# Patient Record
Sex: Female | Born: 1975 | Race: Black or African American | Hispanic: No | Marital: Married | Smoking: Former smoker
Health system: Southern US, Community
[De-identification: ages and names within clinical notes are randomized; demographics above are authoritative.]

## PROBLEM LIST (undated history)

## (undated) DIAGNOSIS — E559 Vitamin D deficiency, unspecified: Secondary | ICD-10-CM

## (undated) DIAGNOSIS — O139 Gestational [pregnancy-induced] hypertension without significant proteinuria, unspecified trimester: Secondary | ICD-10-CM

## (undated) DIAGNOSIS — R87629 Unspecified abnormal cytological findings in specimens from vagina: Secondary | ICD-10-CM

## (undated) DIAGNOSIS — F419 Anxiety disorder, unspecified: Secondary | ICD-10-CM

## (undated) DIAGNOSIS — M1990 Unspecified osteoarthritis, unspecified site: Secondary | ICD-10-CM

## (undated) DIAGNOSIS — E7800 Pure hypercholesterolemia, unspecified: Secondary | ICD-10-CM

## (undated) HISTORY — PX: LAPAROSCOPY: SHX197

## (undated) HISTORY — PX: BREAST BIOPSY: SHX20

## (undated) HISTORY — DX: Unspecified osteoarthritis, unspecified site: M19.90

## (undated) HISTORY — PX: TUBAL LIGATION: SHX77

## (undated) HISTORY — DX: Gestational (pregnancy-induced) hypertension without significant proteinuria, unspecified trimester: O13.9

## (undated) HISTORY — PX: KNEE ARTHROSCOPY WITH ANTERIOR CRUCIATE LIGAMENT (ACL) REPAIR: SHX5644

## (undated) HISTORY — DX: Anxiety disorder, unspecified: F41.9

## (undated) HISTORY — DX: Unspecified abnormal cytological findings in specimens from vagina: R87.629

## (undated) HISTORY — PX: ANTERIOR CRUCIATE LIGAMENT REPAIR: SHX115

## (undated) HISTORY — PX: BILATERAL SALPINGECTOMY: SHX5743

---

## 1998-09-21 ENCOUNTER — Emergency Department (HOSPITAL_COMMUNITY): Admission: EM | Admit: 1998-09-21 | Discharge: 1998-09-21 | Payer: Self-pay | Admitting: Emergency Medicine

## 1998-09-21 ENCOUNTER — Encounter: Payer: Self-pay | Admitting: Emergency Medicine

## 1998-12-11 ENCOUNTER — Emergency Department (HOSPITAL_COMMUNITY): Admission: EM | Admit: 1998-12-11 | Discharge: 1998-12-11 | Payer: Self-pay | Admitting: *Deleted

## 1998-12-11 ENCOUNTER — Encounter: Payer: Self-pay | Admitting: Emergency Medicine

## 1999-05-27 ENCOUNTER — Other Ambulatory Visit: Admission: RE | Admit: 1999-05-27 | Discharge: 1999-05-27 | Payer: Self-pay | Admitting: Obstetrics

## 1999-10-07 ENCOUNTER — Emergency Department (HOSPITAL_COMMUNITY): Admission: EM | Admit: 1999-10-07 | Discharge: 1999-10-07 | Payer: Self-pay | Admitting: Emergency Medicine

## 2000-01-17 ENCOUNTER — Encounter: Payer: Self-pay | Admitting: Emergency Medicine

## 2000-01-17 ENCOUNTER — Emergency Department (HOSPITAL_COMMUNITY): Admission: EM | Admit: 2000-01-17 | Discharge: 2000-01-17 | Payer: Self-pay | Admitting: Emergency Medicine

## 2000-08-14 ENCOUNTER — Emergency Department (HOSPITAL_COMMUNITY): Admission: EM | Admit: 2000-08-14 | Discharge: 2000-08-14 | Payer: Self-pay | Admitting: Emergency Medicine

## 2000-08-21 ENCOUNTER — Emergency Department (HOSPITAL_COMMUNITY): Admission: EM | Admit: 2000-08-21 | Discharge: 2000-08-21 | Payer: Self-pay | Admitting: Emergency Medicine

## 2001-08-22 ENCOUNTER — Emergency Department (HOSPITAL_COMMUNITY): Admission: EM | Admit: 2001-08-22 | Discharge: 2001-08-22 | Payer: Self-pay | Admitting: Emergency Medicine

## 2001-08-23 ENCOUNTER — Encounter: Payer: Self-pay | Admitting: Emergency Medicine

## 2001-08-27 ENCOUNTER — Encounter: Payer: Self-pay | Admitting: Urology

## 2001-08-27 ENCOUNTER — Encounter: Admission: RE | Admit: 2001-08-27 | Discharge: 2001-08-27 | Payer: Self-pay | Admitting: Urology

## 2002-10-21 ENCOUNTER — Emergency Department (HOSPITAL_COMMUNITY): Admission: EM | Admit: 2002-10-21 | Discharge: 2002-10-21 | Payer: Self-pay | Admitting: Emergency Medicine

## 2002-10-25 ENCOUNTER — Encounter: Payer: Self-pay | Admitting: Emergency Medicine

## 2002-10-25 ENCOUNTER — Emergency Department (HOSPITAL_COMMUNITY): Admission: EM | Admit: 2002-10-25 | Discharge: 2002-10-25 | Payer: Self-pay | Admitting: Emergency Medicine

## 2002-11-12 ENCOUNTER — Emergency Department (HOSPITAL_COMMUNITY): Admission: EM | Admit: 2002-11-12 | Discharge: 2002-11-12 | Payer: Self-pay | Admitting: Emergency Medicine

## 2002-11-12 ENCOUNTER — Encounter: Payer: Self-pay | Admitting: Emergency Medicine

## 2002-11-14 ENCOUNTER — Encounter: Admission: RE | Admit: 2002-11-14 | Discharge: 2002-11-14 | Payer: Self-pay | Admitting: Obstetrics and Gynecology

## 2002-11-24 ENCOUNTER — Encounter: Payer: Self-pay | Admitting: Family Medicine

## 2002-11-24 ENCOUNTER — Inpatient Hospital Stay (HOSPITAL_COMMUNITY): Admission: AD | Admit: 2002-11-24 | Discharge: 2002-11-24 | Payer: Self-pay | Admitting: Family Medicine

## 2002-11-26 ENCOUNTER — Encounter: Admission: RE | Admit: 2002-11-26 | Discharge: 2002-11-26 | Payer: Self-pay | Admitting: Obstetrics and Gynecology

## 2002-12-16 ENCOUNTER — Ambulatory Visit (HOSPITAL_COMMUNITY): Admission: RE | Admit: 2002-12-16 | Discharge: 2002-12-16 | Payer: Self-pay | Admitting: Family Medicine

## 2003-04-22 ENCOUNTER — Encounter (INDEPENDENT_AMBULATORY_CARE_PROVIDER_SITE_OTHER): Payer: Self-pay | Admitting: Specialist

## 2003-04-22 ENCOUNTER — Encounter: Admission: RE | Admit: 2003-04-22 | Discharge: 2003-04-22 | Payer: Self-pay | Admitting: Obstetrics and Gynecology

## 2003-05-14 ENCOUNTER — Inpatient Hospital Stay (HOSPITAL_COMMUNITY): Admission: AD | Admit: 2003-05-14 | Discharge: 2003-05-14 | Payer: Self-pay | Admitting: Obstetrics and Gynecology

## 2003-07-11 ENCOUNTER — Inpatient Hospital Stay (HOSPITAL_COMMUNITY): Admission: AD | Admit: 2003-07-11 | Discharge: 2003-07-11 | Payer: Self-pay | Admitting: Obstetrics

## 2003-08-04 ENCOUNTER — Emergency Department (HOSPITAL_COMMUNITY): Admission: EM | Admit: 2003-08-04 | Discharge: 2003-08-04 | Payer: Self-pay | Admitting: Emergency Medicine

## 2003-08-06 ENCOUNTER — Emergency Department (HOSPITAL_COMMUNITY): Admission: EM | Admit: 2003-08-06 | Discharge: 2003-08-06 | Payer: Self-pay | Admitting: Emergency Medicine

## 2003-09-18 ENCOUNTER — Ambulatory Visit (HOSPITAL_COMMUNITY): Admission: RE | Admit: 2003-09-18 | Discharge: 2003-09-18 | Payer: Self-pay | Admitting: Obstetrics

## 2003-10-19 ENCOUNTER — Observation Stay (HOSPITAL_COMMUNITY): Admission: AD | Admit: 2003-10-19 | Discharge: 2003-10-20 | Payer: Self-pay | Admitting: Obstetrics

## 2003-10-19 ENCOUNTER — Encounter: Payer: Self-pay | Admitting: General Surgery

## 2003-11-08 ENCOUNTER — Inpatient Hospital Stay (HOSPITAL_COMMUNITY): Admission: AD | Admit: 2003-11-08 | Discharge: 2003-11-10 | Payer: Self-pay | Admitting: Obstetrics

## 2004-01-13 ENCOUNTER — Ambulatory Visit (HOSPITAL_COMMUNITY): Admission: RE | Admit: 2004-01-13 | Discharge: 2004-01-13 | Payer: Self-pay | Admitting: Obstetrics

## 2004-02-18 ENCOUNTER — Emergency Department (HOSPITAL_COMMUNITY): Admission: EM | Admit: 2004-02-18 | Discharge: 2004-02-18 | Payer: Self-pay | Admitting: Family Medicine

## 2004-02-28 ENCOUNTER — Emergency Department (HOSPITAL_COMMUNITY): Admission: EM | Admit: 2004-02-28 | Discharge: 2004-02-28 | Payer: Self-pay | Admitting: Family Medicine

## 2004-03-12 ENCOUNTER — Emergency Department (HOSPITAL_COMMUNITY): Admission: EM | Admit: 2004-03-12 | Discharge: 2004-03-12 | Payer: Self-pay | Admitting: Emergency Medicine

## 2004-08-20 ENCOUNTER — Emergency Department (HOSPITAL_COMMUNITY): Admission: EM | Admit: 2004-08-20 | Discharge: 2004-08-20 | Payer: Self-pay | Admitting: Emergency Medicine

## 2004-09-01 ENCOUNTER — Emergency Department (HOSPITAL_COMMUNITY): Admission: EM | Admit: 2004-09-01 | Discharge: 2004-09-01 | Payer: Self-pay | Admitting: Family Medicine

## 2005-02-26 ENCOUNTER — Emergency Department (HOSPITAL_COMMUNITY): Admission: EM | Admit: 2005-02-26 | Discharge: 2005-02-26 | Payer: Self-pay | Admitting: Emergency Medicine

## 2005-03-11 ENCOUNTER — Encounter: Payer: Self-pay | Admitting: *Deleted

## 2005-03-11 ENCOUNTER — Observation Stay (HOSPITAL_COMMUNITY): Admission: AD | Admit: 2005-03-11 | Discharge: 2005-03-12 | Payer: Self-pay | Admitting: Obstetrics

## 2005-06-14 ENCOUNTER — Emergency Department (HOSPITAL_COMMUNITY): Admission: EM | Admit: 2005-06-14 | Discharge: 2005-06-14 | Payer: Self-pay | Admitting: Family Medicine

## 2006-06-09 ENCOUNTER — Inpatient Hospital Stay (HOSPITAL_COMMUNITY): Admission: AD | Admit: 2006-06-09 | Discharge: 2006-06-09 | Payer: Self-pay | Admitting: Obstetrics

## 2006-08-03 ENCOUNTER — Inpatient Hospital Stay (HOSPITAL_COMMUNITY): Admission: AD | Admit: 2006-08-03 | Discharge: 2006-08-03 | Payer: Self-pay | Admitting: Obstetrics

## 2007-01-24 ENCOUNTER — Emergency Department (HOSPITAL_COMMUNITY): Admission: EM | Admit: 2007-01-24 | Discharge: 2007-01-25 | Payer: Self-pay | Admitting: Emergency Medicine

## 2007-03-01 ENCOUNTER — Emergency Department (HOSPITAL_COMMUNITY): Admission: EM | Admit: 2007-03-01 | Discharge: 2007-03-01 | Payer: Self-pay | Admitting: Emergency Medicine

## 2007-03-08 ENCOUNTER — Encounter: Admission: RE | Admit: 2007-03-08 | Discharge: 2007-03-08 | Payer: Self-pay | Admitting: General Surgery

## 2007-03-19 ENCOUNTER — Emergency Department (HOSPITAL_COMMUNITY): Admission: EM | Admit: 2007-03-19 | Discharge: 2007-03-19 | Payer: Self-pay | Admitting: Emergency Medicine

## 2007-12-12 ENCOUNTER — Emergency Department (HOSPITAL_COMMUNITY): Admission: EM | Admit: 2007-12-12 | Discharge: 2007-12-12 | Payer: Self-pay | Admitting: Emergency Medicine

## 2008-04-14 ENCOUNTER — Ambulatory Visit (HOSPITAL_COMMUNITY): Admission: RE | Admit: 2008-04-14 | Discharge: 2008-04-14 | Payer: Self-pay | Admitting: General Surgery

## 2008-09-28 ENCOUNTER — Emergency Department (HOSPITAL_COMMUNITY): Admission: EM | Admit: 2008-09-28 | Discharge: 2008-09-28 | Payer: Self-pay | Admitting: Emergency Medicine

## 2009-02-11 ENCOUNTER — Inpatient Hospital Stay (HOSPITAL_COMMUNITY): Admission: AD | Admit: 2009-02-11 | Discharge: 2009-02-11 | Payer: Self-pay | Admitting: Obstetrics

## 2009-03-22 ENCOUNTER — Emergency Department (HOSPITAL_COMMUNITY): Admission: EM | Admit: 2009-03-22 | Discharge: 2009-03-22 | Payer: Self-pay | Admitting: Emergency Medicine

## 2009-05-02 ENCOUNTER — Emergency Department (HOSPITAL_COMMUNITY): Admission: EM | Admit: 2009-05-02 | Discharge: 2009-05-02 | Payer: Self-pay | Admitting: Emergency Medicine

## 2009-09-29 ENCOUNTER — Emergency Department (HOSPITAL_COMMUNITY): Admission: EM | Admit: 2009-09-29 | Discharge: 2009-09-29 | Payer: Self-pay | Admitting: Emergency Medicine

## 2009-12-13 ENCOUNTER — Emergency Department (HOSPITAL_COMMUNITY): Admission: EM | Admit: 2009-12-13 | Discharge: 2009-12-13 | Payer: Self-pay | Admitting: Emergency Medicine

## 2010-02-09 ENCOUNTER — Ambulatory Visit (HOSPITAL_BASED_OUTPATIENT_CLINIC_OR_DEPARTMENT_OTHER): Admission: RE | Admit: 2010-02-09 | Discharge: 2010-02-09 | Payer: Self-pay | Admitting: Orthopedic Surgery

## 2010-02-24 ENCOUNTER — Encounter: Admission: RE | Admit: 2010-02-24 | Discharge: 2010-03-05 | Payer: Self-pay | Admitting: Orthopedic Surgery

## 2010-04-06 ENCOUNTER — Encounter: Admission: RE | Admit: 2010-04-06 | Discharge: 2010-05-13 | Payer: Self-pay | Admitting: Orthopedic Surgery

## 2010-10-22 ENCOUNTER — Emergency Department (HOSPITAL_COMMUNITY)
Admission: EM | Admit: 2010-10-22 | Discharge: 2010-10-22 | Payer: Self-pay | Source: Home / Self Care | Admitting: Emergency Medicine

## 2010-10-25 LAB — POCT I-STAT, CHEM 8
BUN: 9 mg/dL (ref 6–23)
Calcium, Ion: 1.19 mmol/L (ref 1.12–1.32)
Chloride: 104 mEq/L (ref 96–112)
Creatinine, Ser: 0.8 mg/dL (ref 0.4–1.2)
Glucose, Bld: 97 mg/dL (ref 70–99)
HCT: 42 % (ref 36.0–46.0)
Hemoglobin: 14.3 g/dL (ref 12.0–15.0)
Potassium: 3.5 mEq/L (ref 3.5–5.1)
Sodium: 140 mEq/L (ref 135–145)
TCO2: 27 mmol/L (ref 0–100)

## 2010-10-25 LAB — DIFFERENTIAL
Basophils Absolute: 0.1 10*3/uL (ref 0.0–0.1)
Basophils Relative: 1 % (ref 0–1)
Eosinophils Absolute: 0.4 10*3/uL (ref 0.0–0.7)
Eosinophils Relative: 5 % (ref 0–5)
Lymphocytes Relative: 36 % (ref 12–46)
Lymphs Abs: 3.3 10*3/uL (ref 0.7–4.0)
Monocytes Absolute: 0.8 10*3/uL (ref 0.1–1.0)
Monocytes Relative: 8 % (ref 3–12)
Neutro Abs: 4.7 10*3/uL (ref 1.7–7.7)
Neutrophils Relative %: 51 % (ref 43–77)

## 2010-10-25 LAB — CBC
HCT: 38.2 % (ref 36.0–46.0)
Hemoglobin: 13.2 g/dL (ref 12.0–15.0)
MCH: 28.4 pg (ref 26.0–34.0)
MCHC: 34.6 g/dL (ref 30.0–36.0)
MCV: 82.3 fL (ref 78.0–100.0)
Platelets: 253 10*3/uL (ref 150–400)
RBC: 4.64 MIL/uL (ref 3.87–5.11)
RDW: 14.1 % (ref 11.5–15.5)
WBC: 9.2 10*3/uL (ref 4.0–10.5)

## 2010-10-25 LAB — URINALYSIS, ROUTINE W REFLEX MICROSCOPIC
Bilirubin Urine: NEGATIVE
Hgb urine dipstick: NEGATIVE
Ketones, ur: NEGATIVE mg/dL
Nitrite: NEGATIVE
Protein, ur: NEGATIVE mg/dL
Specific Gravity, Urine: 1.024 (ref 1.005–1.030)
Urine Glucose, Fasting: NEGATIVE mg/dL
Urobilinogen, UA: 1 mg/dL (ref 0.0–1.0)
pH: 6.5 (ref 5.0–8.0)

## 2010-10-25 LAB — WET PREP, GENITAL: Yeast Wet Prep HPF POC: NONE SEEN

## 2010-10-25 LAB — URINE MICROSCOPIC-ADD ON

## 2010-10-25 LAB — POCT PREGNANCY, URINE: Preg Test, Ur: NEGATIVE

## 2010-10-26 LAB — GC/CHLAMYDIA PROBE AMP, GENITAL
Chlamydia, DNA Probe: NEGATIVE
GC Probe Amp, Genital: POSITIVE — AB

## 2011-01-03 LAB — RAPID STREP SCREEN (MED CTR MEBANE ONLY): Streptococcus, Group A Screen (Direct): NEGATIVE

## 2011-01-09 LAB — URINALYSIS, ROUTINE W REFLEX MICROSCOPIC
Glucose, UA: NEGATIVE mg/dL
pH: 5.5 (ref 5.0–8.0)

## 2011-01-09 LAB — RAPID URINE DRUG SCREEN, HOSP PERFORMED
Barbiturates: NOT DETECTED
Benzodiazepines: NOT DETECTED
Cocaine: NOT DETECTED
Opiates: NOT DETECTED

## 2011-01-09 LAB — CBC
Hemoglobin: 13.8 g/dL (ref 12.0–15.0)
MCHC: 33.7 g/dL (ref 30.0–36.0)
MCV: 87.6 fL (ref 78.0–100.0)
RDW: 14.1 % (ref 11.5–15.5)

## 2011-01-09 LAB — POCT I-STAT, CHEM 8
Calcium, Ion: 1.11 mmol/L — ABNORMAL LOW (ref 1.12–1.32)
Creatinine, Ser: 0.7 mg/dL (ref 0.4–1.2)
Glucose, Bld: 95 mg/dL (ref 70–99)
Hemoglobin: 15.3 g/dL — ABNORMAL HIGH (ref 12.0–15.0)
TCO2: 22 mmol/L (ref 0–100)

## 2011-01-09 LAB — DIFFERENTIAL
Monocytes Relative: 8 % (ref 3–12)
Neutro Abs: 9.3 10*3/uL — ABNORMAL HIGH (ref 1.7–7.7)

## 2011-01-11 LAB — CBC
MCHC: 34.6 g/dL (ref 30.0–36.0)
MCV: 89.2 fL (ref 78.0–100.0)
Platelets: 272 10*3/uL (ref 150–400)
RBC: 4.89 MIL/uL (ref 3.87–5.11)

## 2011-01-11 LAB — WET PREP, GENITAL: Clue Cells Wet Prep HPF POC: NONE SEEN

## 2011-01-11 LAB — URINALYSIS, ROUTINE W REFLEX MICROSCOPIC
Glucose, UA: NEGATIVE mg/dL
Ketones, ur: NEGATIVE mg/dL
Protein, ur: NEGATIVE mg/dL

## 2011-01-11 LAB — URINE MICROSCOPIC-ADD ON

## 2011-02-18 NOTE — Op Note (Signed)
Brittany Smith, Brittany Smith                        ACCOUNT NO.:  [id number]   MEDICAL RECORD NO.:  [id number]                   PATIENT TYPE:  AMB   LOCATION:  SDC                                  FACILITY:  [facility]   PHYSICIAN:  Kathreen Cosier, M.D.           DATE OF BIRTH:  December 22, 1975   DATE OF PROCEDURE:  01/13/2004  DATE OF DISCHARGE:                                 OPERATIVE REPORT   PROCEDURE:  Open laparoscopic tubal sterilization.   DESCRIPTION OF PROCEDURE:  Under general anesthesia the patient in the  lithotomy position, the abdomen, perineum and vagina prepped and draped,  bladder emptied with a straight catheter. A weighted speculum placed in the  vagina and the cervix grasped with a Hulka tenaculum. In the umbilicus a  transverse incision made and carried out through the fascia. The fascia  cleaned, grasped with two Kocher's and the fascia and the peritoneum opened  with Mayo scissors. The sleeve of the trocar inserted intraperitoneally with  3 liters carbon dioxide infused intraperitoneally.  The visual __________  scope inserted. The uterus, tubes and ovaries normal.  Culture probe  inserted through the sleeve and scope. The right tube grasped at one inch  from the __________ and cauterized.  The tube cauterized in a total of four  places moving laterally from the first site of culture.  Procedure done in a  similar fashion on the other side.  The probe was removed, CO2 allowed to  escape from the peritoneal cavity. The fascia closed with one suture of #0  Dexon and the skin closed with subcuticular stitch of 4-0 Monocryl.  The  patient tolerated the procedure well and was taken to the recovery room in  good condition.                                               Kathreen Cosier, M.D.    BAM/MEDQ  D:  01/13/2004  T:  01/13/2004  Job:  [id number]

## 2011-02-18 NOTE — Op Note (Signed)
Brittany Smith, Brittany Smith                        ACCOUNT NO.:  [id number]   MEDICAL RECORD NO.:  [id number]                   PATIENT TYPE:  AMB   LOCATION:  SDC                                  [facility]:  [facility]   PHYSICIAN:  Phil D. Okey Dupre, M.D.                  DATE OF BIRTH:  28-Apr-1976   DATE OF PROCEDURE:  12/16/2002  DATE OF DISCHARGE:                                 OPERATIVE REPORT   PREOPERATIVE DIAGNOSIS:  Right ovarian pain.   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   PROCEDURE:  Diagnostic laparoscopy and lysis of pelvic adhesions.   SURGEON:  Javier Glazier. Okey Dupre, M.D.   INDICATIONS FOR PROCEDURE:  The patient is a 35 year old gravida 1, para 1,  child 68 years old; has had severe right lower quadrant pain with point  tenderness since January of this year.  On physical examination the right  ovary was exquisitely tender.  Sonogram revealed a left ovarian cyst but the  right ovary appeared normal.  The patient had no history of pelvic  inflammatory disease or other febrile abdominal illness.   DESCRIPTION OF PROCEDURE:  Under satisfactory general anesthesia, the  patient in semilithotomy position.  The perineum, vagina, and abdomen  prepped and draped in the usual sterile manner.  Foley catheter was placed  in the urinary bladder and  the anterior lip of the cervix was grasped with  a single-tooth tenaculum.  An acorn cannula was placed in the cervix and  attached to the tenaculum for mobilization of the uterus.  Veress needle  inserted in the peritoneal cavity at the lower pole of the umbilicus  proximally.  We used carbon dioxide slowly insufflated in the peritoneal  cavity __________ abdominal wall.  A 1 cm transverse incision made just  below the umbilicus and laparoscopic trocar inserted into peritoneal cavity.  Trocar was removed from the sleeve and laparoscope inserted.  The pelvic  organs were easily visualized.  The left  adnexa was bound down significantly  with pelvic adhesions, typical picture of pelvic inflammatory disease;  however, the patient had been asymptomatic.  The right ovary also had an  inflammatory cyst on the surface and marked adhesions that trapped the ovary  against the right pelvic wall.  By blunt dissection for the most part, and  using a bipolar dissector with a Kleppinger, these were broken up and the  cyst was reduced with the bipolar Kleppinger.  The ovary at end of this time  was free and there were no significant adhesions obvious on the right side.  The other side was asymptomatic. Bowel was attached to that area so it was  decided not to do any further dissecting since the patient was asymptomatic  on the left side.  The area was observed for any injury to bowel or other  viscus, none was noted.  The scope was  removed from the sleeve and as much  CO2 as possible expressed from the sleeve and the sleeve removed.  The  incision was closed with a 3-0 Vicryl suture, closing the fascia and running  subcuticular to close the incision.  The incision was further reinforced  with Durabond and the patient was transferred to the recovery room in stable  condition after the tenaculum and acorn cannula were removed from the  vagina.   PLAN:  Treat the patient symptomatology and have her return to clinic in one  to two weeks.                                               Phil D. Okey Dupre, M.D.    PDR/MEDQ  D:  12/16/2002  T:  12/16/2002  Job:  [id number]

## 2011-02-18 NOTE — Op Note (Signed)
Brittany Smith, Brittany Smith              ACCOUNT NO.:  [id number]   MEDICAL RECORD NO.:  [id number]          PATIENT TYPE:  INP   LOCATION:  9302                          FACILITY:  [facility]   PHYSICIAN:  Kathreen Cosier, M.D.DATE OF BIRTH:  02/13/1976   DATE OF PROCEDURE:  03/11/2005  DATE OF DISCHARGE:                                 OPERATIVE REPORT   PREOPERATIVE DIAGNOSIS:  Ruptured ectopic pregnancy.   POSTOPERATIVE DIAGNOSIS:  Ruptured ectopic pregnancy.   OPERATION/PROCEDURE:  Bilateral salpingectomy.   DESCRIPTION OF PROCEDURE:  Under general anesthesia with the patient in the  supine position, abdomen was prepped and draped in the usual sterile manner.  Bladder entered with Foley catheter.  A transverse suprapubic mini  laparotomy was made, carried down to the rectus fascia.  The fascia was  cleaned and incised for the length of the incision, recti muscles retracted  laterally.  Peritoneum incised.  It was noted she had a moderate amount of  free blood in her peritoneal cavity, approximately 200 mL.  Uterus normal.  The left tube was noted to be separated where the laparoscopic tubal was  performed.  The tube was distended with an ectopic pregnancy.  Kelly clamps  were placed across the broad ligament, below the portion of the tube, and  the tube transected.  Hemostasis was achieved with interrupted sutures of #0  chromic.  The patient desired bilateral salpingectomy and the right tube  also showed defects of her having a tubal ligation.  Kelly clamps placed  across the base of the broad ligament in series and the tube removed.  The  ovaries were normal.  There was a right corpus luteum cyst present.  Hemostasis was achieved with interrupted sutures of #0 Vicryl.  Operative  site was dry.  Lap and sponge counts correct.  Abdomen closed in layers,  peritoneum with continuous suture of #0 chromic, fascia with continuous  suture of 2-0 Dexon, and the skin closed with subcuticular  stitch of 4-0  Monocryl.  Blood loss 200 mL.       BAM/MEDQ  D:  03/11/2005  T:  03/12/2005  Job:  [id number]

## 2011-07-08 LAB — URINALYSIS, ROUTINE W REFLEX MICROSCOPIC
Bilirubin Urine: NEGATIVE
Glucose, UA: NEGATIVE mg/dL
Hgb urine dipstick: NEGATIVE
Ketones, ur: NEGATIVE mg/dL
Protein, ur: NEGATIVE mg/dL

## 2011-07-08 LAB — D-DIMER, QUANTITATIVE: D-Dimer, Quant: <0.22 ug/mL-FEU (ref 0.00–0.48)

## 2011-07-08 LAB — POCT PREGNANCY, URINE: Preg Test, Ur: NEGATIVE

## 2011-08-10 ENCOUNTER — Emergency Department (HOSPITAL_COMMUNITY)
Admission: EM | Admit: 2011-08-10 | Discharge: 2011-08-10 | Disposition: A | Discharge status: Home / Self Care | Payer: Self-pay | Attending: Emergency Medicine | Admitting: Emergency Medicine

## 2011-08-10 ENCOUNTER — Encounter: Payer: Self-pay | Admitting: Adult Health

## 2011-08-10 DIAGNOSIS — R269 Unspecified abnormalities of gait and mobility: Secondary | ICD-10-CM | POA: Insufficient documentation

## 2011-08-10 DIAGNOSIS — F17200 Nicotine dependence, unspecified, uncomplicated: Secondary | ICD-10-CM | POA: Insufficient documentation

## 2011-08-10 DIAGNOSIS — M25469 Effusion, unspecified knee: Secondary | ICD-10-CM | POA: Insufficient documentation

## 2011-08-10 DIAGNOSIS — M25569 Pain in unspecified knee: Secondary | ICD-10-CM | POA: Insufficient documentation

## 2011-08-10 DIAGNOSIS — M25562 Pain in left knee: Secondary | ICD-10-CM

## 2011-08-10 MED ORDER — TRAMADOL HCL 50 MG PO TABS: 50.0000 mg | ORAL_TABLET | Freq: Four times a day (QID) | ORAL | Status: AC | PRN

## 2011-08-10 NOTE — ED Notes (Signed)
Hx of sx on left knee one year ago. Yesterday knee began to have pain with weight bearing and "popping out of place" pt has ace bandage wrap and CMS intact

## 2011-08-10 NOTE — ED Provider Notes (Signed)
History     CSN: [id number] Arrival date & time: 08/10/2011  3:36 PM   First MD Initiated Contact with Patient 08/10/11 1700      Chief Complaint  Patient presents with  . Knee Pain    (Consider location/radiation/quality/duration/timing/severity/associated sxs/prior treatment) Patient is a 35 y.o. female presenting with knee pain. The history is provided by the patient.  Knee Pain This is a new problem. The current episode started in the past 7 days. The problem occurs constantly. The problem has been gradually worsening. Associated symptoms include joint swelling. Pertinent negatives include no chills, fever, numbness or weakness. The symptoms are aggravated by walking and standing. She has tried NSAIDs for the symptoms. The treatment provided mild relief.  Pain to left knee. Pt with hx ACL repair to same knee 1 year ago but does not remember who performed the surgery. No new injury. Reports increased pain since yesterday to the posterior and medial knee with sensation of instability and "knee giving out"  History reviewed. No pertinent past medical history.  Past Surgical History  Procedure Date  . Anterior cruciate ligament repair     History reviewed. No pertinent family history.  History  Substance Use Topics  . Smoking status: Current Everyday Smoker  . Smokeless tobacco: Not on file  . Alcohol Use: Yes    Review of Systems  Constitutional: Negative for fever and chills.  Musculoskeletal: Positive for joint swelling and gait problem.  Neurological: Negative for weakness and numbness.  All other systems reviewed and are negative.    Allergies  Ceftriaxone  Home Medications  No current outpatient prescriptions on file.  BP 123/97  Pulse 64  Temp(Src) 98.4 F (36.9 C) (Oral)  Resp 20  SpO2 100%  Physical Exam  Constitutional: She is oriented to person, place, and time. She appears well-developed and well-nourished. No distress.  HENT:  Head:  Normocephalic and atraumatic.  Eyes: Pupils are equal, round, and reactive to light.  Neck: Normal range of motion. Neck supple.  Cardiovascular: Normal rate and regular rhythm.   Pulmonary/Chest: Effort normal and breath sounds normal.  Abdominal: Soft. She exhibits no distension. There is no tenderness.  Musculoskeletal: Normal range of motion. She exhibits no edema.       Left knee: She exhibits no LCL laxity and no MCL laxity. tenderness found. Medial joint line tenderness noted.       Legs: Neurological: She is alert and oriented to person, place, and time. No cranial nerve deficit.  Skin: Skin is warm and dry. No rash noted. No erythema.  Psychiatric: She has a normal mood and affect. Her behavior is normal.    ED Course  Procedures (including critical care time)  Labs Reviewed - No data to display No results found.   1. Knee pain, left       MDM  Pt with gradually increasing knee pain and hx of surgery to same knee. Negative ottawa criteria- no imaging study indicated. Have advised f/u with surgeon for re-evaluation.        Elwyn Reach [place], [place] 08/10/11 1739

## 2011-08-10 NOTE — ED Provider Notes (Signed)
Medical screening examination/treatment/procedure(s) were performed by non-physician practitioner and as supervising physician I was immediately available for consultation/collaboration.   Nelia Shi, MD 08/10/11 2233

## 2011-11-06 ENCOUNTER — Encounter (HOSPITAL_COMMUNITY): Payer: Self-pay | Admitting: *Deleted

## 2011-11-06 ENCOUNTER — Emergency Department (HOSPITAL_COMMUNITY)
Admission: EM | Admit: 2011-11-06 | Discharge: 2011-11-07 | Disposition: A | Discharge status: Home / Self Care | Payer: Self-pay | Attending: Emergency Medicine | Admitting: Emergency Medicine

## 2011-11-06 ENCOUNTER — Emergency Department (HOSPITAL_COMMUNITY): Payer: Self-pay

## 2011-11-06 DIAGNOSIS — K529 Noninfective gastroenteritis and colitis, unspecified: Secondary | ICD-10-CM

## 2011-11-06 DIAGNOSIS — K5289 Other specified noninfective gastroenteritis and colitis: Secondary | ICD-10-CM | POA: Insufficient documentation

## 2011-11-06 DIAGNOSIS — R197 Diarrhea, unspecified: Secondary | ICD-10-CM | POA: Insufficient documentation

## 2011-11-06 DIAGNOSIS — R109 Unspecified abdominal pain: Secondary | ICD-10-CM | POA: Insufficient documentation

## 2011-11-06 DIAGNOSIS — R1110 Vomiting, unspecified: Secondary | ICD-10-CM | POA: Insufficient documentation

## 2011-11-06 LAB — POCT I-STAT, CHEM 8
HCT: 42 % (ref 36.0–46.0)
Hemoglobin: 14.3 g/dL (ref 12.0–15.0)
Potassium: 3.7 mEq/L (ref 3.5–5.1)
Sodium: 142 mEq/L (ref 135–145)
TCO2: 26 mmol/L (ref 0–100)

## 2011-11-06 NOTE — ED Provider Notes (Signed)
History     CSN: [id number]  Arrival date & time 11/06/11  2243   First MD Initiated Contact with Patient 11/06/11 2258      Chief Complaint  Patient presents with  . Abdominal Pain    (Consider location/radiation/quality/duration/timing/severity/associated sxs/prior treatment) HPI Complains of diarrhea multiple episodes upon awakening 6 AM today with one episode of vomiting, and gassy abdominal diffuse and nonradiating pain treated with Pepto-Bismol with partial relief last episode of diarrhea 5 hours ago no nausea at present presently discomfort is minimal. No fever no other associated symptoms History reviewed. No pertinent past medical history.  Past Surgical History  Procedure Date  . Anterior cruciate ligament repair    salpingectomy Laparoscopy  History reviewed. No pertinent family history.  History  Substance Use Topics  . Smoking status: Current Everyday Smoker  . Smokeless tobacco: Not on file  . Alcohol Use: Yes   no illicit drug use  OB History    Grav Para Term Preterm Abortions TAB SAB Ect Mult Living                  Review of Systems  Constitutional: Negative.   HENT: Negative.   Respiratory: Negative.   Cardiovascular: Negative.   Gastrointestinal: Positive for vomiting, abdominal pain and diarrhea. Negative for blood in stool.  Musculoskeletal: Negative.   Skin: Negative.   Neurological: Negative.   Hematological: Negative.   Psychiatric/Behavioral: Negative.     Allergies  Ceftriaxone  Home Medications   Current Outpatient Rx  Name Route Sig Dispense Refill  . BISMUTH SUBSALICYLATE 262 MG/15ML PO SUSP Oral Take 30 mLs by mouth every 6 (six) hours as needed.      BP 114/71  Pulse 78  Temp(Src) 97.8 F (36.6 C) (Oral)  Resp 18  SpO2 99%  LMP 10/30/2011  Physical Exam  Nursing note and vitals reviewed. Constitutional: She appears well-developed and well-nourished.  HENT:  Head: Normocephalic and atraumatic.  Eyes:  Conjunctivae are normal. Pupils are equal, round, and reactive to light.  Neck: Neck supple. No tracheal deviation present. No thyromegaly present.  Cardiovascular: Normal rate and regular rhythm.   No murmur heard. Pulmonary/Chest: Effort normal and breath sounds normal.  Abdominal: Soft. She exhibits no distension. There is no tenderness.  Musculoskeletal: Normal range of motion. She exhibits no edema and no tenderness.  Neurological: She is alert. Coordination normal.  Skin: Skin is warm and dry. No rash noted.  Psychiatric: She has a normal mood and affect.    ED Course  Procedures (including critical care time)  Labs Reviewed - No data to display No results found. 2:05 AM patient resting comfortably no distress. Feels okay to go home  No diagnosis found. Results for orders placed during the hospital encounter of 11/06/11  URINALYSIS, ROUTINE W REFLEX MICROSCOPIC      Component Value Range   Color, Urine YELLOW  YELLOW    APPearance CLOUDY (*) CLEAR    Specific Gravity, Urine 1.026  1.005 - 1.030    pH 6.0  5.0 - 8.0    Glucose, UA NEGATIVE  NEGATIVE (mg/dL)   Hgb urine dipstick NEGATIVE  NEGATIVE    Bilirubin Urine SMALL (*) NEGATIVE    Ketones, ur 15 (*) NEGATIVE (mg/dL)   Protein, ur NEGATIVE  NEGATIVE (mg/dL)   Urobilinogen, UA 1.0  0.0 - 1.0 (mg/dL)   Nitrite NEGATIVE  NEGATIVE    Leukocytes, UA SMALL (*) NEGATIVE   POCT I-STAT, CHEM 8  Component Value Range   Sodium 142  135 - 145 (mEq/L)   Potassium 3.7  3.5 - 5.1 (mEq/L)   Chloride 105  96 - 112 (mEq/L)   BUN 7  6 - 23 (mg/dL)   Creatinine, Ser 1.61  0.50 - 1.10 (mg/dL)   Glucose, Bld 79  70 - 99 (mg/dL)   Calcium, Ion 0.96  1.12 - 1.32 (mmol/L)   TCO2 26  0 - 100 (mmol/L)   Hemoglobin 14.3  12.0 - 15.0 (g/dL)   HCT 04.5  40.9 - 81.1 (%)  POCT PREGNANCY, URINE      Component Value Range   Preg Test, Ur NEGATIVE  NEGATIVE   URINE MICROSCOPIC-ADD ON      Component Value Range   Squamous Epithelial /  LPF MANY (*) RARE    WBC, UA 0-2  <3 (WBC/hpf)   RBC / HPF 0-2  <3 (RBC/hpf)   Bacteria, UA MANY (*) RARE    Urine-Other MUCOUS PRESENT     Dg Abd Acute W/chest  11/06/2011  *RADIOLOGY REPORT*  Clinical Data: Abdominal pain  ACUTE ABDOMEN SERIES (ABDOMEN 2 VIEW & CHEST 1 VIEW)  Comparison: 09/28/2008 and earlier studies  Findings: Lungs are clear.  Heart size normal.  No effusion.  No free air.  Small bowel decompressed.  Normal distribution of gas and stool throughout colon.  Left pelvic phleboliths.  Regional bones unremarkable.  IMPRESSION:  1.  Normal bowel gas pattern. 2.  No free air. 3.  No acute cardiopulmonary disease.  Original Report Authenticated By: Osa Craver, M.D.      MDM  Symptoms consistent with gastroenteritis. No signs of dehydration Plan continue Pepto-Bismol as needed return or see Dr. Gaynell Face if not better in 2 or 3 days Diagnosis gastroenteritis       Doug Sou, MD 11/07/11 [phone]

## 2011-11-06 NOTE — ED Notes (Signed)
Abdominal pain  Since 0600am today with diarrhea and some vomiting.  lmp  One week ago

## 2011-11-07 LAB — URINE MICROSCOPIC-ADD ON

## 2011-11-07 LAB — URINALYSIS, ROUTINE W REFLEX MICROSCOPIC
Nitrite: NEGATIVE
Specific Gravity, Urine: 1.026 (ref 1.005–1.030)
Urobilinogen, UA: 1 mg/dL (ref 0.0–1.0)

## 2011-11-07 LAB — POCT PREGNANCY, URINE: Preg Test, Ur: NEGATIVE

## 2011-11-07 NOTE — ED Notes (Signed)
Pt stated understanding of discharge instructions.

## 2012-08-27 ENCOUNTER — Encounter (HOSPITAL_COMMUNITY): Payer: Self-pay | Admitting: Emergency Medicine

## 2012-08-27 ENCOUNTER — Emergency Department (HOSPITAL_COMMUNITY)
Admission: EM | Admit: 2012-08-27 | Discharge: 2012-08-27 | Disposition: A | Discharge status: Home / Self Care | Payer: Self-pay | Attending: Emergency Medicine | Admitting: Emergency Medicine

## 2012-08-27 DIAGNOSIS — R209 Unspecified disturbances of skin sensation: Secondary | ICD-10-CM | POA: Insufficient documentation

## 2012-08-27 DIAGNOSIS — G629 Polyneuropathy, unspecified: Secondary | ICD-10-CM

## 2012-08-27 DIAGNOSIS — H409 Unspecified glaucoma: Secondary | ICD-10-CM | POA: Insufficient documentation

## 2012-08-27 DIAGNOSIS — F17200 Nicotine dependence, unspecified, uncomplicated: Secondary | ICD-10-CM | POA: Insufficient documentation

## 2012-08-27 DIAGNOSIS — G609 Hereditary and idiopathic neuropathy, unspecified: Secondary | ICD-10-CM | POA: Insufficient documentation

## 2012-08-27 MED ORDER — PREDNISONE 20 MG PO TABS: 40.0000 mg | ORAL_TABLET | Freq: Every day | ORAL | Status: DC

## 2012-08-27 MED ORDER — PREDNISONE 20 MG PO TABS
40.0000 mg | ORAL_TABLET | Freq: Once | ORAL | Status: AC
  Administered 2012-08-27: 40 mg via ORAL
  Filled 2012-08-27: qty 2

## 2012-08-27 NOTE — Progress Notes (Signed)
Orthopedic Tech Progress Note Patient Details:  Brittany Smith 1976-06-01 [id number]  Ortho Devices Type of Ortho Device: Velcro wrist splint Ortho Device/Splint Location: RIGHT WRIST SPLINT Ortho Device/Splint Interventions: Application   Cammer, Mickie Bail 08/27/2012, 1:47 PM

## 2012-08-27 NOTE — ED Provider Notes (Signed)
History    This chart was scribed for Brittany Smith. Brittany Lamas, MD, MD by Smitty Pluck, ED Scribe. The patient was seen in room TR08C and the patient's care was started at 1:32PM.   CSN: [id number]  Arrival date & time 08/27/12  1306   None     Chief Complaint  Patient presents with  . Elbow Pain    (Consider location/radiation/quality/duration/timing/severity/associated sxs/prior treatment) The history is provided by the patient. No language interpreter was used.   [name] is a 36 y.o. female who presents to the Emergency Department complaining of constant, moderate numbness in right arm onset 2 weeks ago. Pt reports that she has "pins and needles" sensation. During onset she reports that she awoke with right arm numbness due to her laying on her right arm but the symptoms went away. Later that morning she reports the numbness in right arm returned. She reports that she has injury to right shoulder 11 years ago and she has pain from it intermittently. She denies typing on computer. Pt reports smoking cigarettes. Denies injuring neck recently and any other pain.     History reviewed. No pertinent past medical history.  Past Surgical History  Procedure Date  . Anterior cruciate ligament repair   . Tubal ligation   . Knee arthroscopy with anterior cruciate ligament (acl) repair     History reviewed. No pertinent family history.  History  Substance Use Topics  . Smoking status: Current Every Day Smoker  . Smokeless tobacco: Not on file  . Alcohol Use: Yes     Comment: occasionally    OB History    Grav Para Term Preterm Abortions TAB SAB Ect Mult Living                  Review of Systems  Constitutional: Negative for fever and chills.  Respiratory: Negative for shortness of breath.   Gastrointestinal: Negative for nausea and vomiting.  Neurological: Positive for numbness. Negative for weakness.    Allergies  Ceftriaxone  Home Medications   Current Outpatient Rx    Name  Route  Sig  Dispense  Refill  . PREDNISONE 20 MG PO TABS   Oral   Take 2 tablets (40 mg total) by mouth daily.   14 tablet   0     BP 116/66  Pulse 79  Temp 98 F (36.7 C) (Oral)  Resp 16  SpO2 100%  LMP 08/03/2012  Physical Exam  Nursing note and vitals reviewed. Constitutional: She is oriented to person, place, and time. She appears well-developed and well-nourished. No distress.  HENT:  Head: Normocephalic and atraumatic.  Eyes: EOM are normal.  Neck: Neck supple. No tracheal deviation present.  Cardiovascular: Normal rate.   Pulmonary/Chest: Effort normal. No respiratory distress.  Musculoskeletal: Normal range of motion. She exhibits no tenderness.       Cap refill brisk Radial pulse is +2 No deformity   Neurological: She is alert and oriented to person, place, and time. No cranial nerve deficit. She exhibits normal muscle tone. Coordination and gait normal. GCS eye subscore is 4. GCS verbal subscore is 5. GCS motor subscore is 6.  Skin: Skin is warm and dry.  Psychiatric: She has a normal mood and affect. Her behavior is normal.    ED Course  Procedures (including critical care time) DIAGNOSTIC STUDIES: Oxygen Saturation is 100% on room air, normal by my interpretation.    COORDINATION OF CARE: 1:38 PM Discussed ED treatment with pt  Labs Reviewed - No data to display No results found.   1. Peripheral neuropathy       MDM  I personally performed the services described in this documentation, which was scribed in my presence. The recorded information has been reviewed and is accurate.   Pt si right handed.  No weakness here to medial, ulnar or radial distributions.  Numbness worse with wrist flexion for prolonged period of time.  Pt is right handed, works with both arms frequently.  No trauma.  No neck pain.  No prior neck trauma.  Pulse and cap refill to right hand is normal.  Pt smokes, encouraged to stop.  Will place in wrist brace and  treat conservatively for carpal tunnel or other peripheral neuropathy.  Refer to hand surgeon and neurology  Brittany Smith. Brittany Lamas, MD 08/28/12 [phone]

## 2012-08-27 NOTE — ED Notes (Signed)
Pt c/o right elbow pain x 2 weeks and "falling asleep" while sleeping; pt denies obvious injury

## 2012-08-27 NOTE — ED Notes (Signed)
Pt c/o right arm numbness x 2 weeks. States it "it feels weak". No known injury.

## 2012-08-27 NOTE — Discharge Instructions (Signed)
Peripheral Neuropathy Peripheral neuropathy is a common disorder of your nerves resulting from damage. CAUSES  This disorder may be caused by a disease of the nerves or illness. Many neuropathies have well known causes such as:  Diabetes. This is one of the most common causes.   Uremia.   AIDS.   Nutritional deficiencies.   Other causes include mechanical pressures. These may be from:   Compression.   Injury.   Contusions or bruises.   Fracture or dislocated bones.   Pressure involving the nerves close to the surface. Nerves such as the ulnar, or radial can be injured by prolonged use of crutches.  Other injuries may come from:  Tumor.   Hemorrhage or bleeding into a nerve.   Exposure to cold or radiation.   Certain medicines or toxic substances (rare).   Vascular or collagen disorders such as:   Atherosclerosis.   Systemic lupus erythematosus.   Scleroderma.   Sarcoidosis.   Rheumatoid arthritis.   Polyarteritis nodosa.   A large number of cases are of unknown cause.  SYMPTOMS  Common problems include:  Weakness.   Numbness.   Abnormal sensations (paresthesia) such as:   Burning.   Tickling.   Pricking.   Tingling.   Pain in the arms, hands, legs and/or feet.  TREATMENT  Therapy for this disorder differs depending on the cause. It may vary from medical treatment with medications or physical therapy among others.   For example, therapy for this disorder caused by diabetes involves control of the diabetes.   In cases where a tumor or ruptured disc is the cause, therapy may involve surgery. This would be to remove the tumor or to repair the ruptured disc.   In entrapment or compression neuropathy, treatment may consist of splinting or surgical decompression of the ulnar or median nerves. A common example of entrapment neuropathy is carpal tunnel syndrome. This has become more common because of the increasing use of computers.   Peroneal and  radial compression neuropathies may require avoidance of pressure.   Physical therapy and/or splints may be useful in preventing contractures. This is a condition in which shortened muscles around joints cause abnormal and sometimes painful positioning of the joints.  Document Released: 09/09/2002 Document Revised: 06/01/2011 Document Reviewed: 09/19/2005 [employer] Patient Information 2012 [employer]. 

## 2012-12-03 ENCOUNTER — Emergency Department (HOSPITAL_COMMUNITY): Payer: Self-pay

## 2012-12-03 ENCOUNTER — Emergency Department (HOSPITAL_COMMUNITY)
Admission: EM | Admit: 2012-12-03 | Discharge: 2012-12-03 | Disposition: A | Discharge status: Home / Self Care | Payer: Self-pay | Attending: Emergency Medicine | Admitting: Emergency Medicine

## 2012-12-03 ENCOUNTER — Encounter (HOSPITAL_COMMUNITY): Payer: Self-pay | Admitting: Emergency Medicine

## 2012-12-03 DIAGNOSIS — Z3202 Encounter for pregnancy test, result negative: Secondary | ICD-10-CM | POA: Insufficient documentation

## 2012-12-03 DIAGNOSIS — A5909 Other urogenital trichomoniasis: Secondary | ICD-10-CM | POA: Insufficient documentation

## 2012-12-03 DIAGNOSIS — N730 Acute parametritis and pelvic cellulitis: Secondary | ICD-10-CM | POA: Insufficient documentation

## 2012-12-03 DIAGNOSIS — Z87891 Personal history of nicotine dependence: Secondary | ICD-10-CM | POA: Insufficient documentation

## 2012-12-03 DIAGNOSIS — R110 Nausea: Secondary | ICD-10-CM | POA: Insufficient documentation

## 2012-12-03 LAB — POCT I-STAT, CHEM 8
BUN: 9 mg/dL (ref 6–23)
Calcium, Ion: 1.15 mmol/L (ref 1.12–1.23)
Chloride: 105 mEq/L (ref 96–112)
Glucose, Bld: 113 mg/dL — ABNORMAL HIGH (ref 70–99)
HCT: 44 % (ref 36.0–46.0)
TCO2: 26 mmol/L (ref 0–100)

## 2012-12-03 LAB — URINALYSIS, ROUTINE W REFLEX MICROSCOPIC
Bilirubin Urine: NEGATIVE
Ketones, ur: NEGATIVE mg/dL
Nitrite: NEGATIVE
Protein, ur: NEGATIVE mg/dL
pH: 8 (ref 5.0–8.0)

## 2012-12-03 LAB — CBC WITH DIFFERENTIAL/PLATELET
Basophils Absolute: 0.1 10*3/uL (ref 0.0–0.1)
Eosinophils Absolute: 0.4 10*3/uL (ref 0.0–0.7)
Eosinophils Relative: 4 % (ref 0–5)
HCT: 39.2 % (ref 36.0–46.0)
MCH: 29 pg (ref 26.0–34.0)
MCHC: 35.2 g/dL (ref 30.0–36.0)
MCV: 82.4 fL (ref 78.0–100.0)
Monocytes Absolute: 0.6 10*3/uL (ref 0.1–1.0)
Platelets: 252 10*3/uL (ref 150–400)
RDW: 14.1 % (ref 11.5–15.5)

## 2012-12-03 LAB — URINE MICROSCOPIC-ADD ON

## 2012-12-03 LAB — BASIC METABOLIC PANEL
CO2: 24 mEq/L (ref 19–32)
Calcium: 9.5 mg/dL (ref 8.4–10.5)
Creatinine, Ser: 0.75 mg/dL (ref 0.50–1.10)
GFR calc non Af Amer: >90 mL/min (ref >90)
Glucose, Bld: 112 mg/dL — ABNORMAL HIGH (ref 70–99)
Sodium: 136 mEq/L (ref 135–145)

## 2012-12-03 LAB — WET PREP, GENITAL

## 2012-12-03 MED ORDER — LIDOCAINE HCL (PF) 1 % IJ SOLN
INTRAMUSCULAR | Status: AC
  Administered 2012-12-03: 1.8 mL
  Filled 2012-12-03: qty 5

## 2012-12-03 MED ORDER — CEFTRIAXONE SODIUM 250 MG IJ SOLR
250.0000 mg | Freq: Once | INTRAMUSCULAR | Status: AC
  Administered 2012-12-03: 250 mg via INTRAMUSCULAR
  Filled 2012-12-03: qty 250

## 2012-12-03 MED ORDER — DOXYCYCLINE HYCLATE 100 MG PO CAPS: 100.0000 mg | ORAL_CAPSULE | Freq: Two times a day (BID) | ORAL | Status: DC

## 2012-12-03 MED ORDER — AZITHROMYCIN 1 G PO PACK
1.0000 g | PACK | Freq: Once | ORAL | Status: AC
  Administered 2012-12-03: 1 g via ORAL
  Filled 2012-12-03: qty 1

## 2012-12-03 MED ORDER — SODIUM CHLORIDE 0.9 % IV SOLN
Freq: Once | INTRAVENOUS | Status: AC
  Administered 2012-12-03: 11:00:00 via INTRAVENOUS

## 2012-12-03 MED ORDER — ONDANSETRON HCL 4 MG/2ML IJ SOLN
4.0000 mg | Freq: Once | INTRAMUSCULAR | Status: AC
  Administered 2012-12-03: 4 mg via INTRAVENOUS
  Filled 2012-12-03: qty 2

## 2012-12-03 MED ORDER — ONDANSETRON HCL 4 MG/2ML IJ SOLN: 4.0000 mg | INTRAMUSCULAR | Status: DC | PRN

## 2012-12-03 MED ORDER — IBUPROFEN 600 MG PO TABS: 600.0000 mg | ORAL_TABLET | Freq: Four times a day (QID) | ORAL | Status: DC | PRN

## 2012-12-03 MED ORDER — IOHEXOL 300 MG/ML  SOLN
100.0000 mL | Freq: Once | INTRAMUSCULAR | Status: AC | PRN
  Administered 2012-12-03: 80 mL via INTRAVENOUS

## 2012-12-03 MED ORDER — METRONIDAZOLE 500 MG PO TABS
2000.0000 mg | ORAL_TABLET | Freq: Once | ORAL | Status: AC
  Administered 2012-12-03: 2000 mg via ORAL
  Filled 2012-12-03: qty 4

## 2012-12-03 MED ORDER — HYDROMORPHONE HCL PF 1 MG/ML IJ SOLN
1.0000 mg | Freq: Once | INTRAMUSCULAR | Status: AC
  Administered 2012-12-03: 1 mg via INTRAVENOUS
  Filled 2012-12-03: qty 1

## 2012-12-03 MED ORDER — IOHEXOL 300 MG/ML  SOLN: 20.0000 mL | INTRAMUSCULAR | Status: AC

## 2012-12-03 MED ORDER — DIPHENHYDRAMINE HCL 50 MG/ML IJ SOLN
25.0000 mg | Freq: Once | INTRAMUSCULAR | Status: AC
  Administered 2012-12-03: 25 mg via INTRAVENOUS
  Filled 2012-12-03: qty 1

## 2012-12-03 MED ORDER — SODIUM CHLORIDE 0.9 % IV BOLUS (SEPSIS)
1000.0000 mL | Freq: Once | INTRAVENOUS | Status: AC
  Administered 2012-12-03: 1000 mL via INTRAVENOUS

## 2012-12-03 MED ORDER — HYDROMORPHONE HCL PF 1 MG/ML IJ SOLN
1.0000 mg | INTRAMUSCULAR | Status: DC | PRN
  Administered 2012-12-03 (×2): 1 mg via INTRAVENOUS
  Filled 2012-12-03 (×2): qty 1

## 2012-12-03 MED ORDER — HYDROCODONE-ACETAMINOPHEN 5-325 MG PO TABS: 1.0000 | ORAL_TABLET | Freq: Four times a day (QID) | ORAL | Status: DC | PRN

## 2012-12-03 NOTE — ED Provider Notes (Addendum)
History     CSN: [id number]  Arrival date & time 12/03/12  0740   First MD Initiated Contact with Patient 12/03/12 0800      Chief Complaint  Patient presents with  . Abdominal Pain    (Consider location/radiation/quality/duration/timing/severity/associated sxs/prior treatment) HPI Comments: 37 yo AA female with history of right ectopic pregnancy and ovarian cysts presents to the ED today with RLQ abdominal pain beginning about 30 minutes prior to arrival.  Pain is described as a sharp, constant pain.  Nothing makes the pain better, while any movements make it worse.  Pain does not radiate.  Pain is 10/10.  She reports having pain similar when she had the ectopic pregnancy that was also located on the right side, for which she had a salpingectomy.  She has had some associated nausea and white, thick vaginal discharge.  Denies fever, chills, vomiting, urinary changes, hematuria, bowel changes, melena, hematochezia, abnormal menses, or breakthrough bleeding.  Patient is a 37 y.o. female presenting with abdominal pain. The history is provided by the patient.  Abdominal Pain Associated symptoms: nausea   Associated symptoms: no chest pain, no constipation, no cough, no diarrhea, no fever, no hematuria, no shortness of breath and no vomiting     History reviewed. No pertinent past medical history.  Past Surgical History  Procedure Laterality Date  . Anterior cruciate ligament repair    . Tubal ligation    . Knee arthroscopy with anterior cruciate ligament (acl) repair      No family history on file.  History  Substance Use Topics  . Smoking status: Former Smoker    Quit date: 10/05/2012  . Smokeless tobacco: Not on file  . Alcohol Use: Yes     Comment: occasionally    OB History   Grav Para Term Preterm Abortions TAB SAB Ect Mult Living                  Review of Systems  Constitutional: Negative for fever and activity change.  HENT: Negative for facial swelling and neck  pain.   Respiratory: Negative for cough, shortness of breath and wheezing.   Cardiovascular: Negative for chest pain.  Gastrointestinal: Positive for nausea and abdominal pain. Negative for vomiting, diarrhea, constipation, blood in stool and abdominal distention.  Genitourinary: Negative for hematuria and difficulty urinating.  Skin: Negative for color change.  Neurological: Negative for speech difficulty.  Hematological: Does not bruise/bleed easily.  Psychiatric/Behavioral: Negative for confusion.    Allergies  Ceftriaxone  Home Medications  No current outpatient prescriptions on file.  BP 141/52  Pulse 67  Temp(Src) 97.8 F (36.6 C) (Oral)  Resp 20  SpO2 100%  LMP 11/19/2012  Physical Exam  Nursing note and vitals reviewed. Constitutional: She is oriented to person, place, and time. She appears well-developed.  HENT:  Head: Normocephalic and atraumatic.  Eyes: Conjunctivae and EOM are normal. Pupils are equal, round, and reactive to light.  Neck: Normal range of motion. Neck supple.  Cardiovascular: Normal rate, regular rhythm, normal heart sounds and intact distal pulses.   No murmur heard. Pulmonary/Chest: Effort normal and breath sounds normal. No respiratory distress. She has no wheezes.  Abdominal: Soft. Bowel sounds are normal. She exhibits no distension. There is tenderness. There is rebound and guarding.  RLQ tenderness with rebound and guarding. Mcburney's positive and Rovsing's positive.  Genitourinary: Vagina normal and uterus normal.  External exam - normal, no lesions Speculum exam: Pt has some white discharge, no blood Bimanual exam:  Patient has + CMT, and right sided adnexal tenderness or fullness and cervical os is closed  Neurological: She is alert and oriented to person, place, and time.  Skin: Skin is warm and dry.    ED Course  Procedures (including critical care time)  Labs Reviewed  BASIC METABOLIC PANEL - Abnormal; Notable for the following:     Glucose, Bld 112 (*)    All other components within normal limits  POCT I-STAT, CHEM 8 - Abnormal; Notable for the following:    Glucose, Bld 113 (*)    All other components within normal limits  WET PREP, GENITAL  GC/CHLAMYDIA PROBE AMP  CBC WITH DIFFERENTIAL  URINALYSIS, ROUTINE W REFLEX MICROSCOPIC  PREGNANCY, URINE   No results found.   No diagnosis found.    MDM  DDx includes: Appendicitis SBO ACS syndrome Aortic Dissection Colitis AAA Tumors Colitis Intra abdominal abscess Thrombosis Mesenteric ischemia Diverticulitis Peritonitis Hernia Nephrolithiasis Pyelonephritis UTI/Cystitis Ovarian cyst TOA Ectopic pregnancy PID STD   Pt comes in with cc of abdominal pain. Pt has RLQ pain, sudden onset. Hx of ectopic pregnancy s/p salpingectomy. Will get [place] to r/o TOA and also get CT scan to r/o appendicitis.    Derwood Kaplan, MD 12/03/12 [phone]  Both [place] and Ct were negative. She had cervical motion tenderness on exam - so we suspect she has PID.  Derwood Kaplan, MD 12/03/12 1201

## 2012-12-03 NOTE — ED Notes (Signed)
Patient transported to CT 

## 2012-12-03 NOTE — ED Notes (Signed)
Patient returned from CT

## 2012-12-03 NOTE — ED Notes (Signed)
No reaction to ceftriaxone.

## 2012-12-03 NOTE — ED Notes (Signed)
Myself, Nanavati, MD and an EMS Paramedic-Student chaperoned Burnette, PA-Student while she was performing a pelvic examination and collecting vaginal specimens; mother was at bedside during procedure

## 2012-12-03 NOTE — ED Notes (Signed)
Patient transported to Ultrasound 

## 2012-12-03 NOTE — ED Notes (Signed)
Pt c/o right lower abdominal pain with nausea onset few minutes ago. Pt tearful, holding onto right lower abdomen area. Pt denies urinary symptoms.

## 2012-12-03 NOTE — ED Notes (Signed)
Pt states she is unable to provide an urine specimen right now because she had to empty her bladder when she was over in Ultrasound getting a test performed; RN and MD aware

## 2012-12-03 NOTE — ED Notes (Signed)
Pt undressed, in gown, on continuous pulse oximetry and blood pressure cuff; family at bedside 

## 2012-12-04 LAB — GC/CHLAMYDIA PROBE AMP: CT Probe RNA: NEGATIVE

## 2013-09-05 ENCOUNTER — Emergency Department (HOSPITAL_COMMUNITY)
Admission: EM | Admit: 2013-09-05 | Discharge: 2013-09-05 | Disposition: A | Discharge status: Home / Self Care | Payer: Self-pay | Attending: Emergency Medicine | Admitting: Emergency Medicine

## 2013-09-05 ENCOUNTER — Encounter (HOSPITAL_COMMUNITY): Payer: Self-pay | Admitting: Emergency Medicine

## 2013-09-05 ENCOUNTER — Emergency Department (HOSPITAL_COMMUNITY): Payer: Self-pay

## 2013-09-05 DIAGNOSIS — X500XXA Overexertion from strenuous movement or load, initial encounter: Secondary | ICD-10-CM | POA: Insufficient documentation

## 2013-09-05 DIAGNOSIS — M25562 Pain in left knee: Secondary | ICD-10-CM

## 2013-09-05 DIAGNOSIS — Z9889 Other specified postprocedural states: Secondary | ICD-10-CM | POA: Insufficient documentation

## 2013-09-05 DIAGNOSIS — S8990XA Unspecified injury of unspecified lower leg, initial encounter: Secondary | ICD-10-CM | POA: Insufficient documentation

## 2013-09-05 DIAGNOSIS — Y939 Activity, unspecified: Secondary | ICD-10-CM | POA: Insufficient documentation

## 2013-09-05 DIAGNOSIS — Z87891 Personal history of nicotine dependence: Secondary | ICD-10-CM | POA: Insufficient documentation

## 2013-09-05 DIAGNOSIS — Y929 Unspecified place or not applicable: Secondary | ICD-10-CM | POA: Insufficient documentation

## 2013-09-05 NOTE — ED Provider Notes (Signed)
CSN: [id number]     Arrival date & time 09/05/13  1233 History  This chart was scribed for non-physician practitioner, Johnnette Gourd, PA-C working with Flint Melter, MD by Greggory Stallion, ED scribe. This patient was seen in room TR05C/TR05C and the patient's care was started at 12:43 PM.   Chief Complaint  Patient presents with  . Knee Pain   The history is provided by the patient. No language interpreter was used.   HPI Comments: Brittany Smith is a 37 y.o. female who presents to the Emergency Department complaining of gradual onset, constant sharp left knee pain that started a few days ago after she stepped the wrong way. Denies other injury. Pt states she had an ACL surgery four years ago. Bearing weight and palpation worsen the pain.   History reviewed. No pertinent past medical history. Past Surgical History  Procedure Laterality Date  . Anterior cruciate ligament repair    . Tubal ligation    . Knee arthroscopy with anterior cruciate ligament (acl) repair     History reviewed. No pertinent family history. History  Substance Use Topics  . Smoking status: Former Smoker    Quit date: 10/05/2012  . Smokeless tobacco: Not on file  . Alcohol Use: Yes     Comment: occasionally   OB History   Grav Para Term Preterm Abortions TAB SAB Ect Mult Living                 Review of Systems  Musculoskeletal: Positive for arthralgias.  All other systems reviewed and are negative.    Allergies  Ceftriaxone  Home Medications   No current outpatient prescriptions on file. BP 109/72  Pulse 82  Temp(Src) 98.8 F (37.1 C) (Oral)  Resp 18  Ht 5\' 11"  (1.803 m)  Wt 156 lb (70.761 kg)  BMI 21.77 kg/m2  SpO2 99%  Physical Exam  Nursing note and vitals reviewed. Constitutional: She is oriented to person, place, and time. She appears well-developed and well-nourished. No distress.  HENT:  Head: Normocephalic and atraumatic.  Mouth/Throat: Oropharynx is clear and moist.  Eyes:  Conjunctivae and EOM are normal.  Neck: Normal range of motion. Neck supple.  Cardiovascular: Normal rate, regular rhythm and normal heart sounds.   Pulmonary/Chest: Effort normal and breath sounds normal. No respiratory distress.  Musculoskeletal: Normal range of motion. She exhibits no edema.  Firm prominence medial to patella tendon. No ligamentous laxity. No swelling. Full ROM.  Neurological: She is alert and oriented to person, place, and time. No sensory deficit.  Skin: Skin is warm and dry.  Psychiatric: She has a normal mood and affect. Her behavior is normal.    ED Course  Procedures (including critical care time)  DIAGNOSTIC STUDIES: Oxygen Saturation is 99% on RA, normal by my interpretation.    COORDINATION OF CARE: 12:49 PM-Discussed treatment plan which includes xray with pt at bedside and pt agreed to plan.   Labs Review Labs Reviewed - No data to display Imaging Review Dg Knee Complete 4 Views Left  09/05/2013   CLINICAL DATA:  New onset of medial knee pain, history of ACL repair 3 years ago  EXAM: LEFT KNEE - COMPLETE 4+ VIEW  COMPARISON:  None.  FINDINGS: The bones of the knee appear adequately mineralized. There is no evidence of an acute fracture nor dislocation. The overlying soft tissues exhibit no acute abnormalities. There is no abnormal soft tissue gas. There is evidence of the previous ACL repair.  IMPRESSION: There  is no acute or significant chronic bony abnormality of the left knee. Given the patient's symptoms and history of previous ACL repair, follow-up MRI would be useful if the patient's symptoms persist.   Electronically Signed   By: David  [place]   On: 09/05/2013 14:06    EKG Interpretation   None       MDM   1. Knee pain, left    Knee immobilizer and crutches given. NSAIDs. RICE. F/u with ortho, possible MRI for further eval with possible re-injuring ligament. Return precautions given. Patient states understanding of treatment care plan and is  agreeable.    I personally performed the services described in this documentation, which was scribed in my presence. The recorded information has been reviewed and is accurate.   Trevor Mace, PA-C 09/05/13 1416

## 2013-09-05 NOTE — ED Notes (Signed)
Discharge and follow up instructions reviewed with pt. Pt verbalized understanding.  

## 2013-09-05 NOTE — ED Notes (Signed)
Pt had acl surgery 4 years ago to L knee and now her knee is starting to hurt again. "feels like before i had to have the surgery." denies any injuries

## 2013-09-05 NOTE — ED Provider Notes (Signed)
Medical screening examination/treatment/procedure(s) were performed by non-physician practitioner and as supervising physician I was immediately available for consultation/collaboration.  Cliffard Hair L Carvell Hoeffner, MD 09/05/13 1702 

## 2013-09-05 NOTE — ED Notes (Signed)
Pt c/o left knee pain that started a few days ago, pt also states she has "knot" to her left knee that she noticed a few days ago as well. Pt has had previous surgery to same knee. Pt rates pain 7/10.

## 2013-11-10 ENCOUNTER — Encounter (HOSPITAL_COMMUNITY): Payer: Self-pay | Admitting: Emergency Medicine

## 2013-11-10 ENCOUNTER — Emergency Department (HOSPITAL_COMMUNITY): Payer: Self-pay

## 2013-11-10 DIAGNOSIS — R079 Chest pain, unspecified: Secondary | ICD-10-CM | POA: Insufficient documentation

## 2013-11-10 DIAGNOSIS — Z87891 Personal history of nicotine dependence: Secondary | ICD-10-CM | POA: Insufficient documentation

## 2013-11-10 LAB — CBC
HCT: 38.6 % (ref 36.0–46.0)
Hemoglobin: 13.5 g/dL (ref 12.0–15.0)
MCH: 29 pg (ref 26.0–34.0)
MCHC: 35 g/dL (ref 30.0–36.0)
MCV: 82.8 fL (ref 78.0–100.0)
PLATELETS: 234 10*3/uL (ref 150–400)
RBC: 4.66 MIL/uL (ref 3.87–5.11)
RDW: 14.2 % (ref 11.5–15.5)
WBC: 7.7 10*3/uL (ref 4.0–10.5)

## 2013-11-10 LAB — POCT I-STAT TROPONIN I: TROPONIN I, POC: 0 ng/mL (ref 0.00–0.08)

## 2013-11-10 NOTE — ED Notes (Signed)
C/o constant dull L sided chest pain with intermittent episodes of sharp stabbing pain since 4pm.  States she was at work pushing a cart with trash when pain started.  Denies nausea and vomiting.  Episodes of feeling hot.

## 2013-11-11 ENCOUNTER — Emergency Department (HOSPITAL_COMMUNITY)
Admission: EM | Admit: 2013-11-11 | Discharge: 2013-11-11 | Disposition: A | Discharge status: Home / Self Care | Payer: Self-pay | Attending: Emergency Medicine | Admitting: Emergency Medicine

## 2013-11-11 DIAGNOSIS — R079 Chest pain, unspecified: Secondary | ICD-10-CM

## 2013-11-11 LAB — BASIC METABOLIC PANEL
BUN: 9 mg/dL (ref 6–23)
CALCIUM: 9 mg/dL (ref 8.4–10.5)
CO2: 22 mEq/L (ref 19–32)
Chloride: 105 mEq/L (ref 96–112)
Creatinine, Ser: 0.66 mg/dL (ref 0.50–1.10)
GFR calc Af Amer: >90 mL/min (ref >90)
Glucose, Bld: 91 mg/dL (ref 70–99)
POTASSIUM: 3.7 meq/L (ref 3.7–5.3)
SODIUM: 140 meq/L (ref 137–147)

## 2013-11-11 LAB — POCT I-STAT TROPONIN I: Troponin i, poc: 0 ng/mL (ref 0.00–0.08)

## 2013-11-11 LAB — D-DIMER, QUANTITATIVE: D-Dimer, Quant: <0.27 ug/mL-FEU (ref 0.00–0.48)

## 2013-11-11 LAB — PRO B NATRIURETIC PEPTIDE: PRO B NATRI PEPTIDE: 34.3 pg/mL (ref 0–125)

## 2013-11-11 MED ORDER — ONDANSETRON HCL 4 MG/2ML IJ SOLN
4.0000 mg | Freq: Once | INTRAMUSCULAR | Status: AC
  Administered 2013-11-11: 4 mg via INTRAVENOUS
  Filled 2013-11-11: qty 2

## 2013-11-11 MED ORDER — ASPIRIN 81 MG PO CHEW: 81.0000 mg | CHEWABLE_TABLET | Freq: Every day | ORAL | Status: DC

## 2013-11-11 MED ORDER — TRAMADOL HCL 50 MG PO TABS: 50.0000 mg | ORAL_TABLET | Freq: Four times a day (QID) | ORAL | Status: DC | PRN

## 2013-11-11 MED ORDER — MORPHINE SULFATE 4 MG/ML IJ SOLN
4.0000 mg | Freq: Once | INTRAMUSCULAR | Status: AC
  Administered 2013-11-11: 4 mg via INTRAVENOUS
  Filled 2013-11-11: qty 1

## 2013-11-11 NOTE — ED Provider Notes (Signed)
CSN: [id number]     Arrival date & time 11/10/13  2257 History   First MD Initiated Contact with Patient 11/11/13 [phone]     Chief Complaint  Patient presents with  . Chest Pain   (Consider location/radiation/quality/duration/timing/severity/associated sxs/prior Treatment) HPI History provided by patient. Right-sided chest pain onset yesterday afternoon around 4 PM. Pain is sharp in quality and moderate to severe, worse with deep inspiration. Feels short of breath with severe pain otherwise no difficulty breathing. She had recent cough and cold symptoms which have resolved. No recent fevers. No chills. No nausea vomiting or diarrhea. No known sick contacts. Pain also worse with movement. No leg pain or leg swelling. No history of DVT or PE. History reviewed. No pertinent past medical history. Past Surgical History  Procedure Laterality Date  . Anterior cruciate ligament repair    . Tubal ligation    . Knee arthroscopy with anterior cruciate ligament (acl) repair     No family history on file. History  Substance Use Topics  . Smoking status: Former Smoker    Quit date: 10/05/2012  . Smokeless tobacco: Not on file  . Alcohol Use: Yes     Comment: occasionally   OB History   Grav Para Term Preterm Abortions TAB SAB Ect Mult Living                 Review of Systems  Constitutional: Negative for fever and chills.  Respiratory: Negative for wheezing.   Cardiovascular: Positive for chest pain.  Gastrointestinal: Negative for vomiting and abdominal pain.  Genitourinary: Negative for dysuria.  Musculoskeletal: Negative for back pain, neck pain and neck stiffness.  Skin: Negative for rash.  Neurological: Negative for headaches.  All other systems reviewed and are negative.    Allergies  Ceftriaxone  Home Medications  No current outpatient prescriptions on file. BP 113/68  Pulse 57  Temp(Src) 98 F (36.7 C) (Oral)  Resp 16  Ht 5\' 10"  (1.778 m)  Wt 155 lb (70.308 kg)  BMI  22.24 kg/m2  SpO2 100%  LMP 11/03/2013 Physical Exam  Constitutional: She is oriented to person, place, and time. She appears well-developed and well-nourished.  HENT:  Head: Normocephalic and atraumatic.  Eyes: EOM are normal. Pupils are equal, round, and reactive to light.  Neck: Neck supple.  Cardiovascular: Normal rate, regular rhythm and intact distal pulses.   Pulmonary/Chest: Effort normal and breath sounds normal. No respiratory distress. She exhibits no tenderness.  Abdominal: Soft. Bowel sounds are normal. She exhibits no distension. There is no tenderness.  Musculoskeletal: Normal range of motion. She exhibits no edema.  No calf tenderness. Trace pretibial symmetric edema  Neurological: She is alert and oriented to person, place, and time.  Skin: Skin is warm and dry.    ED Course  Procedures (including critical care time) Labs Review Results for orders placed during the hospital encounter of 11/11/13  CBC      Result Value Range   WBC 7.7  4.0 - 10.5 K/uL   RBC 4.66  3.87 - 5.11 MIL/uL   Hemoglobin 13.5  12.0 - 15.0 g/dL   HCT 38.6  36.0 - 46.0 %   MCV 82.8  78.0 - 100.0 fL   MCH 29.0  26.0 - 34.0 pg   MCHC 35.0  30.0 - 36.0 g/dL   RDW 14.2  11.5 - 15.5 %   Platelets 234  150 - 400 K/uL  BASIC METABOLIC PANEL      Result Value Range  Sodium 140  137 - 147 mEq/L   Potassium 3.7  3.7 - 5.3 mEq/L   Chloride 105  96 - 112 mEq/L   CO2 22  19 - 32 mEq/L   Glucose, Bld 91  70 - 99 mg/dL   BUN 9  6 - 23 mg/dL   Creatinine, Ser 0.66  0.50 - 1.10 mg/dL   Calcium 9.0  8.4 - 10.5 mg/dL   GFR calc non Af Amer >90  >90 mL/min   GFR calc Af Amer >90  >90 mL/min  PRO B NATRIURETIC PEPTIDE      Result Value Range   Pro B Natriuretic peptide (BNP) 34.3  0 - 125 pg/mL  D-DIMER, QUANTITATIVE      Result Value Range   D-Dimer, Quant <0.27  0.00 - 0.48 ug/mL-FEU  POCT I-STAT TROPONIN I      Result Value Range   Troponin i, poc 0.00  0.00 - 0.08 ng/mL   Comment 3            POCT I-STAT TROPONIN I      Result Value Range   Troponin i, poc 0.00  0.00 - 0.08 ng/mL   Comment 3            Dg Chest 2 View  11/10/2013   CLINICAL DATA:  Chest pain.  EXAM: CHEST  2 VIEW  COMPARISON:  DG CHEST 2 VIEW dated 09/28/2008  FINDINGS: Cardiopericardial silhouette within normal limits. Mediastinal contours normal. Trachea midline. No airspace disease or effusion.  IMPRESSION: No active cardiopulmonary disease.   Electronically Signed   By: Dereck Ligas M.D.   On: 11/10/2013 23:49    Imaging Review Dg Chest 2 View  11/10/2013   CLINICAL DATA:  Chest pain.  EXAM: CHEST  2 VIEW  COMPARISON:  DG CHEST 2 VIEW dated 09/28/2008  FINDINGS: Cardiopericardial silhouette within normal limits. Mediastinal contours normal. Trachea midline. No airspace disease or effusion.  IMPRESSION: No active cardiopulmonary disease.   Electronically Signed   By: Dereck Ligas M.D.   On: 11/10/2013 23:49    EKG Interpretation    Date/Time:  Sunday November 10 2013 23:04:43 EST Ventricular Rate:  59 PR Interval:  146 QRS Duration: 82 QT Interval:  408 QTC Calculation: 403 R Axis:   78 Text Interpretation:  Sinus bradycardia Nonspecific ST abnormality No significant change since last tracing Confirmed by Christo Hain  MD, Amado Andal [phone]) on 11/11/2013 12:50:27 AM           IV morphine. IV Zofran.  On recheck pain improved.  Plan discharge home with close outpatient followup. Referral provided. Strict return precautions verbalized as understood. MDM  Diagnosis: Chest pain  Low risk for ACS. Negative d-dimer. Evaluated with EKG, chest x-ray labs reviewed as above. Improved with IV narcotics. Appropriate for discharge home and outpatient followup. Vital signs and nursing notes reviewed and considered.   Teressa Lower, MD 11/11/13 [phone]

## 2013-11-11 NOTE — Discharge Instructions (Signed)
Chest Pain (Nonspecific) °It is often hard to give a specific diagnosis for the cause of chest pain. There is always a chance that your pain could be related to something serious, such as a heart attack or a blood clot in the lungs. You need to follow up with your caregiver for further evaluation. °CAUSES  °· Heartburn. °· Pneumonia or bronchitis. °· Anxiety or stress. °· Inflammation around your heart (pericarditis) or lung (pleuritis or pleurisy). °· A blood clot in the lung. °· A collapsed lung (pneumothorax). It can develop suddenly on its own (spontaneous pneumothorax) or from injury (trauma) to the chest. °· Shingles infection (herpes zoster virus). °The chest wall is composed of bones, muscles, and cartilage. Any of these can be the source of the pain. °· The bones can be bruised by injury. °· The muscles or cartilage can be strained by coughing or overwork. °· The cartilage can be affected by inflammation and become sore (costochondritis). °DIAGNOSIS  °Lab tests or other studies, such as X-rays, electrocardiography, stress testing, or cardiac imaging, may be needed to find the cause of your pain.  °TREATMENT  °· Treatment depends on what may be causing your chest pain. Treatment may include: °· Acid blockers for heartburn. °· Anti-inflammatory medicine. °· Pain medicine for inflammatory conditions. °· Antibiotics if an infection is present. °· You may be advised to change lifestyle habits. This includes stopping smoking and avoiding alcohol, caffeine, and chocolate. °· You may be advised to keep your head raised (elevated) when sleeping. This reduces the chance of acid going backward from your stomach into your esophagus. °· Most of the time, nonspecific chest pain will improve within 2 to 3 days with rest and mild pain medicine. °HOME CARE INSTRUCTIONS  °· If antibiotics were prescribed, take your antibiotics as directed. Finish them even if you start to feel better. °· For the next few days, avoid physical  activities that bring on chest pain. Continue physical activities as directed. °· Do not smoke. °· Avoid drinking alcohol. °· Only take over-the-counter or prescription medicine for pain, discomfort, or fever as directed by your caregiver. °· Follow your caregiver's suggestions for further testing if your chest pain does not go away. °· Keep any follow-up appointments you made. If you do not go to an appointment, you could develop lasting (chronic) problems with pain. If there is any problem keeping an appointment, you must call to reschedule. °SEEK MEDICAL CARE IF:  °· You think you are having problems from the medicine you are taking. Read your medicine instructions carefully. °· Your chest pain does not go away, even after treatment. °· You develop a rash with blisters on your chest. °SEEK IMMEDIATE MEDICAL CARE IF:  °· You have increased chest pain or pain that spreads to your arm, neck, jaw, back, or abdomen. °· You develop shortness of breath, an increasing cough, or you are coughing up blood. °· You have severe back or abdominal pain, feel nauseous, or vomit. °· You develop severe weakness, fainting, or chills. °· You have a fever. °THIS IS AN EMERGENCY. Do not wait to see if the pain will go away. Get medical help at once. Call your local emergency services (911 in [place] Do not drive yourself to the hospital. °MAKE SURE YOU:  °· Understand these instructions. °· Will watch your condition. °· Will get help right away if you are not doing well or get worse. °Document Released: 06/29/2005 Document Revised: 12/12/2011 Document Reviewed: 04/24/2008 °[employer]® Patient Information ©2014 [employer]   [employer]. ° °

## 2014-06-29 ENCOUNTER — Emergency Department (HOSPITAL_COMMUNITY): Payer: Self-pay

## 2014-06-29 ENCOUNTER — Encounter (HOSPITAL_COMMUNITY): Payer: Self-pay | Admitting: Emergency Medicine

## 2014-06-29 ENCOUNTER — Emergency Department (HOSPITAL_COMMUNITY)
Admission: EM | Admit: 2014-06-29 | Discharge: 2014-06-29 | Disposition: A | Discharge status: Home / Self Care | Payer: Self-pay | Attending: Emergency Medicine | Admitting: Emergency Medicine

## 2014-06-29 DIAGNOSIS — Z7982 Long term (current) use of aspirin: Secondary | ICD-10-CM | POA: Insufficient documentation

## 2014-06-29 DIAGNOSIS — R079 Chest pain, unspecified: Secondary | ICD-10-CM | POA: Insufficient documentation

## 2014-06-29 DIAGNOSIS — Z87891 Personal history of nicotine dependence: Secondary | ICD-10-CM | POA: Insufficient documentation

## 2014-06-29 DIAGNOSIS — R0789 Other chest pain: Secondary | ICD-10-CM | POA: Insufficient documentation

## 2014-06-29 LAB — BASIC METABOLIC PANEL
Anion gap: 10 (ref 5–15)
BUN: 14 mg/dL (ref 6–23)
CO2: 24 mEq/L (ref 19–32)
Calcium: 9 mg/dL (ref 8.4–10.5)
Chloride: 103 mEq/L (ref 96–112)
Creatinine, Ser: 0.81 mg/dL (ref 0.50–1.10)
GLUCOSE: 101 mg/dL — AB (ref 70–99)
POTASSIUM: 4 meq/L (ref 3.7–5.3)
SODIUM: 137 meq/L (ref 137–147)

## 2014-06-29 LAB — CBC
HEMATOCRIT: 36.9 % (ref 36.0–46.0)
HEMOGLOBIN: 12.4 g/dL (ref 12.0–15.0)
MCH: 27.9 pg (ref 26.0–34.0)
MCHC: 33.6 g/dL (ref 30.0–36.0)
MCV: 82.9 fL (ref 78.0–100.0)
Platelets: 294 10*3/uL (ref 150–400)
RBC: 4.45 MIL/uL (ref 3.87–5.11)
RDW: 14.3 % (ref 11.5–15.5)
WBC: 8.2 10*3/uL (ref 4.0–10.5)

## 2014-06-29 LAB — I-STAT TROPONIN, ED: Troponin i, poc: 0 ng/mL (ref 0.00–0.08)

## 2014-06-29 LAB — D-DIMER, QUANTITATIVE (NOT AT ARMC): D-Dimer, Quant: <0.27 ug/mL-FEU (ref 0.00–0.48)

## 2014-06-29 LAB — TROPONIN I: Troponin I: <0.3 ng/mL (ref <0.30)

## 2014-06-29 MED ORDER — IBUPROFEN 800 MG PO TABS: 800.0000 mg | ORAL_TABLET | Freq: Three times a day (TID) | ORAL | Status: DC

## 2014-06-29 MED ORDER — KETOROLAC TROMETHAMINE 30 MG/ML IJ SOLN
30.0000 mg | Freq: Once | INTRAMUSCULAR | Status: AC
  Administered 2014-06-29: 30 mg via INTRAVENOUS
  Filled 2014-06-29: qty 1

## 2014-06-29 NOTE — Discharge Instructions (Signed)
Chest Pain (Nonspecific) °There is no evidence of heart attack or blood clot in the lung. Follow up with your doctor. Return to the ED if you develop new or worsening symptoms. °It is often hard to give a specific diagnosis for the cause of chest pain. There is always a chance that your pain could be related to something serious, such as a heart attack or a blood clot in the lungs. You need to follow up with your health care provider for further evaluation. °CAUSES  °· Heartburn. °· Pneumonia or bronchitis. °· Anxiety or stress. °· Inflammation around your heart (pericarditis) or lung (pleuritis or pleurisy). °· A blood clot in the lung. °· A collapsed lung (pneumothorax). It can develop suddenly on its own (spontaneous pneumothorax) or from trauma to the chest. °· Shingles infection (herpes zoster virus). °The chest wall is composed of bones, muscles, and cartilage. Any of these can be the source of the pain. °· The bones can be bruised by injury. °· The muscles or cartilage can be strained by coughing or overwork. °· The cartilage can be affected by inflammation and become sore (costochondritis). °DIAGNOSIS  °Lab tests or other studies may be needed to find the cause of your pain. Your health care provider may have you take a test called an ambulatory electrocardiogram (ECG). An ECG records your heartbeat patterns over a 24-hour period. You may also have other tests, such as: °· Transthoracic echocardiogram (TTE). During echocardiography, sound waves are used to evaluate how blood flows through your heart. °· Transesophageal echocardiogram (TEE). °· Cardiac monitoring. This allows your health care provider to monitor your heart rate and rhythm in real time. °· Holter monitor. This is a portable device that records your heartbeat and can help diagnose heart arrhythmias. It allows your health care provider to track your heart activity for several days, if needed. °· Stress tests by exercise or by giving medicine  that makes the heart beat faster. °TREATMENT  °· Treatment depends on what may be causing your chest pain. Treatment may include: °¨ Acid blockers for heartburn. °¨ Anti-inflammatory medicine. °¨ Pain medicine for inflammatory conditions. °¨ Antibiotics if an infection is present. °· You may be advised to change lifestyle habits. This includes stopping smoking and avoiding alcohol, caffeine, and chocolate. °· You may be advised to keep your head raised (elevated) when sleeping. This reduces the chance of acid going backward from your stomach into your esophagus. °Most of the time, nonspecific chest pain will improve within 2-3 days with rest and mild pain medicine.  °HOME CARE INSTRUCTIONS  °· If antibiotics were prescribed, take them as directed. Finish them even if you start to feel better. °· For the next few days, avoid physical activities that bring on chest pain. Continue physical activities as directed. °· Do not use any tobacco products, including cigarettes, chewing tobacco, or electronic cigarettes. °· Avoid drinking alcohol. °· Only take medicine as directed by your health care provider. °· Follow your health care provider's suggestions for further testing if your chest pain does not go away. °· Keep any follow-up appointments you made. If you do not go to an appointment, you could develop lasting (chronic) problems with pain. If there is any problem keeping an appointment, call to reschedule. °SEEK MEDICAL CARE IF:  °· Your chest pain does not go away, even after treatment. °· You have a rash with blisters on your chest. °· You have a fever. °SEEK IMMEDIATE MEDICAL CARE IF:  °· You have increased   chest pain or pain that spreads to your arm, neck, jaw, back, or abdomen. °· You have shortness of breath. °· You have an increasing cough, or you cough up blood. °· You have severe back or abdominal pain. °· You feel nauseous or vomit. °· You have severe weakness. °· You faint. °· You have chills. °This is an  emergency. Do not wait to see if the pain will go away. Get medical help at once. Call your local emergency services (911 in [place] Do not drive yourself to the hospital. °MAKE SURE YOU:  °· Understand these instructions. °· Will watch your condition. °· Will get help right away if you are not doing well or get worse. °Document Released: 06/29/2005 Document Revised: 09/24/2013 Document Reviewed: 04/24/2008 °[employer]® Patient Information ©2015 [employer]. This information is not intended to replace advice given to you by your health care provider. Make sure you discuss any questions you have with your health care provider. ° °

## 2014-06-29 NOTE — ED Notes (Signed)
EKG given to [place] for review

## 2014-06-29 NOTE — ED Provider Notes (Signed)
CSN: [id number]     Arrival date & time 06/29/14  1922 History   First MD Initiated Contact with Patient 06/29/14 1941     Chief Complaint  Patient presents with  . Chest Pain     (Consider location/radiation/quality/duration/timing/severity/associated sxs/prior Treatment) HPI Comments: Patient states constant left-sided chest pain onset 2 hours ago. It came on while she was working at [employer]. It is worse with movement and palpitation. It does not radiate.  It is worse with deep breathing and leaning forward. She denies any shortness of breath, nausea or diaphoresis. Denies any back or neck pain. She has no abdominal pain. No cardiac history. Has had pain previously without diagnosis. She's never had a stress test. She took aspirin with partial relief.  The history is provided by the patient.    History reviewed. No pertinent past medical history. Past Surgical History  Procedure Laterality Date  . Anterior cruciate ligament repair    . Tubal ligation    . Knee arthroscopy with anterior cruciate ligament (acl) repair     No family history on file. History  Substance Use Topics  . Smoking status: Former Smoker    Quit date: 10/05/2012  . Smokeless tobacco: Not on file  . Alcohol Use: Yes     Comment: occasionally- weekends   OB History   Grav Para Term Preterm Abortions TAB SAB Ect Mult Living                 Review of Systems  Constitutional: Negative for fever, activity change and appetite change.  Respiratory: Positive for chest tightness. Negative for cough and shortness of breath.   Cardiovascular: Positive for chest pain.  Gastrointestinal: Negative for nausea, vomiting and abdominal pain.  Genitourinary: Negative for dysuria, hematuria, vaginal bleeding and vaginal discharge.  Musculoskeletal: Negative for arthralgias, back pain, myalgias, neck pain and neck stiffness.  Skin: Negative for wound.  Neurological: Negative for dizziness, weakness and headaches.  A complete  10 system review of systems was obtained and all systems are negative except as noted in the HPI and PMH.      Allergies  Ceftriaxone and Tramadol  Home Medications   Prior to Admission medications   Medication Sig Start Date End Date Taking? Authorizing Provider  aspirin 81 MG chewable tablet Chew 81 mg by mouth as needed (chest pain).   Yes Historical Provider, MD  ibuprofen (ADVIL,MOTRIN) 800 MG tablet Take 1 tablet (800 mg total) by mouth 3 (three) times daily. 06/29/14   Ezequiel Essex, MD   BP 118/77  Pulse 63  Temp(Src) 97.7 F (36.5 C) (Oral)  Resp 12  Ht 5\' 10"  (1.778 m)  Wt 155 lb (70.308 kg)  BMI 22.24 kg/m2  SpO2 99%  LMP 06/27/2014 Physical Exam  Nursing note and vitals reviewed. Constitutional: She is oriented to person, place, and time. She appears well-developed and well-nourished. No distress.  HENT:  Head: Normocephalic and atraumatic.  Mouth/Throat: Oropharynx is clear and moist. No oropharyngeal exudate.  Eyes: Conjunctivae and EOM are normal. Pupils are equal, round, and reactive to light.  Neck: Normal range of motion. Neck supple.  No meningismus.  Cardiovascular: Normal rate, regular rhythm, normal heart sounds and intact distal pulses.   No murmur heard. Pulmonary/Chest: Effort normal and breath sounds normal. No respiratory distress. She exhibits tenderness.  TTP L chest wall, worse with palpation and arm movement  Abdominal: Soft. There is no tenderness. There is no rebound and no guarding.  Musculoskeletal: Normal range of  motion. She exhibits no edema and no tenderness.  Neurological: She is alert and oriented to person, place, and time. No cranial nerve deficit. She exhibits normal muscle tone. Coordination normal.  No ataxia on finger to nose bilaterally. No pronator drift. 5/5 strength throughout. CN 2-12 intact. Negative Romberg. Equal grip strength. Sensation intact. Gait is normal.   Skin: Skin is warm.  Psychiatric: She has a normal mood  and affect. Her behavior is normal.    ED Course  Procedures (including critical care time) Labs Review Labs Reviewed  BASIC METABOLIC PANEL - Abnormal; Notable for the following:    Glucose, Bld 101 (*)    All other components within normal limits  CBC  D-DIMER, QUANTITATIVE  TROPONIN I  Randolm Idol, ED    Imaging Review Dg Chest Port 1 View  06/29/2014   CLINICAL DATA:  Chest pain, shortness of breath  EXAM: PORTABLE CHEST - 1 VIEW  COMPARISON:  11/10/2013  FINDINGS: Lungs are clear.  No pleural effusion or pneumothorax.  The heart is normal in size.  IMPRESSION: No evidence of acute cardiopulmonary disease.   Electronically Signed   By: Julian Hy M.D.   On: 06/29/2014 20:00     EKG Interpretation   Date/Time:  Sunday June 29 2014 19:29:06 EDT Ventricular Rate:  62 PR Interval:  144 QRS Duration: 80 QT Interval:  390 QTC Calculation: 396 R Axis:   68 Text Interpretation:  Sinus rhythm Baseline wander in lead(s) V5 No  significant change since last tracing Confirmed by JACUBOWITZ  MD, SAM  [phone]) on 06/29/2014 7:35:15 PM      MDM   Final diagnoses:  Atypical chest pain  L sided chest pain, constant, worse with movement and palpation.  EKG nsr.  Atypical for ACS.  CXR negative.  Troponin negative. D-dimer negative. Pain is improved with anti-inflammatories in ED.   Atypical for ACS. Doubt PE with a negative d-dimer. We'll treat with anti-inflammatories and followup with PCP. Return precautions discussed.   Ezequiel Essex, MD 06/29/14 [phone]

## 2014-06-29 NOTE — ED Notes (Addendum)
Patient states she is having left sided chest pain. patient states she was at work at [employer] when the pain started. Patient reports last meal at 1630, denies greasy or spicy foods. Patient states the pain is sharp and radiates into her left arm. Patient states she has had this pain before and was seen at [facility], unable to state diagnosis given, reports she was given Tramadol for the pain, states this helped with her pain but made her drowsy. Patient states she took 325mg  ASA when her pain started at 1900.

## 2014-08-30 ENCOUNTER — Emergency Department (HOSPITAL_COMMUNITY)
Admission: EM | Admit: 2014-08-30 | Discharge: 2014-08-31 | Disposition: A | Discharge status: Home / Self Care | Payer: Self-pay | Attending: Emergency Medicine | Admitting: Emergency Medicine

## 2014-08-30 ENCOUNTER — Encounter (HOSPITAL_COMMUNITY): Payer: Self-pay | Admitting: Emergency Medicine

## 2014-08-30 DIAGNOSIS — Z87891 Personal history of nicotine dependence: Secondary | ICD-10-CM | POA: Insufficient documentation

## 2014-08-30 DIAGNOSIS — Z3202 Encounter for pregnancy test, result negative: Secondary | ICD-10-CM | POA: Insufficient documentation

## 2014-08-30 DIAGNOSIS — K529 Noninfective gastroenteritis and colitis, unspecified: Secondary | ICD-10-CM | POA: Insufficient documentation

## 2014-08-30 DIAGNOSIS — Z7982 Long term (current) use of aspirin: Secondary | ICD-10-CM | POA: Insufficient documentation

## 2014-08-30 DIAGNOSIS — Z791 Long term (current) use of non-steroidal anti-inflammatories (NSAID): Secondary | ICD-10-CM | POA: Insufficient documentation

## 2014-08-30 MED ORDER — FAMOTIDINE 20 MG PO TABS
20.0000 mg | ORAL_TABLET | Freq: Once | ORAL | Status: AC
  Administered 2014-08-30: 20 mg via ORAL
  Filled 2014-08-30: qty 1

## 2014-08-30 MED ORDER — ACETAMINOPHEN 325 MG PO TABS
650.0000 mg | ORAL_TABLET | Freq: Once | ORAL | Status: AC
  Administered 2014-08-30: 650 mg via ORAL
  Filled 2014-08-30: qty 2

## 2014-08-30 MED ORDER — ONDANSETRON 4 MG PO TBDP
4.0000 mg | ORAL_TABLET | Freq: Once | ORAL | Status: AC
  Administered 2014-08-30: 4 mg via ORAL
  Filled 2014-08-30: qty 1

## 2014-08-30 NOTE — ED Notes (Signed)
Pt reported emesis x4 in past 24 hrs and diarrhea x2 in past 24 hrs.

## 2014-08-30 NOTE — ED Notes (Addendum)
Pt drink 113ml of water without difficulty. No reported of emesis/nausea noted.

## 2014-08-30 NOTE — ED Provider Notes (Signed)
CSN: [id number]     Arrival date & time 08/30/14  2129 History   First MD Initiated Contact with Patient 08/30/14 2152     Chief Complaint  Patient presents with  . Emesis     (Consider location/radiation/quality/duration/timing/severity/associated sxs/prior Treatment) HPI Comments: Patient with past medical history of ruptured ectopic pregnancy presents with complaint of acute onset of nausea, vomiting, and diarrhea approximately one hour prior to arrival. Patient was out eating at a restaurant when the symptoms began. After the vomiting began she developed a crampy upper abdominal pain and chills. No reported fever. No shortness of breath or chest pain. Diarrhea is nonbloody. No urinary symptoms including dysuria, hematuria, or increased frequency. No vaginal bleeding or discharge. Last menstrual period was 2 weeks ago. Patient states that this came on early. No treatments prior to arrival.  The history is provided by the patient and medical records.    History reviewed. No pertinent past medical history. Past Surgical History  Procedure Laterality Date  . Anterior cruciate ligament repair    . Tubal ligation    . Knee arthroscopy with anterior cruciate ligament (acl) repair     History reviewed. No pertinent family history. History  Substance Use Topics  . Smoking status: Former Smoker    Quit date: 10/05/2012  . Smokeless tobacco: Not on file  . Alcohol Use: Yes     Comment: occasionally- weekends   OB History    No data available     Review of Systems  Constitutional: Negative for fever.  HENT: Negative for rhinorrhea and sore throat.   Eyes: Negative for redness.  Respiratory: Negative for cough.   Cardiovascular: Negative for chest pain.  Gastrointestinal: Positive for nausea, vomiting, abdominal pain and diarrhea. Negative for constipation and blood in stool.  Genitourinary: Negative for dysuria.  Musculoskeletal: Negative for myalgias.  Skin: Negative for rash.   Neurological: Negative for headaches.      Allergies  Ceftriaxone and Tramadol  Home Medications   Prior to Admission medications   Medication Sig Start Date End Date Taking? Authorizing Provider  aspirin 81 MG chewable tablet Chew 81 mg by mouth as needed (chest pain).    Historical Provider, MD  ibuprofen (ADVIL,MOTRIN) 800 MG tablet Take 1 tablet (800 mg total) by mouth 3 (three) times daily. 06/29/14   Ezequiel Essex, MD   BP 104/60 mmHg  Pulse 70  Temp(Src) 97.6 F (36.4 C) (Oral)  Resp 18  Ht 5\' 10"  (1.778 m)  Wt 155 lb (70.308 kg)  BMI 22.24 kg/m2  SpO2 100%  LMP 08/17/2014 Physical Exam  Constitutional: She appears well-developed and well-nourished.  Patient is having shaking chills.  HENT:  Head: Normocephalic and atraumatic.  Eyes: Conjunctivae are normal. Right eye exhibits no discharge. Left eye exhibits no discharge.  Neck: Normal range of motion. Neck supple.  Cardiovascular: Normal rate, regular rhythm and normal heart sounds.   Pulmonary/Chest: Effort normal and breath sounds normal.  Abdominal: Soft. Bowel sounds are normal. She exhibits no distension. There is tenderness in the epigastric area. There is no rebound and no guarding.  Neurological: She is alert.  Skin: Skin is warm and dry.  Psychiatric: She has a normal mood and affect.  Nursing note and vitals reviewed.   ED Course  Procedures (including critical care time) Labs Review Labs Reviewed  URINALYSIS, ROUTINE W REFLEX MICROSCOPIC - Abnormal; Notable for the following:    APPearance CLOUDY (*)    Ketones, ur 15 (*)  Leukocytes, UA MODERATE (*)    All other components within normal limits  URINE MICROSCOPIC-ADD ON - Abnormal; Notable for the following:    Squamous Epithelial / LPF MANY (*)    Bacteria, UA MANY (*)    All other components within normal limits  PREGNANCY, URINE  POC URINE PREG, ED    Imaging Review No results found.   EKG Interpretation None      10:26 PM  Patient seen and examined. Work-up initiated. Medications ordered.   Vital signs reviewed and are as follows: BP 104/60 mmHg  Pulse 70  Temp(Src) 97.6 F (36.4 C) (Oral)  Resp 18  Ht 5\' 10"  (1.778 m)  Wt 155 lb (70.308 kg)  BMI 22.24 kg/m2  SpO2 100%  LMP 08/17/2014  1:09 AM patient improved with nausea medication, Pepcid. She is taking apple juice in room without vomiting. Her abdominal pain is improved.  Counseled patient on clear liquids and brat diet.  The patient was urged to return to the Emergency Department immediately with worsening of current symptoms, worsening abdominal pain, persistent vomiting, blood noted in stools, fever, or any other concerns. The patient verbalized understanding.   UPT neg. UA is contaminated. No clinical UTI.    MDM   Final diagnoses:  Gastroenteritis   Patient with symptoms consistent with viral gastroenteritis. Vitals are stable, no fever.  No signs of dehydration, tolerating PO's. Lungs are clear. No focal abdominal pain, no concern for appendicitis, cholecystitis, pancreatitis, ruptured viscus, UTI, kidney stone, or any other abdominal etiology. Supportive therapy indicated with return if symptoms worsen. Patient counseled.     Carlisle Cater, PA-C 08/31/14 0110  Shaune Pollack, MD 08/31/14 [phone]

## 2014-08-30 NOTE — ED Notes (Signed)
Awake. Verbally responsive. Resp even and unlabored. ABC's intact. Pt reported having nausea but no active vomiting at this time. NAD noted.

## 2014-08-30 NOTE — ED Notes (Signed)
Pt arrived via EMS on stretcher. Sitting bench at restaurant outside actively vomiting. Pt ate ham sandwich at home prior to going to restaurant and upon arrival pt started vomiting moderate amt. Diarrhea noted. No abd pain and cramping with emesis.

## 2014-08-30 NOTE — ED Notes (Signed)
Bed: [facility] Expected date:  Expected time:  Means of arrival:  Comments: EMS-N/V 

## 2014-08-30 NOTE — ED Notes (Signed)
Resting quietly with eye closed. Easily arousable. Verbally responsive. Resp even and unlabored. ABC's intact. NAD noted.  

## 2014-08-31 LAB — URINALYSIS, ROUTINE W REFLEX MICROSCOPIC
Bilirubin Urine: NEGATIVE
GLUCOSE, UA: NEGATIVE mg/dL
HGB URINE DIPSTICK: NEGATIVE
KETONES UR: 15 mg/dL — AB
Nitrite: NEGATIVE
PROTEIN: NEGATIVE mg/dL
Specific Gravity, Urine: 1.021 (ref 1.005–1.030)
Urobilinogen, UA: 0.2 mg/dL (ref 0.0–1.0)
pH: 5 (ref 5.0–8.0)

## 2014-08-31 LAB — URINE MICROSCOPIC-ADD ON

## 2014-08-31 LAB — PREGNANCY, URINE: PREG TEST UR: NEGATIVE

## 2014-08-31 MED ORDER — FAMOTIDINE 20 MG PO TABS: 20.0000 mg | ORAL_TABLET | Freq: Two times a day (BID) | ORAL | Status: DC

## 2014-08-31 MED ORDER — ONDANSETRON 4 MG PO TBDP: 4.0000 mg | ORAL_TABLET | Freq: Three times a day (TID) | ORAL | Status: DC | PRN

## 2014-08-31 NOTE — ED Notes (Signed)
Resting quietly with eye closed. Easily arousable. Verbally responsive. Resp even and unlabored. ABC's intact. Family at bedside. No reported of N/V/D since admission.

## 2014-08-31 NOTE — Discharge Instructions (Signed)
Please read and follow all provided instructions.  Your diagnoses today include:  1. Gastroenteritis     Tests performed today include:  Urine test to look for infection and pregnancy (in women)  Vital signs. See below for your results today.   Medications prescribed:   Zofran (ondansetron) - for nausea and vomiting   Pepcid (famotidine) - antihistamine  You can find this medication over-the-counter.   DO NOT exceed:   20mg  Pepcid every 12 hours  Take any prescribed medications only as directed.  Home care instructions:   Follow any educational materials contained in this packet.   Your abdominal pain, nausea, vomiting, and diarrhea may be caused by a viral gastroenteritis also called 'stomach flu'. You should rest for the next several days. Keep drinking plenty of fluids and use the medicine for nausea as directed.    Drink clear liquids for the next 24 hours and introduce solid foods slowly after 24 hours using the b.r.a.t. diet (Bananas, Rice, Applesauce, Toast, Yogurt).    Follow-up instructions: Please follow-up with your primary care provider in the next 2 days for further evaluation of your symptoms. If you are not feeling better in 48 hours you may have a condition that is more serious and you need re-evaluation.   Return instructions:  SEEK IMMEDIATE MEDICAL ATTENTION IF:  If you have pain that does not go away or becomes severe   A temperature above 101F develops   Repeated vomiting occurs (multiple episodes)   If you have pain that becomes localized to portions of the abdomen. The right side could possibly be appendicitis. In an adult, the left lower portion of the abdomen could be colitis or diverticulitis.   Blood is being passed in stools or vomit (bright red or black tarry stools)   You develop chest pain, difficulty breathing, dizziness or fainting, or become confused, poorly responsive, or inconsolable (young children)  If you have any other  emergent concerns regarding your health  Additional Information: Abdominal (belly) pain can be caused by many things. Your caregiver performed an examination and possibly ordered blood/urine tests and imaging (CT scan, x-rays, ultrasound). Many cases can be observed and treated at home after initial evaluation in the emergency department. Even though you are being discharged home, abdominal pain can be unpredictable. Therefore, you need a repeated exam if your pain does not resolve, returns, or worsens. Most patients with abdominal pain don't have to be admitted to the hospital or have surgery, but serious problems like appendicitis and gallbladder attacks can start out as nonspecific pain. Many abdominal conditions cannot be diagnosed in one visit, so follow-up evaluations are very important.  Your vital signs today were: BP 110/47 mmHg   Pulse 78   Temp(Src) 98.3 F (36.8 C) (Oral)   Resp 18   Ht 5\' 10"  (1.778 m)   Wt 155 lb (70.308 kg)   BMI 22.24 kg/m2   SpO2 100%   LMP 08/17/2014 If your blood pressure (bp) was elevated above 135/85 this visit, please have this repeated by your doctor within one month. --------------

## 2014-08-31 NOTE — ED Notes (Signed)
Awake. Verbally responsive. Resp even and unlabored. ABC's intact. NAD noted.

## 2015-05-01 ENCOUNTER — Encounter (HOSPITAL_COMMUNITY): Payer: Self-pay

## 2015-05-01 ENCOUNTER — Emergency Department (HOSPITAL_COMMUNITY)
Admission: EM | Admit: 2015-05-01 | Discharge: 2015-05-01 | Disposition: A | Discharge status: Home / Self Care | Payer: Self-pay | Attending: Emergency Medicine | Admitting: Emergency Medicine

## 2015-05-01 DIAGNOSIS — Z87891 Personal history of nicotine dependence: Secondary | ICD-10-CM | POA: Insufficient documentation

## 2015-05-01 DIAGNOSIS — Z3202 Encounter for pregnancy test, result negative: Secondary | ICD-10-CM | POA: Insufficient documentation

## 2015-05-01 DIAGNOSIS — Z9851 Tubal ligation status: Secondary | ICD-10-CM | POA: Insufficient documentation

## 2015-05-01 DIAGNOSIS — R102 Pelvic and perineal pain: Secondary | ICD-10-CM | POA: Insufficient documentation

## 2015-05-01 LAB — URINALYSIS, ROUTINE W REFLEX MICROSCOPIC
BILIRUBIN URINE: NEGATIVE
GLUCOSE, UA: NEGATIVE mg/dL
HGB URINE DIPSTICK: NEGATIVE
KETONES UR: NEGATIVE mg/dL
NITRITE: NEGATIVE
PH: 5.5 (ref 5.0–8.0)
Protein, ur: NEGATIVE mg/dL
SPECIFIC GRAVITY, URINE: 1.023 (ref 1.005–1.030)
Urobilinogen, UA: 1 mg/dL (ref 0.0–1.0)

## 2015-05-01 LAB — URINE MICROSCOPIC-ADD ON

## 2015-05-01 LAB — WET PREP, GENITAL
CLUE CELLS WET PREP: NONE SEEN
Trich, Wet Prep: NONE SEEN
Yeast Wet Prep HPF POC: NONE SEEN

## 2015-05-01 LAB — POC URINE PREG, ED: Preg Test, Ur: NEGATIVE

## 2015-05-01 MED ORDER — IBUPROFEN 800 MG PO TABS
800.0000 mg | ORAL_TABLET | Freq: Once | ORAL | Status: AC
  Administered 2015-05-01: 800 mg via ORAL
  Filled 2015-05-01: qty 1

## 2015-05-01 NOTE — ED Notes (Signed)
Pt declined vitals stating she had to leave

## 2015-05-01 NOTE — ED Notes (Signed)
PELVIC CART READY

## 2015-05-01 NOTE — ED Notes (Signed)
Pt states clitoral pain x 1 week ago.  Some pelvic pain also.  Patient thought it was a new soap she had used.  Pt denies rough intercourse or trauma to area.  Denies lesions or sore. Some vaginal discharge with odor.  Abdominal pain started x 3 days ago.  Pt denies sexual interaction in last month.

## 2015-05-01 NOTE — Discharge Instructions (Signed)

## 2015-05-01 NOTE — ED Notes (Signed)
MD at bedside. [place]

## 2015-05-01 NOTE — ED Provider Notes (Signed)
CSN: [id number]     Arrival date & time 05/01/15  1439 History   First MD Initiated Contact with Patient 05/01/15 1516     Chief Complaint  Patient presents with  . Vaginal Pain  . Abdominal Pain     Patient is a 39 y.o. female presenting with vaginal pain and abdominal pain. The history is provided by the patient.  Vaginal Pain This is a new problem. The current episode started more than 2 days ago. The problem occurs daily. Associated symptoms include abdominal pain. Exacerbated by: urination. The symptoms are relieved by rest.  Abdominal Pain Associated symptoms: vaginal discharge   Associated symptoms: no fever   pt reports vaginal and "Clitoral" pain for one week after urination She also reports LLQ abd pain No fever/vomiting No vag bleeding but she has had some vaginal discharge Denies recent sexual intercourse Denies recent exposure to STD  History reviewed. No pertinent past medical history. Past Surgical History  Procedure Laterality Date  . Anterior cruciate ligament repair    . Tubal ligation    . Knee arthroscopy with anterior cruciate ligament (acl) repair     History reviewed. No pertinent family history. History  Substance Use Topics  . Smoking status: Former Smoker    Quit date: 10/05/2012  . Smokeless tobacco: Not on file  . Alcohol Use: Yes     Comment: occasionally- weekends   OB History    No data available     Review of Systems  Constitutional: Negative for fever.  Gastrointestinal: Positive for abdominal pain.  Genitourinary: Positive for vaginal discharge and vaginal pain.  All other systems reviewed and are negative.     Allergies  Ceftriaxone and Tramadol  Home Medications   Prior to Admission medications   Not on File   BP 114/62 mmHg  Pulse 88  Temp(Src) 98.2 F (36.8 C) (Oral)  Resp 16  SpO2 99%  LMP 04/06/2015 (Approximate) Physical Exam CONSTITUTIONAL: Well developed/well nourished HEAD: Normocephalic/atraumatic EYES:  EOMI ENMT: Mucous membranes moist NECK: supple no meningeal signs SPINE/BACK:entire spine nontender CV: S1/S2 noted, no murmurs/rubs/gallops noted LUNGS: Lungs are clear to auscultation bilaterally, no apparent distress ABDOMEN: soft, nontender, no rebound or guarding, bowel sounds noted throughout abdomen GU:no cva tenderness, no signs of herpetic lesions, no abscess, no signs of trauma, no erythema, minimal vaginal discharge and no vag bleeding.  Nurse Caryl Pina present for exam NEURO: Pt is awake/alert/appropriate, moves all extremitiesx4.  No facial droop.   EXTREMITIES: pulses normal/equal, full ROM SKIN: warm, color normal PSYCH: no abnormalities of mood noted, alert and oriented to situation  ED Course  Procedures   Pt well appearing No signs of abscess/herpes No focal tenderness noted  Labs Review Labs Reviewed  WET PREP, GENITAL - Abnormal; Notable for the following:    WBC, Wet Prep HPF POC FEW (*)    All other components within normal limits  URINALYSIS, ROUTINE W REFLEX MICROSCOPIC (NOT AT [facility]) - Abnormal; Notable for the following:    APPearance CLOUDY (*)    Leukocytes, UA MODERATE (*)    All other components within normal limits  URINE CULTURE  URINE MICROSCOPIC-ADD ON  HIV ANTIBODY (ROUTINE TESTING)  POC URINE PREG, ED  GC/CHLAMYDIA PROBE AMP ([place]) NOT AT [facility]     MDM   Final diagnoses:  Vaginal pain    Nursing notes including past medical history and social history reviewed and considered in documentation Labs/vital reviewed myself and considered during evaluation  Ripley Fraise, MD 05/01/15 [phone]

## 2015-05-02 LAB — HIV ANTIBODY (ROUTINE TESTING W REFLEX): HIV Screen 4th Generation wRfx: NONREACTIVE

## 2015-05-03 LAB — URINE CULTURE

## 2015-05-04 LAB — GC/CHLAMYDIA PROBE AMP (~~LOC~~) NOT AT ARMC
Chlamydia: NEGATIVE
NEISSERIA GONORRHEA: NEGATIVE

## 2015-06-08 ENCOUNTER — Emergency Department (HOSPITAL_COMMUNITY)
Admission: EM | Admit: 2015-06-08 | Discharge: 2015-06-08 | Disposition: A | Discharge status: Home / Self Care | Payer: Self-pay | Attending: Emergency Medicine | Admitting: Emergency Medicine

## 2015-06-08 ENCOUNTER — Emergency Department (HOSPITAL_COMMUNITY): Payer: Self-pay

## 2015-06-08 ENCOUNTER — Encounter (HOSPITAL_COMMUNITY): Payer: Self-pay | Admitting: Emergency Medicine

## 2015-06-08 DIAGNOSIS — Y9289 Other specified places as the place of occurrence of the external cause: Secondary | ICD-10-CM | POA: Insufficient documentation

## 2015-06-08 DIAGNOSIS — S93402A Sprain of unspecified ligament of left ankle, initial encounter: Secondary | ICD-10-CM | POA: Insufficient documentation

## 2015-06-08 DIAGNOSIS — Z87891 Personal history of nicotine dependence: Secondary | ICD-10-CM | POA: Insufficient documentation

## 2015-06-08 DIAGNOSIS — Y998 Other external cause status: Secondary | ICD-10-CM | POA: Insufficient documentation

## 2015-06-08 DIAGNOSIS — Y9389 Activity, other specified: Secondary | ICD-10-CM | POA: Insufficient documentation

## 2015-06-08 DIAGNOSIS — W1839XA Other fall on same level, initial encounter: Secondary | ICD-10-CM | POA: Insufficient documentation

## 2015-06-08 MED ORDER — IBUPROFEN 800 MG PO TABS: 800.0000 mg | ORAL_TABLET | Freq: Three times a day (TID) | ORAL | Status: DC

## 2015-06-08 NOTE — ED Notes (Signed)
Pt fell and injured left ankle. Obvious swelling and slight deformity. No other c/c.

## 2015-06-08 NOTE — Discharge Instructions (Signed)
Ankle Sprain °An ankle sprain is an injury to the strong, fibrous tissues (ligaments) that hold the bones of your ankle joint together.  °CAUSES °An ankle sprain is usually caused by a fall or by twisting your ankle. Ankle sprains most commonly occur when you step on the outer edge of your foot, and your ankle turns inward. People who participate in sports are more prone to these types of injuries.  °SYMPTOMS  °· Pain in your ankle. The pain may be present at rest or only when you are trying to stand or walk. °· Swelling. °· Bruising. Bruising may develop immediately or within 1 to 2 days after your injury. °· Difficulty standing or walking, particularly when turning corners or changing directions. °DIAGNOSIS  °Your caregiver will ask you details about your injury and perform a physical exam of your ankle to determine if you have an ankle sprain. During the physical exam, your caregiver will press on and apply pressure to specific areas of your foot and ankle. Your caregiver will try to move your ankle in certain ways. An X-ray exam may be done to be sure a bone was not broken or a ligament did not separate from one of the bones in your ankle (avulsion fracture).  °TREATMENT  °Certain types of braces can help stabilize your ankle. Your caregiver can make a recommendation for this. Your caregiver may recommend the use of medicine for pain. If your sprain is severe, your caregiver may refer you to a surgeon who helps to restore function to parts of your skeletal system (orthopedist) or a physical therapist. °HOME CARE INSTRUCTIONS  °· Apply ice to your injury for 1-2 days or as directed by your caregiver. Applying ice helps to reduce inflammation and pain. °· Put ice in a plastic bag. °· Place a towel between your skin and the bag. °· Leave the ice on for 15-20 minutes at a time, every 2 hours while you are awake. °· Only take over-the-counter or prescription medicines for pain, discomfort, or fever as directed by  your caregiver. °· Elevate your injured ankle above the level of your heart as much as possible for 2-3 days. °· If your caregiver recommends crutches, use them as instructed. Gradually put weight on the affected ankle. Continue to use crutches or a cane until you can walk without feeling pain in your ankle. °· If you have a plaster splint, wear the splint as directed by your caregiver. Do not rest it on anything harder than a pillow for the first 24 hours. Do not put weight on it. Do not get it wet. You may take it off to take a shower or bath. °· You may have been given an elastic bandage to wear around your ankle to provide support. If the elastic bandage is too tight (you have numbness or tingling in your foot or your foot becomes cold and blue), adjust the bandage to make it comfortable. °· If you have an air splint, you may blow more air into it or let air out to make it more comfortable. You may take your splint off at night and before taking a shower or bath. Wiggle your toes in the splint several times per day to decrease swelling. °SEEK MEDICAL CARE IF:  °· You have rapidly increasing bruising or swelling. °· Your toes feel extremely cold or you lose feeling in your foot. °· Your pain is not relieved with medicine. °SEEK IMMEDIATE MEDICAL CARE IF: °· Your toes are numb or blue. °·   You have severe pain that is increasing. °MAKE SURE YOU:  °· Understand these instructions. °· Will watch your condition. °· Will get help right away if you are not doing well or get worse. °Document Released: 09/19/2005 Document Revised: 06/13/2012 Document Reviewed: 10/01/2011 °ExitCare® Patient Information ©2015 [employer]. This information is not intended to replace advice given to you by your health care provider. Make sure you discuss any questions you have with your health care provider. °Cryotherapy °Cryotherapy means treatment with cold. Ice or gel packs can be used to reduce both pain and swelling. Ice is the most  helpful within the first 24 to 48 hours after an injury or flare-up from overusing a muscle or joint. Sprains, strains, spasms, burning pain, shooting pain, and aches can all be eased with ice. Ice can also be used when recovering from surgery. Ice is effective, has very few side effects, and is safe for most people to use. °PRECAUTIONS  °Ice is not a safe treatment option for people with: °· Raynaud phenomenon. This is a condition affecting small blood vessels in the extremities. Exposure to cold may cause your problems to return. °· Cold hypersensitivity. There are many forms of cold hypersensitivity, including: °¨ Cold urticaria. Red, itchy hives appear on the skin when the tissues begin to warm after being iced. °¨ Cold erythema. This is a red, itchy rash caused by exposure to cold. °¨ Cold hemoglobinuria. Red blood cells break down when the tissues begin to warm after being iced. The hemoglobin that carry oxygen are passed into the urine because they cannot combine with blood proteins fast enough. °· Numbness or altered sensitivity in the area being iced. °If you have any of the following conditions, do not use ice until you have discussed cryotherapy with your caregiver: °· Heart conditions, such as arrhythmia, angina, or chronic heart disease. °· High blood pressure. °· Healing wounds or open skin in the area being iced. °· Current infections. °· Rheumatoid arthritis. °· Poor circulation. °· Diabetes. °Ice slows the blood flow in the region it is applied. This is beneficial when trying to stop inflamed tissues from spreading irritating chemicals to surrounding tissues. However, if you expose your skin to cold temperatures for too long or without the proper protection, you can damage your skin or nerves. Watch for signs of skin damage due to cold. °HOME CARE INSTRUCTIONS °Follow these tips to use ice and cold packs safely. °· Place a dry or damp towel between the ice and skin. A damp towel will cool the skin  more quickly, so you may need to shorten the time that the ice is used. °· For a more rapid response, add gentle compression to the ice. °· Ice for no more than 10 to 20 minutes at a time. The bonier the area you are icing, the less time it will take to get the benefits of ice. °· Check your skin after 5 minutes to make sure there are no signs of a poor response to cold or skin damage. °· Rest 20 minutes or more between uses. °· Once your skin is numb, you can end your treatment. You can test numbness by very lightly touching your skin. The touch should be so light that you do not see the skin dimple from the pressure of your fingertip. When using ice, most people will feel these normal sensations in this order: cold, burning, aching, and numbness. °· Do not use ice on someone who cannot communicate their responses to pain,   such as small children or people with dementia. °HOW TO MAKE AN ICE PACK °Ice packs are the most common way to use ice therapy. Other methods include ice massage, ice baths, and cryosprays. Muscle creams that cause a cold, tingly feeling do not offer the same benefits that ice offers and should not be used as a substitute unless recommended by your caregiver. °To make an ice pack, do one of the following: °· Place crushed ice or a bag of frozen vegetables in a sealable plastic bag. Squeeze out the excess air. Place this bag inside another plastic bag. Slide the bag into a pillowcase or place a damp towel between your skin and the bag. °· Mix 3 parts water with 1 part rubbing alcohol. Freeze the mixture in a sealable plastic bag. When you remove the mixture from the freezer, it will be slushy. Squeeze out the excess air. Place this bag inside another plastic bag. Slide the bag into a pillowcase or place a damp towel between your skin and the bag. °SEEK MEDICAL CARE IF: °· You develop white spots on your skin. This may give the skin a blotchy (mottled) appearance. °· Your skin turns blue or  pale. °· Your skin becomes waxy or hard. °· Your swelling gets worse. °MAKE SURE YOU:  °· Understand these instructions. °· Will watch your condition. °· Will get help right away if you are not doing well or get worse. °Document Released: 05/16/2011 Document Revised: 02/03/2014 Document Reviewed: 05/16/2011 °ExitCare® Patient Information ©2015 [employer]. This information is not intended to replace advice given to you by your health care provider. Make sure you discuss any questions you have with your health care provider. ° °

## 2015-06-08 NOTE — ED Provider Notes (Signed)
CSN: [id number]     Arrival date & time 06/08/15  2004 History  This chart was scribed for Charlann Lange, PA-C, working with Merrily Pew, MD by Starleen Arms, ED Scribe. This patient was seen in room WTR8/WTR8 and the patient's care was started at 10:22 PM.   Chief Complaint  Patient presents with  . Ankle Injury   The history is provided by the patient. No language interpreter was used.   HPI Comments: Brittany Smith is a 39 y.o. female who presents to the Emergency Department complaining of a left ankle injury onset PTA with associate, constant, moderate, immediate onset ankle pain and swelling.  The patient reports she was carrying her young daughter and fell backward, but she is unsure how she injured her ankle.  She denies other injury during the fall.   History reviewed. No pertinent past medical history. Past Surgical History  Procedure Laterality Date  . Anterior cruciate ligament repair    . Tubal ligation    . Knee arthroscopy with anterior cruciate ligament (acl) repair     History reviewed. No pertinent family history. Social History  Substance Use Topics  . Smoking status: Former Smoker    Quit date: 10/05/2012  . Smokeless tobacco: None  . Alcohol Use: Yes     Comment: occasionally- weekends   OB History    No data available     Review of Systems  Constitutional: Negative for fever.  Musculoskeletal: Positive for joint swelling and arthralgias.      Allergies  Ceftriaxone and Tramadol  Home Medications   Prior to Admission medications   Not on File   BP 124/70 mmHg  Pulse 68  Temp(Src) 98 F (36.7 C) (Oral)  Resp 16  SpO2 100%  LMP 06/01/2015 (Exact Date) Physical Exam  Constitutional: She is oriented to person, place, and time. She appears well-developed and well-nourished. No distress.  HENT:  Head: Normocephalic and atraumatic.  Eyes: Conjunctivae and EOM are normal.  Neck: Neck supple. No tracheal deviation present.  Cardiovascular:  Normal rate.   Pulmonary/Chest: Effort normal. No respiratory distress.  Musculoskeletal: Normal range of motion.  Left ankle swollen laterally without discoloration or bony deformity.  Joint stable.  No calf tenderness.  FROM all digits.   Neurological: She is alert and oriented to person, place, and time.  Skin: Skin is warm and dry.  Psychiatric: She has a normal mood and affect. Her behavior is normal.  Nursing note and vitals reviewed.   ED Course  Procedures (including critical care time)  DIAGNOSTIC STUDIES: Oxygen Saturation is 100% on RA, normal by my interpretation.    COORDINATION OF CARE:  10:22 PM Discussed treatment plan with patient at bedside.  Patient acknowledges and agrees with plan.    Labs Review Labs Reviewed - No data to display  Imaging Review No results found. I have personally reviewed and evaluated these images and lab results as part of my medical decision-making.   EKG Interpretation None      MDM   Final diagnoses:  None    1. Left ankle sprain  Imaging is abnormal but discussed with radiologist who feels abnormality along medial malleolus appears old. Patient states she has had many injuries to this ankle. CAM walker applied. She will follow up with her doctor at [facility] orthopedics if pain is no better in 2-3 days.   I personally performed the services described in this documentation, which was scribed in my presence. The recorded information has  been reviewed and is accurate.      Charlann Lange, PA-C 06/09/15 7207  Merrily Pew, MD 06/09/15 1414

## 2015-06-26 ENCOUNTER — Encounter (HOSPITAL_COMMUNITY): Payer: Self-pay | Admitting: Emergency Medicine

## 2015-06-26 DIAGNOSIS — Y999 Unspecified external cause status: Secondary | ICD-10-CM | POA: Insufficient documentation

## 2015-06-26 DIAGNOSIS — S8255XA Nondisplaced fracture of medial malleolus of left tibia, initial encounter for closed fracture: Secondary | ICD-10-CM | POA: Insufficient documentation

## 2015-06-26 DIAGNOSIS — Z87891 Personal history of nicotine dependence: Secondary | ICD-10-CM | POA: Insufficient documentation

## 2015-06-26 DIAGNOSIS — Y929 Unspecified place or not applicable: Secondary | ICD-10-CM | POA: Insufficient documentation

## 2015-06-26 DIAGNOSIS — X58XXXA Exposure to other specified factors, initial encounter: Secondary | ICD-10-CM | POA: Insufficient documentation

## 2015-06-26 DIAGNOSIS — Y939 Activity, unspecified: Secondary | ICD-10-CM | POA: Insufficient documentation

## 2015-06-26 NOTE — ED Notes (Signed)
The patiet said she was seen at [place] two weeks ago and she was told she had an old fracture.  She was also told she had a sprain and was given a boot.  She was given ibuprofen and told the pain would go away.  It has gotten worse, and she has knot on the inside of her ankle now.  She is here to be seen.  Her pain level is 6/10.

## 2015-06-27 ENCOUNTER — Emergency Department (HOSPITAL_COMMUNITY)
Admission: EM | Admit: 2015-06-27 | Discharge: 2015-06-27 | Disposition: A | Discharge status: Home / Self Care | Payer: Self-pay | Attending: Emergency Medicine | Admitting: Emergency Medicine

## 2015-06-27 ENCOUNTER — Emergency Department (HOSPITAL_COMMUNITY): Payer: Self-pay

## 2015-06-27 DIAGNOSIS — S8252XA Displaced fracture of medial malleolus of left tibia, initial encounter for closed fracture: Secondary | ICD-10-CM

## 2015-06-27 MED ORDER — IBUPROFEN 800 MG PO TABS: 800.0000 mg | ORAL_TABLET | Freq: Three times a day (TID) | ORAL | Status: DC

## 2015-06-27 NOTE — ED Notes (Signed)
PA at bedside.

## 2015-06-27 NOTE — ED Notes (Signed)
Pt off unit with xray 

## 2015-06-27 NOTE — ED Provider Notes (Signed)
CSN: [id number]     Arrival date & time 06/26/15  2330 History   First MD Initiated Contact with Patient 06/27/15 0005     Chief Complaint  Patient presents with  . Ankle Pain    The patiet said she was seen at [facility] two weeks ago and she was told she had an old fracture.  She was also told she had a sprain and was given a boot.      (Consider location/radiation/quality/duration/timing/severity/associated sxs/prior Treatment) Patient is a 39 y.o. female presenting with ankle pain. The history is provided by the patient. No language interpreter was used.  Ankle Pain Location:  Ankle Time since incident:  3 weeks Injury: yes   Mechanism of injury comment:  Ankle inversion Ankle location:  L ankle Pain details:    Quality:  Aching and shooting   Radiates to: uo her leg.   Severity:  No pain   Onset quality:  Gradual   Timing:  Constant   Progression:  Unchanged Dislocation: no   Prior injury to area:  No Relieved by:  NSAIDs Worsened by:  Activity, adduction and bearing weight Associated symptoms: decreased ROM   Associated symptoms: no back pain, no fatigue, no fever, no itching, no muscle weakness, no neck pain, no numbness, no stiffness, no swelling and no tingling        History reviewed. No pertinent past medical history. Past Surgical History  Procedure Laterality Date  . Anterior cruciate ligament repair    . Tubal ligation    . Knee arthroscopy with anterior cruciate ligament (acl) repair     History reviewed. No pertinent family history. Social History  Substance Use Topics  . Smoking status: Former Smoker    Quit date: 10/05/2012  . Smokeless tobacco: None  . Alcohol Use: Yes     Comment: occasionally- weekends   OB History    No data available     Review of Systems  Constitutional: Negative for fever and fatigue.  Musculoskeletal: Positive for gait problem. Negative for back pain, stiffness and neck pain.  Skin: Negative for itching and wound.       Allergies  Ceftriaxone and Tramadol  Home Medications   Prior to Admission medications   Medication Sig Start Date End Date Taking? Authorizing Provider  ibuprofen (ADVIL,MOTRIN) 800 MG tablet Take 1 tablet (800 mg total) by mouth 3 (three) times daily. 06/27/15   Danyia Borunda, PA-C   BP 124/60 mmHg  Pulse 67  Temp(Src) 97.3 F (36.3 C) (Oral)  Resp 12  Ht 5\' 11"  (1.803 m)  Wt 155 lb (70.308 kg)  BMI 21.63 kg/m2  SpO2 100%  LMP 06/23/2015 Physical Exam  Constitutional: She is oriented to person, place, and time. She appears well-developed and well-nourished. No distress.  HENT:  Head: Normocephalic and atraumatic.  Eyes: Conjunctivae are normal. No scleral icterus.  Neck: Normal range of motion.  Cardiovascular: Normal rate, regular rhythm and normal heart sounds.  Exam reveals no gallop and no friction rub.   No murmur heard. Pulmonary/Chest: Effort normal and breath sounds normal. No respiratory distress.  Abdominal: Soft. Bowel sounds are normal. She exhibits no distension and no mass. There is no tenderness. There is no guarding.  Musculoskeletal:  Patient with prominent and tender medial malleolus.   Neurological: She is alert and oriented to person, place, and time.  Skin: Skin is warm and dry. She is not diaphoretic.    ED Course  Procedures (including critical care time) Labs Review  Labs Reviewed - No data to display  Imaging Review Dg Ankle 2 Views Left  06/27/2015   CLINICAL DATA:  Follow up left ankle sprain x 2 wks ago, pt states she still has pain and swelling especially medial side of ankle. Pt states pain and swelling are about the same but no worse. No new injury  EXAM: LEFT ANKLE - 2 VIEW  COMPARISON:  06/08/2015  FINDINGS: Probable nondisplaced fracture of the medial malleolus consistent with avulsion injury. 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 Mild soft tissue swelling. No acute displaced fractures identified. No focal bone lesion or  bone destruction.  IMPRESSION: Nondisplaced fracture of the medial malleolus consistent with avulsion injury. 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 Soft tissue swelling is decreasing since previous study.   Electronically Signed   By: Lucienne Capers M.D.   On: 06/27/2015 01:49   I have personally reviewed and evaluated these images and lab results as part of my medical decision-making.   EKG Interpretation None      MDM   Final diagnoses:  Fractured medial malleolus, left, closed, initial encounter      Patient with nondisplaced fracture of the medial malleolus. She has a cam walker and crutches.  Will need follow up with ortho.    Margarita Mail, PA-C 06/28/15 2015  Carmin Muskrat, MD 06/29/15 [phone]

## 2015-06-27 NOTE — ED Notes (Signed)
Patient returned from X-ray 

## 2015-06-27 NOTE — ED Notes (Signed)
Patient transported to X-ray 

## 2015-06-27 NOTE — Discharge Instructions (Signed)
Ankle Fracture  A fracture is a break in a bone. The ankle joint is made up of three bones. These include the lower (distal)sections of your lower leg bones, called the tibia and fibula, along with a bone in your foot, called the talus. Depending on how bad the break is and if more than one ankle joint bone is broken, a cast or splint is used to protect and keep your injured bone from moving while it heals. Sometimes, surgery is required to help the fracture heal properly.   There are two general types of fractures:   Stable fracture. This includes a single fracture line through one bone, with no injury to ankle ligaments. A fracture of the talus that does not have any displacement (movement of the bone on either side of the fracture line) is also stable.   Unstable fracture. This includes more than one fracture line through one or more bones in the ankle joint. It also includes fractures that have displacement of the bone on either side of the fracture line.  CAUSES   A direct blow to the ankle.    Quickly and severely twisting your ankle.   Trauma, such as a car accident or falling from a significant height.  RISK FACTORS  You may be at a higher risk of ankle fracture if:   You have certain medical conditions.   You are involved in high-impact sports.   You are involved in a high-impact car accident.  SIGNS AND SYMPTOMS    Tender and swollen ankle.   Bruising around the injured ankle.   Pain on movement of the ankle.   Difficulty walking or putting weight on the ankle.   A cold foot below the site of the ankle injury. This can occur if the blood vessels passing through your injured ankle were also damaged.   Numbness in the foot below the site of the ankle injury.  DIAGNOSIS   An ankle fracture is usually diagnosed with a physical exam and X-rays. A CT scan may also be required for complex fractures.  TREATMENT   Stable fractures are treated with a cast or splint and using crutches to avoid putting  weight on your injured ankle. This is followed by an ankle strengthening program. Some patients require a special type of cast, depending on other medical problems they may have. Unstable fractures require surgery to ensure the bones heal properly. Your health care provider will tell you what type of fracture you have and the best treatment for your condition.  HOME CARE INSTRUCTIONS    Review correct crutch use with your health care provider and use your crutches as directed. Safe use of crutches is extremely important. Misuse of crutches can cause you to fall or cause injury to nerves in your hands or armpits.   Do not put weight or pressure on the injured ankle until directed by your health care provider.   To lessen the swelling, keep the injured leg elevated while sitting or lying down.   Apply ice to the injured area:   Put ice in a plastic bag.   Place a towel between your cast and the bag.   Leave the ice on for 20 minutes, 2-3 times a day.   If you have a plaster or fiberglass cast:   Do not try to scratch the skin under the cast with any objects. This can increase your risk of skin infection.   Check the skin around the cast every day. You   may put lotion on any red or sore areas.   Keep your cast dry and clean.   If you have a plaster splint:   Wear the splint as directed.   You may loosen the elastic around the splint if your toes become numb, tingle, or turn cold or blue.   Do not put pressure on any part of your cast or splint; it may break. Rest your cast only on a pillow the first 24 hours until it is fully hardened.   Your cast or splint can be protected during bathing with a plastic bag sealed to your skin with medical tape. Do not lower the cast or splint into water.   Take medicines as directed by your health care provider. Only take over-the-counter or prescription medicines for pain, discomfort, or fever as directed by your health care provider.   Do not drive a vehicle until  your health care provider specifically tells you it is safe to do so.   If your health care provider has given you a follow-up appointment, it is very important to keep that appointment. Not keeping the appointment could result in a chronic or permanent injury, pain, and disability. If you have any problem keeping the appointment, call the facility for assistance.  SEEK MEDICAL CARE IF:  You develop increased swelling or discomfort.  SEEK IMMEDIATE MEDICAL CARE IF:    Your cast gets damaged or breaks.   You have continued severe pain.   You develop new pain or swelling after the cast was put on.   Your skin or toenails below the injury turn blue or gray.   Your skin or toenails below the injury feel cold, numb, or have loss of sensitivity to touch.   There is a bad smell or pus draining from under the cast.  MAKE SURE YOU:    Understand these instructions.   Will watch your condition.   Will get help right away if you are not doing well or get worse.  Document Released: 09/16/2000 Document Revised: 09/24/2013 Document Reviewed: 04/18/2013  ExitCare Patient Information 2015 [employer]. This information is not intended to replace advice given to you by your health care provider. Make sure you discuss any questions you have with your health care provider.

## 2015-10-28 ENCOUNTER — Encounter (HOSPITAL_COMMUNITY): Payer: Self-pay | Admitting: Emergency Medicine

## 2015-10-28 ENCOUNTER — Emergency Department (HOSPITAL_COMMUNITY)
Admission: EM | Admit: 2015-10-28 | Discharge: 2015-10-28 | Disposition: A | Discharge status: Home / Self Care | Payer: Self-pay | Attending: Emergency Medicine | Admitting: Emergency Medicine

## 2015-10-28 DIAGNOSIS — K0889 Other specified disorders of teeth and supporting structures: Secondary | ICD-10-CM | POA: Insufficient documentation

## 2015-10-28 DIAGNOSIS — Z87891 Personal history of nicotine dependence: Secondary | ICD-10-CM | POA: Insufficient documentation

## 2015-10-28 DIAGNOSIS — K006 Disturbances in tooth eruption: Secondary | ICD-10-CM | POA: Insufficient documentation

## 2015-10-28 DIAGNOSIS — Z791 Long term (current) use of non-steroidal anti-inflammatories (NSAID): Secondary | ICD-10-CM | POA: Insufficient documentation

## 2015-10-28 MED ORDER — ONDANSETRON HCL 4 MG PO TABS: 4.0000 mg | ORAL_TABLET | Freq: Three times a day (TID) | ORAL | Status: DC | PRN

## 2015-10-28 MED ORDER — OXYCODONE-ACETAMINOPHEN 5-325 MG PO TABS: 1.0000 | ORAL_TABLET | ORAL | Status: DC | PRN

## 2015-10-28 NOTE — Discharge Instructions (Signed)
Impacted Molar Molars are the teeth in the back of your mouth. When they push out from the gum and grow (erupt), they can become trapped inside the gum, or they may only partially come through the gum surface (impacted). Molars erupt at different times in life. The first set of molars usually erupts around 20-40 years of age. The second set of molars typically erupts around 51-74 years of age. The third set of molars usually erupts around 16-68 years of age. This set of molars is often referred to as wisdom teeth. Wisdom teeth often become impacted, but any molar or set of molars can become impacted. Impacted molars may increase the risk of developing:   Infection.  Damage to nearby teeth.  Growth of fluid-filled sacs (cysts).  Long-lasting (chronic) discomfort.  Inflammation of the surrounding gum tissue (pericoronitis). CAUSES Common causes of this condition include having crowded teeth or a small mouth. This means that there may not be space for the molar to grow into. Other causes include a cyst or a mass (tumor). SYMPTOMS Symptoms of this condition include:  Pain.  Swelling, redness, or inflammation near the impacted tooth or teeth.  A stiff jaw.  Bad breath.  A gap between the teeth.  Difficulty opening your mouth.  A headache or jaw ache.  Swollen lymph nodes.  A bad taste in your mouth. In some cases, there are no symptoms. DIAGNOSIS This condition can be diagnosed with an oral exam and X-rays.  TREATMENT This condition is often treated by removing (extracting) the impacted molar or molars. Other treatment options include:  A procedure to remove the gum tissue that covers the impacted molar.  Repositioning the teeth so there is room for the molar to come through. This may be done with orthodontic appliances, such as braces.  Antibiotic medicines, if your impacted molar or set of impacted molars has become infected. Treatment may not be needed if you do not have  any symptoms. Talk with your health care provider about what is best for you. HOME CARE INSTRUCTIONS  Take medicines only as directed by your health care provider.  If you were prescribed antibiotic medicine, finish all of it even if you start to feel better.  If directed, apply ice to the painful area:  Put ice in a plastic bag.  Place a towel between your skin and the bag.  Leave the ice on for 20 minutes, 2-3 times a day.  Keep all follow-up visits as directed by your health care provider. This is important.  Your health care provider may recommend a salt-water rinse or give you an antibacterial solution to help with pain, infection, or inflammation. Rinse your mouth as directed by your health care provider.  You can make a salt-water rinse by mixing one teaspoon of salt in two cups of warm water. SEEK MEDICAL CARE IF:  You have a fever.  Your symptoms get worse.  Your pain is not controlled with medicine.  You have new:  Facial swelling.  Swelling along your gums.  You have difficulty opening your mouth.  You have difficulty swallowing.  You have new symptoms.   This information is not intended to replace advice given to you by your health care provider. Make sure you discuss any questions you have with your health care provider.   Document Released: 05/18/2011 Document Revised: 02/03/2015 Document Reviewed: 09/15/2014 Elsevier Interactive Patient Education 2016 [employer]  Community Service Learning  [facility]  [address]  [place], [place] [address]  Phone [phone]  The [facility] in [place], [place], exemplifies the [employer] vision to improve the health and quality of life of all [place] by Regulatory affairs officer with a passion to care for the underserved and by leading the nation in  community-based, service learning oral health education. We are committed to offering comprehensive general dental services for adults, children and special needs patients in a safe, caring and professional setting.  Appointments: Our clinic is open Monday through Friday 8:00 a.m. until 5:00 p.m. The amount of time scheduled for an appointment depends on the patients specific needs. We ask that you keep your appointed time for care or provide 24-hour notice of all appointment changes. Parents or legal guardians must accompany minor children.  Payment for Services: Medicaid and other insurance plans are welcome. Payment for services is due when services are rendered and may be made by cash or credit card. If you have dental insurance, we will assist you with your claim submission.   Emergencies: Emergency services will be provided Monday through Friday on a walk-in basis. Please arrive early for emergency services. After hours emergency services will be provided for patients of record as required.  Services:  [facility] Dentistry  Oral Surgery - Extractions  Root Canals  Sealants and Tooth Colored Fillings  Crowns and Bridges  Dentures and Partial Dentures  Implant Services  Periodontal Services and Cleanings  Cosmetic Statistician  3-D/[facility]

## 2015-10-28 NOTE — ED Provider Notes (Signed)
CSN: [id number]     Arrival date & time 10/28/15  1841 History   First MD Initiated Contact with Patient 10/28/15 1901     Chief Complaint  Patient presents with  . Dental Pain     (Consider location/radiation/quality/duration/timing/severity/associated sxs/prior Treatment) Patient is a 40 y.o. female presenting with tooth pain. The history is provided by the patient. No language interpreter was used.  Dental Pain Location:  Upper Upper teeth location:  3/RU 1st molar and 16/LU 3rd molar Quality:  Throbbing and constant Severity:  Severe Onset quality:  Gradual Duration:  1 week Timing:  Constant Progression:  Worsening Chronicity:  New Context comment:  Erupting 3rd molars Relieved by:  Nothing Ineffective treatments:  Acetaminophen, rest, topical anesthetic gel and NSAIDs Associated symptoms: facial pain and gum swelling   Associated symptoms: no congestion, no drooling, no fever and no neck swelling   Risk factors: no smoking     History reviewed. No pertinent past medical history. Past Surgical History  Procedure Laterality Date  . Anterior cruciate ligament repair    . Tubal ligation    . Knee arthroscopy with anterior cruciate ligament (acl) repair     No family history on file. Social History  Substance Use Topics  . Smoking status: Former Smoker    Quit date: 10/05/2012  . Smokeless tobacco: None  . Alcohol Use: Yes     Comment: occasionally- weekends   OB History    No data available     Review of Systems  Constitutional: Negative for fever.  HENT: Positive for dental problem. Negative for congestion and drooling.   All other systems reviewed and are negative.     Allergies  Ceftriaxone and Tramadol  Home Medications   Prior to Admission medications   Medication Sig Start Date End Date Taking? Authorizing Provider  ibuprofen (ADVIL,MOTRIN) 800 MG tablet Take 1 tablet (800 mg total) by mouth 3 (three) times daily. 06/27/15   Margarita Mail, PA-C    ondansetron (ZOFRAN) 4 MG tablet Take 1 tablet (4 mg total) by mouth every 8 (eight) hours as needed for nausea or vomiting. 10/28/15   Margarita Mail, PA-C  oxyCODONE-acetaminophen (PERCOCET) 5-325 MG tablet Take 1-2 tablets by mouth every 4 (four) hours as needed. 10/28/15   Rella Egelston, PA-C   BP 114/65 mmHg  Pulse 71  Temp(Src) 98.3 F (36.8 C) (Oral)  Resp 20  Ht 5\' 10"  (1.778 m)  Wt 70.308 kg  BMI 22.24 kg/m2  SpO2 100%  LMP 10/28/2015 Physical Exam  Constitutional: She is oriented to person, place, and time. She appears well-developed and well-nourished. No distress.  HENT:  Head: Normocephalic and atraumatic.  Mouth/Throat:    Eyes: Conjunctivae are normal. No scleral icterus.  Neck: Normal range of motion.  Cardiovascular: Normal rate, regular rhythm and normal heart sounds.  Exam reveals no gallop and no friction rub.   No murmur heard. Pulmonary/Chest: Effort normal and breath sounds normal. No respiratory distress.  Abdominal: Soft. Bowel sounds are normal. She exhibits no distension and no mass. There is no tenderness. There is no guarding.  Neurological: She is alert and oriented to person, place, and time.  Skin: Skin is warm and dry. She is not diaphoretic.  Vitals reviewed.   ED Course  Procedures (including critical care time) Labs Review Labs Reviewed - No data to display  Imaging Review No results found. I have personally reviewed and evaluated these images and lab results as part of my medical decision-making.  EKG Interpretation None      MDM   Final diagnoses:  Late tooth eruption  Dentalgia    Patient with erupting Wisdom teeth. No signs of infection.  No gross abscess.  Exam unconcerning for Ludwig's angina or spread of infection.  Will treat with penicillin and pain medicine.  Urged patient to follow-up with dentist.        Margarita Mail, PA-C 11/01/15 1129  Virgel Manifold, MD 11/01/15 2132

## 2015-11-10 ENCOUNTER — Emergency Department (HOSPITAL_COMMUNITY)
Admission: EM | Admit: 2015-11-10 | Discharge: 2015-11-10 | Disposition: A | Discharge status: Home / Self Care | Payer: Self-pay | Attending: Emergency Medicine | Admitting: Emergency Medicine

## 2015-11-10 ENCOUNTER — Encounter (HOSPITAL_COMMUNITY): Payer: Self-pay

## 2015-11-10 DIAGNOSIS — Z3202 Encounter for pregnancy test, result negative: Secondary | ICD-10-CM | POA: Insufficient documentation

## 2015-11-10 DIAGNOSIS — K921 Melena: Secondary | ICD-10-CM | POA: Insufficient documentation

## 2015-11-10 DIAGNOSIS — Z9851 Tubal ligation status: Secondary | ICD-10-CM | POA: Insufficient documentation

## 2015-11-10 DIAGNOSIS — Z87891 Personal history of nicotine dependence: Secondary | ICD-10-CM | POA: Insufficient documentation

## 2015-11-10 LAB — I-STAT BETA HCG BLOOD, ED (MC, WL, AP ONLY)

## 2015-11-10 LAB — URINALYSIS, ROUTINE W REFLEX MICROSCOPIC
Bilirubin Urine: NEGATIVE
GLUCOSE, UA: NEGATIVE mg/dL
Hgb urine dipstick: NEGATIVE
KETONES UR: NEGATIVE mg/dL
LEUKOCYTES UA: NEGATIVE
NITRITE: NEGATIVE
PROTEIN: NEGATIVE mg/dL
Specific Gravity, Urine: 1.02 (ref 1.005–1.030)
pH: 7 (ref 5.0–8.0)

## 2015-11-10 LAB — COMPREHENSIVE METABOLIC PANEL
ALT: 10 U/L — AB (ref 14–54)
AST: 17 U/L (ref 15–41)
Albumin: 4.2 g/dL (ref 3.5–5.0)
Alkaline Phosphatase: 48 U/L (ref 38–126)
Anion gap: 9 (ref 5–15)
BILIRUBIN TOTAL: 0.7 mg/dL (ref 0.3–1.2)
BUN: 8 mg/dL (ref 6–20)
CHLORIDE: 106 mmol/L (ref 101–111)
CO2: 26 mmol/L (ref 22–32)
CREATININE: 0.66 mg/dL (ref 0.44–1.00)
Calcium: 9.1 mg/dL (ref 8.9–10.3)
Glucose, Bld: 100 mg/dL — ABNORMAL HIGH (ref 65–99)
Potassium: 4.1 mmol/L (ref 3.5–5.1)
SODIUM: 141 mmol/L (ref 135–145)
TOTAL PROTEIN: 6.7 g/dL (ref 6.5–8.1)

## 2015-11-10 LAB — CBC
HCT: 38.5 % (ref 36.0–46.0)
Hemoglobin: 12.8 g/dL (ref 12.0–15.0)
MCH: 27.9 pg (ref 26.0–34.0)
MCHC: 33.2 g/dL (ref 30.0–36.0)
MCV: 83.9 fL (ref 78.0–100.0)
PLATELETS: 290 10*3/uL (ref 150–400)
RBC: 4.59 MIL/uL (ref 3.87–5.11)
RDW: 15.1 % (ref 11.5–15.5)
WBC: 8.2 10*3/uL (ref 4.0–10.5)

## 2015-11-10 LAB — LIPASE, BLOOD: Lipase: 20 U/L (ref 11–51)

## 2015-11-10 NOTE — ED Provider Notes (Signed)
CSN: [id number]     Arrival date & time 11/10/15  1051 History   First MD Initiated Contact with Patient 11/10/15 1339     Chief Complaint  Patient presents with  . Rectal Bleeding  . Abdominal Pain     (Consider location/radiation/quality/duration/timing/severity/associated sxs/prior Treatment) Patient is a 40 y.o. female presenting with abdominal pain. The history is provided by the patient.  Abdominal Pain Pain location:  LLQ and RLQ Pain quality: cramping   Pain radiates to:  Does not radiate Pain severity:  Mild Onset quality:  Gradual Duration:  2 days Timing:  Intermittent Progression:  Waxing and waning Chronicity:  New Relieved by:  Nothing Worsened by:  Nothing tried Ineffective treatments:  None tried Associated symptoms: hematochezia   Associated symptoms: no chest pain, no chills, no dysuria, no fever, no nausea, no shortness of breath and no vomiting     40 yo F with a chief complaints of bright red blood per rectum. Patient states that this was mixed in with her stool. Brown stool is not dark or tarry. Patient having some lower abdominal cramping. Denies any vaginal bleeding or vaginal discharge. Denies dysuria. Denies fevers nausea or vomiting. Pain is not radiating anywhere. Nothing seems to make it better or worse.  History reviewed. No pertinent past medical history. Past Surgical History  Procedure Laterality Date  . Anterior cruciate ligament repair    . Tubal ligation    . Knee arthroscopy with anterior cruciate ligament (acl) repair     History reviewed. No pertinent family history. Social History  Substance Use Topics  . Smoking status: Former Smoker    Quit date: 10/05/2012  . Smokeless tobacco: None  . Alcohol Use: Yes     Comment: occasionally- weekends   OB History    No data available     Review of Systems  Constitutional: Negative for fever and chills.  HENT: Negative for congestion and rhinorrhea.   Eyes: Negative for redness and  visual disturbance.  Respiratory: Negative for shortness of breath and wheezing.   Cardiovascular: Negative for chest pain and palpitations.  Gastrointestinal: Positive for abdominal pain (lower cramping) and hematochezia. Negative for nausea and vomiting.  Genitourinary: Negative for dysuria and urgency.  Musculoskeletal: Negative for myalgias and arthralgias.  Skin: Negative for pallor and wound.  Neurological: Negative for dizziness and headaches.      Allergies  Ceftriaxone and Tramadol  Home Medications   Prior to Admission medications   Medication Sig Start Date End Date Taking? Authorizing Provider  ibuprofen (ADVIL,MOTRIN) 800 MG tablet Take 1 tablet (800 mg total) by mouth 3 (three) times daily. Patient not taking: Reported on 11/10/2015 06/27/15   Margarita Mail, PA-C  ondansetron (ZOFRAN) 4 MG tablet Take 1 tablet (4 mg total) by mouth every 8 (eight) hours as needed for nausea or vomiting. Patient not taking: Reported on 11/10/2015 10/28/15   Margarita Mail, PA-C  oxyCODONE-acetaminophen (PERCOCET) 5-325 MG tablet Take 1-2 tablets by mouth every 4 (four) hours as needed. Patient not taking: Reported on 11/10/2015 10/28/15   Margarita Mail, PA-C   BP 111/52 mmHg  Pulse 50  Temp(Src) 98.3 F (36.8 C) (Oral)  Resp 14  SpO2 97%  LMP 10/28/2015 Physical Exam  Constitutional: She is oriented to person, place, and time. She appears well-developed and well-nourished. No distress.  HENT:  Head: Normocephalic and atraumatic.  Eyes: EOM are normal. Pupils are equal, round, and reactive to light.  Neck: Normal range of motion. Neck supple.  Cardiovascular: Normal rate and regular rhythm.  Exam reveals no gallop and no friction rub.   No murmur heard. Pulmonary/Chest: Effort normal. She has no wheezes. She has no rales.  Abdominal: Soft. She exhibits no distension. There is no tenderness. There is no rebound and no guarding.  Benign abdominal exam  Genitourinary:  Small hemorrhoids  nonbleeding. No gross blood on exam no dark stool.  Musculoskeletal: She exhibits no edema or tenderness.  Neurological: She is alert and oriented to person, place, and time.  Skin: Skin is warm and dry. She is not diaphoretic.  Psychiatric: She has a normal mood and affect. Her behavior is normal.  Nursing note and vitals reviewed.   ED Course  Procedures (including critical care time) Labs Review Labs Reviewed  COMPREHENSIVE METABOLIC PANEL - Abnormal; Notable for the following:    Glucose, Bld 100 (*)    ALT 10 (*)    All other components within normal limits  URINALYSIS, ROUTINE W REFLEX MICROSCOPIC (NOT AT [facility]) - Abnormal; Notable for the following:    APPearance CLOUDY (*)    All other components within normal limits  LIPASE, BLOOD  CBC  I-STAT BETA HCG BLOOD, ED ([facility], [facility], [facility] ONLY)    Imaging Review No results found. I have personally reviewed and evaluated these images and lab results as part of my medical decision-making.   EKG Interpretation None      MDM   Final diagnoses:  Blood in stool    64 yoF with a chief complaint of bright red blood in her stool. Hemoglobin is stable here. Benign abdominal exam. Afebrile. Doubt diverticulitis or significant acute GI bleed. Patient with a small hemorrhoid with no signs of bleeding. Will have her follow with her family doctor.   I have discussed the diagnosis/risks/treatment options with the patient and believe the pt to be eligible for discharge home to follow-up with PCP. We also discussed returning to the ED immediately if new or worsening sx occur. We discussed the sx which are most concerning (e.g., sudden worsening pain, fever, inability to tolerate by mouth) that necessitate immediate return. Medications administered to the patient during their visit and any new prescriptions provided to the patient are listed below.  Medications given during this visit Medications - No data to display  Discharge Medication List as  of 11/10/2015  1:48 PM      The patient appears reasonably screen and/or stabilized for discharge and I doubt any other medical condition or other [facility] requiring further screening, evaluation, or treatment in the ED at this time prior to discharge.      Deno Etienne, DO 11/10/15 1526

## 2015-11-10 NOTE — Discharge Instructions (Signed)
Gastrointestinal Bleeding °Gastrointestinal bleeding is bleeding somewhere along the path that food travels through the body (digestive tract). This path is anywhere between the mouth and the opening of the butt (anus). You may have blood in your throw up (vomit) or in your poop (stools). If there is a lot of bleeding, you may need to stay in the hospital. °HOME CARE °· Only take medicine as told by your doctor. °· Eat foods with fiber such as whole grains, fruits, and vegetables. You can also try eating 1 to 3 prunes a day. °· Drink enough fluids to keep your pee (urine) clear or pale yellow. °GET HELP RIGHT AWAY IF:  °· Your bleeding gets worse. °· You feel dizzy, weak, or you pass out (faint). °· You have bad cramps in your back or belly (abdomen). °· You have large blood clumps (clots) in your poop. °· Your problems are getting worse. °MAKE SURE YOU:  °· Understand these instructions. °· Will watch your condition. °· Will get help right away if you are not doing well or get worse. °  °This information is not intended to replace advice given to you by your health care provider. Make sure you discuss any questions you have with your health care provider. °  °Document Released: 06/28/2008 Document Revised: 09/05/2012 Document Reviewed: 03/09/2015 °Elsevier Interactive Patient Education ©2016 [employer] Inc[employer] ° °

## 2015-11-10 NOTE — ED Notes (Addendum)
Pt states not straining last night while having bm.  Stood up and found blood in stool.  Happened once before but not as bad.  Also c/o abdominal pain

## 2015-12-12 ENCOUNTER — Encounter (HOSPITAL_COMMUNITY): Payer: Self-pay | Admitting: *Deleted

## 2015-12-12 ENCOUNTER — Emergency Department (HOSPITAL_COMMUNITY)
Admission: EM | Admit: 2015-12-12 | Discharge: 2015-12-12 | Disposition: A | Discharge status: Home / Self Care | Payer: Self-pay | Attending: Emergency Medicine | Admitting: Emergency Medicine

## 2015-12-12 DIAGNOSIS — K0889 Other specified disorders of teeth and supporting structures: Secondary | ICD-10-CM | POA: Insufficient documentation

## 2015-12-12 DIAGNOSIS — Z87891 Personal history of nicotine dependence: Secondary | ICD-10-CM | POA: Insufficient documentation

## 2015-12-12 MED ORDER — KETOROLAC TROMETHAMINE 60 MG/2ML IM SOLN
60.0000 mg | Freq: Once | INTRAMUSCULAR | Status: AC
  Administered 2015-12-12: 60 mg via INTRAMUSCULAR
  Filled 2015-12-12: qty 2

## 2015-12-12 MED ORDER — PENICILLIN V POTASSIUM 500 MG PO TABS: 500.0000 mg | ORAL_TABLET | Freq: Four times a day (QID) | ORAL | Status: AC

## 2015-12-12 MED ORDER — PENICILLIN V POTASSIUM 250 MG PO TABS
500.0000 mg | ORAL_TABLET | Freq: Once | ORAL | Status: AC
  Administered 2015-12-12: 500 mg via ORAL
  Filled 2015-12-12: qty 2

## 2015-12-12 MED ORDER — IBUPROFEN 600 MG PO TABS: 600.0000 mg | ORAL_TABLET | Freq: Four times a day (QID) | ORAL | Status: DC | PRN

## 2015-12-12 NOTE — ED Provider Notes (Signed)
CSN: [id number]     Arrival date & time 12/12/15  1237 History   By signing my name below, I, Brittany Smith, attest that this documentation has been prepared under the direction and in the presence of Quincy Boy, PA-C.  Electronically Signed: Nicole Smith, ED Scribe 12/12/2015 at 12:51 PM.    Chief Complaint  Patient presents with  . Dental Problem    The history is provided by the patient. No language interpreter was used.   HPI Comments: Brittany Smith is a 40 y.o. female who presents to the Emergency Department complaining of gradual onset, constant, upper, left sided dental pain, onset one day ago. Pt reports that the pain radiates into her face and has some swelling. Pt was seen in ED in January and was told that she had a wisdom tooth coming in. Pain is worsened with eating and biting down. She reports that the original pain hurt for a few days before alleviating. She took pain medication this morning with no relief to symptoms. No other worsening or alleviating factors noted. Pt denies fever, chills, congestion, rhinorrhea, or any other pertinent symptoms. Pt has not seen a dentist for follow up and states she does not currently have a dentist.   History reviewed. No pertinent past medical history. Past Surgical History  Procedure Laterality Date  . Anterior cruciate ligament repair    . Tubal ligation    . Knee arthroscopy with anterior cruciate ligament (acl) repair     History reviewed. No pertinent family history. Social History  Substance Use Topics  . Smoking status: Former Smoker    Quit date: 10/05/2012  . Smokeless tobacco: None  . Alcohol Use: Yes     Comment: occasionally- weekends   OB History    No data available     Review of Systems  Constitutional: Negative for fever and chills.  HENT: Positive for dental problem. Negative for congestion and rhinorrhea.   Skin: Negative for color change and wound.  Psychiatric/Behavioral: Negative for  confusion.     Allergies  Ceftriaxone and Tramadol  Home Medications   Prior to Admission medications   Medication Sig Start Date End Date Taking? Authorizing Provider  ibuprofen (ADVIL,MOTRIN) 800 MG tablet Take 1 tablet (800 mg total) by mouth 3 (three) times daily. Patient not taking: Reported on 11/10/2015 06/27/15   Margarita Mail, PA-C  ondansetron (ZOFRAN) 4 MG tablet Take 1 tablet (4 mg total) by mouth every 8 (eight) hours as needed for nausea or vomiting. Patient not taking: Reported on 11/10/2015 10/28/15   Margarita Mail, PA-C  oxyCODONE-acetaminophen (PERCOCET) 5-325 MG tablet Take 1-2 tablets by mouth every 4 (four) hours as needed. Patient not taking: Reported on 11/10/2015 10/28/15   Margarita Mail, PA-C   BP 143/68 mmHg  Pulse 53  Temp(Src) 98.4 F (36.9 C) (Oral)  Ht 5\' 10"  (1.778 m)  Wt 161 lb 7 oz (73.228 kg)  BMI 23.16 kg/m2  SpO2 100%  LMP 12/05/2015 Physical Exam  Constitutional: She is oriented to person, place, and time. She appears well-developed and well-nourished.  HENT:  Head: Normocephalic.  No obvious facial swelling. No trismus. Unremarkable dentition. Tenderness to palpation over left upper first and second molars. No swelling under the tongue. No tenderness over maxillary or frontal sinuses.  Eyes: EOM are normal.  Neck: Normal range of motion.  Pulmonary/Chest: Effort normal.  Abdominal: She exhibits no distension.  Musculoskeletal: Normal range of motion.  Neurological: She is alert and oriented to person, place,  and time.  Psychiatric: She has a normal mood and affect.  Nursing note and vitals reviewed.   ED Course  Procedures (including critical care time) DIAGNOSTIC STUDIES: Oxygen Saturation is 100% on RA, normal by my interpretation.    COORDINATION OF CARE: 1:00 PM-Discussed treatment plan which includes toradol, follow up with a dentist, and veetid with pt at bedside and pt agreed to plan.    Labs Review Labs Reviewed - No data to  display  Imaging Review No results found.   EKG Interpretation None      MDM   Final diagnoses:  Pain, dental   Patient with left upper tooth pain, was told it may be due to her wisdom teeth coming in 2 months ago. Patient never followed up with a dentist because her pain improved. At that time patient was treated with penicillin and Percocet. Patient states she took Percocet this morning it did not help. On exam I do not see any evidence suggesting possible Ludwig's angina. She has no trismus. She is tearful. Oral shot, home with penicillin for possible infection. Will refer to dentist  Filed Vitals:   12/12/15 1245  BP: 143/68  Pulse: 53  Temp: 98.4 F (36.9 C)  TempSrc: Oral  Height: 5\' 10"  (1.778 m)  Weight: 73.228 kg  SpO2: 100%     Jeannett Senior, PA-C 12/12/15 1351  Merrily Pew, MD 12/13/15 0800

## 2015-12-12 NOTE — ED Notes (Signed)
Pt reports pain to Lt upper tooth. PT reports taking a oxycodone for pain with out relief. Pt last seen in 10-2015 for same.

## 2015-12-12 NOTE — ED Notes (Signed)
Declined W/C at D/C and was escorted to lobby by RN. 

## 2015-12-12 NOTE — Discharge Instructions (Signed)
Start taking antibiotics as prescribed, take it UNTIL ALL GONE. Take ibuprofen for pain. Follow up with a dentist as referred.   Dental Pain Dental pain may be caused by many things, including:  Tooth decay (cavities or caries). Cavities expose the nerve of your tooth to air and hot or cold temperatures. This can cause pain or discomfort.  Abscess or infection. A dental abscess is a collection of infected pus from a bacterial infection in the inner part of the tooth (pulp). It usually occurs at the end of the tooth's root.  Injury.  An unknown reason (idiopathic). Your pain may be mild or severe. It may only occur when:  You are chewing.  You are exposed to hot or cold temperature.  You are eating or drinking sugary foods or beverages, such as soda or candy. Your pain may also be constant. HOME CARE INSTRUCTIONS Watch your dental pain for any changes. The following actions may help to lessen any discomfort that you are feeling:  Take medicines only as directed by your dentist.  If you were prescribed an antibiotic medicine, finish all of it even if you start to feel better.  Keep all follow-up visits as directed by your dentist. This is important.  Do not apply heat to the outside of your face.  Rinse your mouth or gargle with salt water if directed by your dentist. This helps with pain and swelling.  You can make salt water by adding  tsp of salt to 1 cup of warm water.  Apply ice to the painful area of your face:  Put ice in a plastic bag.  Place a towel between your skin and the bag.  Leave the ice on for 20 minutes, 2-3 times per day.  Avoid foods or drinks that cause you pain, such as:  Very hot or very cold foods or drinks.  Sweet or sugary foods or drinks. SEEK MEDICAL CARE IF:  Your pain is not controlled with medicines.  Your symptoms are worse.  You have new symptoms. SEEK IMMEDIATE MEDICAL CARE IF:  You are unable to open your mouth.  You are  having trouble breathing or swallowing.  You have a fever.  Your face, neck, or jaw is swollen.   This information is not intended to replace advice given to you by your health care provider. Make sure you discuss any questions you have with your health care provider.   Document Released: 09/19/2005 Document Revised: 02/03/2015 Document Reviewed: 09/15/2014 Elsevier Interactive Patient Education [employer].

## 2016-04-07 ENCOUNTER — Emergency Department (HOSPITAL_COMMUNITY)
Admission: EM | Admit: 2016-04-07 | Discharge: 2016-04-07 | Disposition: A | Discharge status: Home / Self Care | Payer: Self-pay | Attending: Emergency Medicine | Admitting: Emergency Medicine

## 2016-04-07 ENCOUNTER — Encounter (HOSPITAL_COMMUNITY): Payer: Self-pay | Admitting: Emergency Medicine

## 2016-04-07 DIAGNOSIS — Z87891 Personal history of nicotine dependence: Secondary | ICD-10-CM | POA: Insufficient documentation

## 2016-04-07 DIAGNOSIS — B349 Viral infection, unspecified: Secondary | ICD-10-CM | POA: Insufficient documentation

## 2016-04-07 DIAGNOSIS — Z79899 Other long term (current) drug therapy: Secondary | ICD-10-CM | POA: Insufficient documentation

## 2016-04-07 LAB — CBC
HEMATOCRIT: 37.2 % (ref 36.0–46.0)
Hemoglobin: 12.6 g/dL (ref 12.0–15.0)
MCH: 27.9 pg (ref 26.0–34.0)
MCHC: 33.9 g/dL (ref 30.0–36.0)
MCV: 82.3 fL (ref 78.0–100.0)
Platelets: 268 10*3/uL (ref 150–400)
RBC: 4.52 MIL/uL (ref 3.87–5.11)
RDW: 14.7 % (ref 11.5–15.5)
WBC: 11.3 10*3/uL — ABNORMAL HIGH (ref 4.0–10.5)

## 2016-04-07 LAB — COMPREHENSIVE METABOLIC PANEL
ALBUMIN: 4.1 g/dL (ref 3.5–5.0)
ALT: 9 U/L — ABNORMAL LOW (ref 14–54)
AST: 14 U/L — AB (ref 15–41)
Alkaline Phosphatase: 46 U/L (ref 38–126)
Anion gap: 5 (ref 5–15)
BUN: 14 mg/dL (ref 6–20)
CHLORIDE: 107 mmol/L (ref 101–111)
CO2: 24 mmol/L (ref 22–32)
Calcium: 8.6 mg/dL — ABNORMAL LOW (ref 8.9–10.3)
Creatinine, Ser: 0.69 mg/dL (ref 0.44–1.00)
GFR calc Af Amer: >60 mL/min (ref >60)
GFR calc non Af Amer: >60 mL/min (ref >60)
GLUCOSE: 94 mg/dL (ref 65–99)
POTASSIUM: 4 mmol/L (ref 3.5–5.1)
Sodium: 136 mmol/L (ref 135–145)
Total Bilirubin: 0.8 mg/dL (ref 0.3–1.2)
Total Protein: 6.5 g/dL (ref 6.5–8.1)

## 2016-04-07 LAB — URINALYSIS, ROUTINE W REFLEX MICROSCOPIC
BILIRUBIN URINE: NEGATIVE
Glucose, UA: NEGATIVE mg/dL
KETONES UR: NEGATIVE mg/dL
Leukocytes, UA: NEGATIVE
Nitrite: NEGATIVE
PROTEIN: NEGATIVE mg/dL
Specific Gravity, Urine: 1.03 (ref 1.005–1.030)
pH: 7 (ref 5.0–8.0)

## 2016-04-07 LAB — URINE MICROSCOPIC-ADD ON

## 2016-04-07 LAB — LIPASE, BLOOD: LIPASE: 25 U/L (ref 11–51)

## 2016-04-07 LAB — I-STAT BETA HCG BLOOD, ED (MC, WL, AP ONLY)

## 2016-04-07 LAB — RAPID STREP SCREEN (MED CTR MEBANE ONLY): Streptococcus, Group A Screen (Direct): NEGATIVE

## 2016-04-07 MED ORDER — KETOROLAC TROMETHAMINE 60 MG/2ML IM SOLN
60.0000 mg | Freq: Once | INTRAMUSCULAR | Status: AC
  Administered 2016-04-07: 60 mg via INTRAMUSCULAR
  Filled 2016-04-07: qty 2

## 2016-04-07 MED ORDER — ONDANSETRON 8 MG PO TBDP
8.0000 mg | ORAL_TABLET | Freq: Once | ORAL | Status: AC
  Administered 2016-04-07: 8 mg via ORAL
  Filled 2016-04-07: qty 1

## 2016-04-07 MED ORDER — IBUPROFEN 600 MG PO TABS: 600.0000 mg | ORAL_TABLET | Freq: Four times a day (QID) | ORAL | Status: DC | PRN

## 2016-04-07 MED ORDER — ONDANSETRON 8 MG PO TBDP: 8.0000 mg | ORAL_TABLET | Freq: Three times a day (TID) | ORAL | Status: DC | PRN

## 2016-04-07 NOTE — ED Notes (Signed)
Pt woke up at 0400 this morning with ear pain, sore throat, generalized body aches, and chills. C/o N/V/D, generalized right sided abdominal pain. Denies any urinary problems. "I feel like I got a summer flu."

## 2016-04-07 NOTE — Discharge Instructions (Signed)
Take ibuprofen or Tylenol for pain. Take Zofran as prescribed as needed for nausea and vomiting. Patient to drink plenty of fluids. Saltwater gargles several times a day. Follow-up with your family doctor. Return if worsening   Viral Infections A viral infection can be caused by different types of viruses.Most viral infections are not serious and resolve on their own. However, some infections may cause severe symptoms and may lead to further complications. SYMPTOMS Viruses can frequently cause:  Minor sore throat.  Aches and pains.  Headaches.  Runny nose.  Different types of rashes.  Watery eyes.  Tiredness.  Cough.  Loss of appetite.  Gastrointestinal infections, resulting in nausea, vomiting, and diarrhea. These symptoms do not respond to antibiotics because the infection is not caused by bacteria. However, you might catch a bacterial infection following the viral infection. This is sometimes called a "superinfection." Symptoms of such a bacterial infection may include:  Worsening sore throat with pus and difficulty swallowing.  Swollen neck glands.  Chills and a high or persistent fever.  Severe headache.  Tenderness over the sinuses.  Persistent overall ill feeling (malaise), muscle aches, and tiredness (fatigue).  Persistent cough.  Yellow, green, or brown mucus production with coughing. HOME CARE INSTRUCTIONS   Only take over-the-counter or prescription medicines for pain, discomfort, diarrhea, or fever as directed by your caregiver.  Drink enough water and fluids to keep your urine clear or pale yellow. Sports drinks can provide valuable electrolytes, sugars, and hydration.  Get plenty of rest and maintain proper nutrition. Soups and broths with crackers or rice are fine. SEEK IMMEDIATE MEDICAL CARE IF:   You have severe headaches, shortness of breath, chest pain, neck pain, or an unusual rash.  You have uncontrolled vomiting, diarrhea, or you are  unable to keep down fluids.  You or your child has an oral temperature above 102 F (38.9 C), not controlled by medicine.  Your baby is older than 3 months with a rectal temperature of 102 F (38.9 C) or higher.  Your baby is 1 months old or younger with a rectal temperature of 100.4 F (38 C) or higher. MAKE SURE YOU:   Understand these instructions.  Will watch your condition.  Will get help right away if you are not doing well or get worse.   This information is not intended to replace advice given to you by your health care provider. Make sure you discuss any questions you have with your health care provider.   Document Released: 06/29/2005 Document Revised: 12/12/2011 Document Reviewed: 02/25/2015 Elsevier Interactive Patient Education [employer].

## 2016-04-07 NOTE — ED Provider Notes (Signed)
CSN: [id number]     Arrival date & time 04/07/16  1157 History   First MD Initiated Contact with Patient 04/07/16 1232     Chief Complaint  Patient presents with  . Abdominal Pain  . Otalgia     (Consider location/radiation/quality/duration/timing/severity/associated sxs/prior Treatment) HPI Brittany Smith is a 40 y.o. female who presents to emergency department complaining of flulike symptoms. Patient states she woke up this morning with sore throat, nasal congestion, left ear pain, nausea, one episode of emesis, generalized body aches and chills. Patient states that she feels like "I have a summer flu." She has not taken any medications for her symptoms at home. She denies any cough. She denies taking her temperature at home but reports that she been feeling feverish. She did take TheraFlu early this morning around 4 AM. No medications taken since then. Denies back pain. She does admit to generalized abdominal pain. Denies any vaginal discharge or bleeding. Nothing making her symptoms better or worse.  History reviewed. No pertinent past medical history. Past Surgical History  Procedure Laterality Date  . Anterior cruciate ligament repair    . Tubal ligation    . Knee arthroscopy with anterior cruciate ligament (acl) repair     History reviewed. No pertinent family history. Social History  Substance Use Topics  . Smoking status: Former Smoker    Quit date: 10/05/2012  . Smokeless tobacco: None  . Alcohol Use: Yes     Comment: occasionally- weekends   OB History    No data available     Review of Systems  Constitutional: Positive for chills. Negative for fever.  HENT: Positive for congestion, ear pain and sore throat.   Eyes: Negative for photophobia.  Respiratory: Negative for cough, chest tightness and shortness of breath.   Cardiovascular: Negative for chest pain, palpitations and leg swelling.  Gastrointestinal: Positive for nausea, vomiting and abdominal pain.  Negative for diarrhea.  Genitourinary: Negative for dysuria, flank pain, vaginal bleeding, vaginal discharge, vaginal pain and pelvic pain.  Musculoskeletal: Positive for myalgias and arthralgias. Negative for neck pain and neck stiffness.  Skin: Negative for rash.  Neurological: Positive for headaches. Negative for dizziness and weakness.  All other systems reviewed and are negative.     Allergies  Ceftriaxone and Tramadol  Home Medications   Prior to Admission medications   Medication Sig Start Date End Date Taking? Authorizing Provider  Phenyleph-Doxylamine-DM-APAP (ALKA-SELTZER PLS NIGHT CLD/FLU) 5-6.25-10-325 MG CAPS Take 1 capsule by mouth at bedtime as needed (cold/flu symptoms.).   Yes Historical Provider, MD  ibuprofen (ADVIL,MOTRIN) 600 MG tablet Take 1 tablet (600 mg total) by mouth every 6 (six) hours as needed. Patient not taking: Reported on 04/07/2016 12/12/15   Jaliyah Fotheringham, PA-C  ondansetron (ZOFRAN) 4 MG tablet Take 1 tablet (4 mg total) by mouth every 8 (eight) hours as needed for nausea or vomiting. Patient not taking: Reported on 11/10/2015 10/28/15   Margarita Mail, PA-C  oxyCODONE-acetaminophen (PERCOCET) 5-325 MG tablet Take 1-2 tablets by mouth every 4 (four) hours as needed. Patient not taking: Reported on 11/10/2015 10/28/15   Margarita Mail, PA-C   BP 118/56 mmHg  Pulse 56  Temp(Src) 98 F (36.7 C) (Oral)  Resp 16  SpO2 100%  LMP 04/07/2016 Physical Exam  Constitutional: She is oriented to person, place, and time. She appears well-developed and well-nourished. No distress.  HENT:  Head: Normocephalic and atraumatic.  Right Ear: Tympanic membrane, external ear and ear canal normal.  Left Ear: Tympanic  membrane, external ear and ear canal normal.  Nose: Mucosal edema and rhinorrhea present.  Mouth/Throat: Uvula is midline and mucous membranes are normal. Posterior oropharyngeal erythema present. No oropharyngeal exudate or posterior oropharyngeal edema.   Eyes: Conjunctivae are normal.  Neck: Neck supple.  Cardiovascular: Normal rate, regular rhythm and normal heart sounds.   Pulmonary/Chest: Effort normal and breath sounds normal. No respiratory distress. She has no wheezes. She has no rales.  Abdominal: Soft. Bowel sounds are normal. She exhibits no distension. There is no tenderness. There is no rebound.  No CVA tenderness  Musculoskeletal: She exhibits no edema.  Neurological: She is alert and oriented to person, place, and time.  Skin: Skin is warm and dry.  Psychiatric: She has a normal mood and affect. Her behavior is normal.  Nursing note and vitals reviewed.   ED Course  Procedures (including critical care time) Labs Review Labs Reviewed  COMPREHENSIVE METABOLIC PANEL - Abnormal; Notable for the following:    Calcium 8.6 (*)    AST 14 (*)    ALT 9 (*)    All other components within normal limits  CBC - Abnormal; Notable for the following:    WBC 11.3 (*)    All other components within normal limits  URINALYSIS, ROUTINE W REFLEX MICROSCOPIC (NOT AT [facility]) - Abnormal; Notable for the following:    APPearance CLOUDY (*)    Hgb urine dipstick LARGE (*)    All other components within normal limits  URINE MICROSCOPIC-ADD ON - Abnormal; Notable for the following:    Squamous Epithelial / LPF 0-5 (*)    Bacteria, UA FEW (*)    All other components within normal limits  RAPID STREP SCREEN (NOT AT [facility])  CULTURE, GROUP A STREP ([place])  LIPASE, BLOOD  I-STAT BETA HCG BLOOD, ED ([facility], [facility], [facility] ONLY)    Imaging Review No results found. I have personally reviewed and evaluated these images and lab results as part of my medical decision-making.   EKG Interpretation None      MDM   Final diagnoses:  Viral syndrome    Patient emergency department with URI symptoms, one episode of nausea and vomiting, generalized abdominal pain. She mainly complaining of sore throat and left ear pain and body aches. She did not take any prior  to coming and other than TheraFlu at 4 in the morning. Will check basic labs, urinalysis, pregnancy test, will check strep. No evidence peritonsillar abscess. No meningismus. Abdomen is soft, doubt acute abdomen at this time.  2:46 PM Patient's lab work is unremarkable with normal electrolytes, slightly low calcium at 8.6. White count is slightly elevated 11.3. She denies any vaginal complaints, no abdominal tenderness, doubt PID. Most likely viral URI/syndrome. Will treat symptomatically at home. This time the meningismus. No surgical abdomen. Return precautions discussed.   Filed Vitals:   04/07/16 1207 04/07/16 1430  BP: 108/77 118/56  Pulse: 62 56  Temp: 98 F (36.7 C)   TempSrc: Oral   Resp: 18 16  SpO2: 100% 100%     Jeannett Senior, PA-C 04/07/16 [place], MD 04/08/16 [phone]

## 2016-04-08 LAB — URINE CULTURE

## 2016-04-09 LAB — CULTURE, GROUP A STREP (THRC)

## 2016-04-30 ENCOUNTER — Emergency Department (HOSPITAL_COMMUNITY)
Admission: EM | Admit: 2016-04-30 | Discharge: 2016-04-30 | Disposition: A | Discharge status: Home / Self Care | Payer: Self-pay | Attending: Emergency Medicine | Admitting: Emergency Medicine

## 2016-04-30 ENCOUNTER — Encounter (HOSPITAL_COMMUNITY): Payer: Self-pay | Admitting: Emergency Medicine

## 2016-04-30 ENCOUNTER — Emergency Department (HOSPITAL_COMMUNITY): Payer: Self-pay

## 2016-04-30 DIAGNOSIS — Z87891 Personal history of nicotine dependence: Secondary | ICD-10-CM | POA: Insufficient documentation

## 2016-04-30 DIAGNOSIS — R0789 Other chest pain: Secondary | ICD-10-CM | POA: Insufficient documentation

## 2016-04-30 LAB — BASIC METABOLIC PANEL
ANION GAP: 5 (ref 5–15)
BUN: 10 mg/dL (ref 6–20)
CHLORIDE: 111 mmol/L (ref 101–111)
CO2: 23 mmol/L (ref 22–32)
CREATININE: 0.91 mg/dL (ref 0.44–1.00)
Calcium: 9.6 mg/dL (ref 8.9–10.3)
GFR calc non Af Amer: >60 mL/min (ref >60)
GLUCOSE: 92 mg/dL (ref 65–99)
Potassium: 4.2 mmol/L (ref 3.5–5.1)
Sodium: 139 mmol/L (ref 135–145)

## 2016-04-30 LAB — CBC
HCT: 37.9 % (ref 36.0–46.0)
HEMOGLOBIN: 12.7 g/dL (ref 12.0–15.0)
MCH: 27.7 pg (ref 26.0–34.0)
MCHC: 33.5 g/dL (ref 30.0–36.0)
MCV: 82.8 fL (ref 78.0–100.0)
Platelets: 288 10*3/uL (ref 150–400)
RBC: 4.58 MIL/uL (ref 3.87–5.11)
RDW: 14.2 % (ref 11.5–15.5)
WBC: 8.7 10*3/uL (ref 4.0–10.5)

## 2016-04-30 LAB — I-STAT TROPONIN, ED: Troponin i, poc: 0 ng/mL (ref 0.00–0.08)

## 2016-04-30 MED ORDER — KETOROLAC TROMETHAMINE 60 MG/2ML IM SOLN
60.0000 mg | Freq: Once | INTRAMUSCULAR | Status: AC
  Administered 2016-04-30: 60 mg via INTRAMUSCULAR
  Filled 2016-04-30: qty 2

## 2016-04-30 MED ORDER — IBUPROFEN 800 MG PO TABS: 800.0000 mg | ORAL_TABLET | Freq: Three times a day (TID) | ORAL | 0 refills | Status: DC

## 2016-04-30 NOTE — ED Provider Notes (Signed)
Emergency Department Provider Note   I have reviewed the triage vital signs and the nursing notes.   HISTORY  Chief Complaint Chest Pain   HPI Brittany Smith is a 40 y.o. female with PMH of chest wall pain presents to the emergency department for evaluation of return of her pain at 11 AM today. The patient was at work when she had sudden onset of symptoms. She tried a breathing and massage to control her pain but this did not work. She denies pleuritic pain. No radiation of pain to her back or neck. She reports worsening pain with turning her head to the right or pressing in the center of her chest. No increased discomfort with exertion. Patient denies any associated cough, fever, vomiting, nausea, diarrhea. No abdominal pain. She reports there is no possibility she could be pregnant.   History reviewed. No pertinent past medical history.  There are no active problems to display for this patient.   Past Surgical History:  Procedure Laterality Date  . ANTERIOR CRUCIATE LIGAMENT REPAIR    . KNEE ARTHROSCOPY WITH ANTERIOR CRUCIATE LIGAMENT (ACL) REPAIR    . TUBAL LIGATION      Current Outpatient Rx  . Order #: [id number] Class: Print  . Order #: [id number] Class: Print  . Order #: [id number] Class: Historical Med    Allergies Ceftriaxone and Tramadol  No family history on file.  Social History Social History  Substance Use Topics  . Smoking status: Former Smoker    Quit date: 10/05/2012  . Smokeless tobacco: Not on file  . Alcohol use Yes     Comment: occasionally- weekends    Review of Systems  Constitutional: No fever/chills Eyes: No visual changes. ENT: No sore throat. Cardiovascular: Positive chest pain. Respiratory: Denies shortness of breath. Gastrointestinal: No abdominal pain.  No nausea, no vomiting.  No diarrhea.  No constipation. Genitourinary: Negative for dysuria. Musculoskeletal: Negative for back pain. Skin: Negative for rash. Neurological:  Negative for headaches, focal weakness or numbness.  10-point ROS otherwise negative.  ____________________________________________   PHYSICAL EXAM:  VITAL SIGNS: ED Triage Vitals  Enc Vitals Group     BP 04/30/16 1908 131/78     Pulse Rate 04/30/16 1908 60     Resp 04/30/16 1908 20     Temp 04/30/16 1908 98.2 F (36.8 C)     Temp Source 04/30/16 1908 Oral     SpO2 04/30/16 1908 100 %     Pain Score 04/30/16 1906 8   Constitutional: Alert and oriented. Well appearing and in no acute distress. Eyes: Conjunctivae are normal. PERRL. EOMI. Head: Atraumatic. Nose: No congestion/rhinnorhea. Mouth/Throat: Mucous membranes are moist.  Oropharynx non-erythematous. Neck: No stridor.  Cardiovascular: Normal rate, regular rhythm. Good peripheral circulation. Grossly normal heart sounds. Tenderness to palpation of the left chest wall.  Respiratory: Normal respiratory effort.  No retractions. Lungs CTAB. Gastrointestinal: Soft and nontender. No distention.  Musculoskeletal: No lower extremity tenderness nor edema. No gross deformities of extremities. Neurologic:  Normal speech and language. No gross focal neurologic deficits are appreciated.  Skin:  Skin is warm, dry and intact. No rash noted. Psychiatric: Mood and affect are normal. Speech and behavior are normal.  ____________________________________________   LABS (all labs ordered are listed, but only abnormal results are displayed)  Labs Reviewed  [place], ED   ____________________________________________  EKG  Reviewed in MUSE.  ____________________________________________  RADIOLOGY  Dg Chest 2 View  Result Date: 04/30/2016 CLINICAL DATA:  Mid chest pain and chest pressure. Shortness of breath. EXAM: CHEST  2 VIEW COMPARISON:  None. FINDINGS: The heart size and mediastinal contours are within normal limits. Both lungs are clear. The visualized skeletal structures are unremarkable.  IMPRESSION: No active cardiopulmonary disease. Electronically Signed   By: Fidela Salisbury M.D.   On: 04/30/2016 19:29   ____________________________________________   PROCEDURES  Procedure(s) performed:   Procedures  None ____________________________________________   INITIAL IMPRESSION / ASSESSMENT AND PLAN / ED COURSE  Pertinent labs & imaging results that were available during my care of the patient were reviewed by me and considered in my medical decision making (see chart for details).  Patient resents to the emergency department for evaluation of chest pain. The patient has had similar pain in the past that was thought to be chest wall discomfort. The patient has very few risk factors for acute coronary syndrome. Very low risk for DVT/PE. HEART score 0. With onset of pain at 11 AM plan for single troponin, IM toradol, and discharge home with PCP follow up.   09:36 PM Patient's symptoms improved after IM Toradol. We'll discharge home with resources regarding establishing care at the primary care physician. Discussed using Tylenol, Motrin, lidocaine ointment or patch (OTC). We'll discharge at this time. Answered patient and family questions prior to discharge. ____________________________________________  FINAL CLINICAL IMPRESSION(S) / ED DIAGNOSES  Final diagnoses:  Atypical chest pain     MEDICATIONS GIVEN DURING THIS VISIT:  Medications  ketorolac (TORADOL) injection 60 mg (60 mg Intramuscular Given 04/30/16 2109)     NEW OUTPATIENT MEDICATIONS STARTED DURING THIS VISIT:  New Prescriptions   IBUPROFEN (ADVIL,MOTRIN) 800 MG TABLET    Take 1 tablet (800 mg total) by mouth 3 (three) times daily.      Note:  This document was prepared using Dragon voice recognition software and may include unintentional dictation errors.  Nanda Quinton, MD Emergency Medicine   Margette Fast, MD 04/30/16 [phone]

## 2016-04-30 NOTE — Discharge Instructions (Signed)
You have been seen in the Emergency Department (ED) today for chest pain.  As we have discussed today?s test results are normal, and we believe your pain is due to pain/strain and/or inflammation of the muscles and/or cartilage of your chest wall.  We recommend you take ibuprofen 600 mg three times a day with meals for the next 5 days (unless you have been told previously not to take ibuprofen or NSAIDs in general).  You may also take Tylenol according to the label instructions.  Read through the included information for additional treatment recommendations and precautions.  Continue to take your regular medications.   Return to the Emergency Department (ED) if you experience any further chest pain/pressure/tightness, difficulty breathing, or sudden sweating, or other symptoms that concern you.   RESOURCE GUIDE  Chronic Pain Problems: Contact [facility]  [phone] Patients need to be referred by their primary care doctor.  Insufficient Money for Medicine: Contact [employer]:  call [phone]  No Primary Care Doctor: Call [facility]  [phone] - can help you locate a primary care doctor that  accepts your insurance, provides certain services, etc. Physician Referral Service- [phone]  Agencies that provide inexpensive medical care: [facility]  [facility]  [phone] [facility]  [phone] [facility]  [phone] [facility]  [phone] [facility]  [phone]  [place] Providers: [facility]- [address], [address]  [phone], Mon-Fri 9am-7pm, Sat 9am-1pm [place], [address] [place]  [place], [address] [place]  [place]y [place]- [address]  [place], [address], [phone]  Only accepts [place]  [place] patients after they have their name  applied to their card  Self Pay (no insurance) in [facility]: Sickle Cell Patients - [facility]  [place], [facility]- [facility] [place]- [phone] [place] [address], [facility]- see information above (Speak to [employer] if you do not have insurance)       -  [facility]- [place],  [place], [place]  Dr Vista Lawman-  [address], [place], [place], [place]       -  [facility] [place] [address] [phone]       -  [facility]- [address], [place], also [address] [phone]       -     [facility]- [address], [place], 1st & 3rd Saturday         every month, 10am-1pm  -     [place]   [place] [address], [place].   Phone:  [phone], Fax:  [phone]. Hours of Operation:  9 am - 6 pm, M-F.  -     [facility]   [address], [place]   Phone: [phone], Fax: [phone]. Hours of Operation:  8:30 am - 5:30  pm, M-F.    Dental Assistance If unable to pay or uninsured, contact:  [facility]. to become qualified for the adult dental clinic.  Patients with Medicaid: [facility] [phone] [address], [place] [address] [phone]  If unable to pay, or uninsured, contact [facility] [phone] in [place], [place] in [facility]) to become qualified for the adult dental clinic  [facility] [address] [place], [place] [address] [phone] [email or web address]  Other [place]: [facility] [place], [place], [place], [address], [place], [address], 2nd and  4th Thursday of the month at 6:30am.  10 clients each day by appointment, can sometimes see walk-in patients if someone does not show for an appointment. [facility]- [address] Hillard Danker [place], [place], [address], [place], [place], [place], [address], [place] Department- [phone] [facility] [phone]

## 2016-04-30 NOTE — ED Triage Notes (Signed)
Per pt, c/o Cp since this morning, pain feels better with palpation. Pt had multiple episodes in the past and has been diagnosed with CHest wall pain. Pt ambulatory.

## 2016-05-04 ENCOUNTER — Emergency Department (HOSPITAL_COMMUNITY): Payer: No Typology Code available for payment source

## 2016-05-04 ENCOUNTER — Encounter (HOSPITAL_COMMUNITY): Payer: Self-pay | Admitting: Emergency Medicine

## 2016-05-04 ENCOUNTER — Emergency Department (HOSPITAL_COMMUNITY)
Admission: EM | Admit: 2016-05-04 | Discharge: 2016-05-04 | Disposition: A | Discharge status: Home / Self Care | Payer: No Typology Code available for payment source | Attending: Emergency Medicine | Admitting: Emergency Medicine

## 2016-05-04 DIAGNOSIS — Y939 Activity, unspecified: Secondary | ICD-10-CM | POA: Insufficient documentation

## 2016-05-04 DIAGNOSIS — Y92410 Unspecified street and highway as the place of occurrence of the external cause: Secondary | ICD-10-CM | POA: Insufficient documentation

## 2016-05-04 DIAGNOSIS — M25462 Effusion, left knee: Secondary | ICD-10-CM | POA: Insufficient documentation

## 2016-05-04 DIAGNOSIS — S161XXA Strain of muscle, fascia and tendon at neck level, initial encounter: Secondary | ICD-10-CM | POA: Diagnosis not present

## 2016-05-04 DIAGNOSIS — Y999 Unspecified external cause status: Secondary | ICD-10-CM | POA: Diagnosis not present

## 2016-05-04 DIAGNOSIS — S199XXA Unspecified injury of neck, initial encounter: Secondary | ICD-10-CM | POA: Diagnosis present

## 2016-05-04 DIAGNOSIS — Z87891 Personal history of nicotine dependence: Secondary | ICD-10-CM | POA: Insufficient documentation

## 2016-05-04 DIAGNOSIS — R51 Headache: Secondary | ICD-10-CM | POA: Insufficient documentation

## 2016-05-04 MED ORDER — IBUPROFEN 200 MG PO TABS
600.0000 mg | ORAL_TABLET | Freq: Once | ORAL | Status: AC
  Administered 2016-05-04: 600 mg via ORAL
  Filled 2016-05-04: qty 3

## 2016-05-04 MED ORDER — IBUPROFEN 600 MG PO TABS: 600.0000 mg | ORAL_TABLET | Freq: Four times a day (QID) | ORAL | 0 refills | Status: DC | PRN

## 2016-05-04 MED ORDER — METHOCARBAMOL 500 MG PO TABS: 500.0000 mg | ORAL_TABLET | Freq: Two times a day (BID) | ORAL | 0 refills | Status: DC
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
## 2016-05-04 NOTE — ED Notes (Signed)
MD at bedside. 

## 2016-05-04 NOTE — ED Notes (Signed)
Disregard Discharge condition; incorrect patient
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
## 2016-05-04 NOTE — ED Notes (Signed)
Patient transported to CT 

## 2016-10-02 ENCOUNTER — Emergency Department (HOSPITAL_COMMUNITY): Payer: Self-pay

## 2016-10-02 ENCOUNTER — Emergency Department (HOSPITAL_COMMUNITY)
Admission: EM | Admit: 2016-10-02 | Discharge: 2016-10-02 | Disposition: A | Discharge status: Home / Self Care | Payer: Self-pay | Attending: Emergency Medicine | Admitting: Emergency Medicine

## 2016-10-02 ENCOUNTER — Encounter (HOSPITAL_COMMUNITY): Payer: Self-pay | Admitting: Emergency Medicine

## 2016-10-02 DIAGNOSIS — Z87891 Personal history of nicotine dependence: Secondary | ICD-10-CM | POA: Insufficient documentation

## 2016-10-02 DIAGNOSIS — Z79899 Other long term (current) drug therapy: Secondary | ICD-10-CM | POA: Insufficient documentation

## 2016-10-02 DIAGNOSIS — N83201 Unspecified ovarian cyst, right side: Secondary | ICD-10-CM | POA: Insufficient documentation

## 2016-10-02 DIAGNOSIS — R1031 Right lower quadrant pain: Secondary | ICD-10-CM

## 2016-10-02 LAB — COMPREHENSIVE METABOLIC PANEL
ALBUMIN: 4.8 g/dL (ref 3.5–5.0)
ALK PHOS: 47 U/L (ref 38–126)
ALT: 9 U/L — AB (ref 14–54)
ANION GAP: 8 (ref 5–15)
AST: 17 U/L (ref 15–41)
BUN: 9 mg/dL (ref 6–20)
CALCIUM: 9.4 mg/dL (ref 8.9–10.3)
CO2: 26 mmol/L (ref 22–32)
Chloride: 103 mmol/L (ref 101–111)
Creatinine, Ser: 0.67 mg/dL (ref 0.44–1.00)
GFR calc Af Amer: >60 mL/min (ref >60)
GFR calc non Af Amer: >60 mL/min (ref >60)
GLUCOSE: 104 mg/dL — AB (ref 65–99)
Potassium: 4 mmol/L (ref 3.5–5.1)
SODIUM: 137 mmol/L (ref 135–145)
Total Bilirubin: 0.5 mg/dL (ref 0.3–1.2)
Total Protein: 7.3 g/dL (ref 6.5–8.1)

## 2016-10-02 LAB — URINALYSIS, ROUTINE W REFLEX MICROSCOPIC
BILIRUBIN URINE: NEGATIVE
Glucose, UA: NEGATIVE mg/dL
HGB URINE DIPSTICK: NEGATIVE
KETONES UR: NEGATIVE mg/dL
Leukocytes, UA: NEGATIVE
Nitrite: NEGATIVE
Protein, ur: NEGATIVE mg/dL
SPECIFIC GRAVITY, URINE: 1.004 — AB (ref 1.005–1.030)
pH: 8 (ref 5.0–8.0)

## 2016-10-02 LAB — CBC
HCT: 41.3 % (ref 36.0–46.0)
HEMOGLOBIN: 14.2 g/dL (ref 12.0–15.0)
MCH: 27.8 pg (ref 26.0–34.0)
MCHC: 34.4 g/dL (ref 30.0–36.0)
MCV: 81 fL (ref 78.0–100.0)
Platelets: 281 10*3/uL (ref 150–400)
RBC: 5.1 MIL/uL (ref 3.87–5.11)
RDW: 14.2 % (ref 11.5–15.5)
WBC: 11.2 10*3/uL — ABNORMAL HIGH (ref 4.0–10.5)

## 2016-10-02 LAB — LIPASE, BLOOD: LIPASE: 22 U/L (ref 11–51)

## 2016-10-02 LAB — I-STAT BETA HCG BLOOD, ED (MC, WL, AP ONLY)

## 2016-10-02 MED ORDER — MORPHINE SULFATE (PF) 4 MG/ML IV SOLN
4.0000 mg | Freq: Once | INTRAVENOUS | Status: AC
  Administered 2016-10-02: 4 mg via INTRAVENOUS
  Filled 2016-10-02 (×2): qty 1

## 2016-10-02 MED ORDER — KETOROLAC TROMETHAMINE 30 MG/ML IJ SOLN
30.0000 mg | Freq: Once | INTRAMUSCULAR | Status: AC
  Administered 2016-10-02: 30 mg via INTRAVENOUS
  Filled 2016-10-02 (×2): qty 1

## 2016-10-02 MED ORDER — IOPAMIDOL (ISOVUE-300) INJECTION 61%
INTRAVENOUS | Status: AC
  Administered 2016-10-02: 100 mL
  Filled 2016-10-02: qty 100

## 2016-10-02 MED ORDER — SODIUM CHLORIDE 0.9 % IV BOLUS (SEPSIS): 1000.0000 mL | Freq: Once | INTRAVENOUS | Status: DC

## 2016-10-02 MED ORDER — HYDROCODONE-ACETAMINOPHEN 5-325 MG PO TABS: 1.0000 | ORAL_TABLET | Freq: Four times a day (QID) | ORAL | 0 refills | Status: DC | PRN

## 2016-10-02 MED ORDER — PROMETHAZINE HCL 25 MG PO TABS: 25.0000 mg | ORAL_TABLET | Freq: Three times a day (TID) | ORAL | 0 refills | Status: DC | PRN

## 2016-10-02 NOTE — ED Provider Notes (Signed)
[facility] DEPT Provider Note   CSN: [id number] Arrival date & time: 10/02/16  1324     History   Chief Complaint Chief Complaint  Patient presents with  . Abdominal Pain  . Urinary Frequency    HPI Brittany Smith is a 40 y.o. female.  HPI Patient presents to the emergency department with lower abdominal pain that started yesterday.  The patient states that it has been fairly constant over that time.  She states that nothing seems make her condition better.  She did take some ibuprofen, which did slightly improve her symptoms.  Nothing seems make the condition worse.  She states other than movements. The patient denies chest pain, shortness of breath, headache,blurred vision, neck pain, fever, cough, weakness, numbness, dizziness, anorexia, edema,vomiting, diarrhea, rash, back pain, dysuria, hematemesis, bloody stool, near syncope, or syncope. History reviewed. No pertinent past medical history.  There are no active problems to display for this patient.   Past Surgical History:  Procedure Laterality Date  . ANTERIOR CRUCIATE LIGAMENT REPAIR    . KNEE ARTHROSCOPY WITH ANTERIOR CRUCIATE LIGAMENT (ACL) REPAIR    . TUBAL LIGATION      OB History    No data available       Home Medications    Prior to Admission medications   Medication Sig Start Date End Date Taking? Authorizing Provider  ibuprofen (ADVIL,MOTRIN) 200 MG tablet Take 400 mg by mouth every 6 (six) hours as needed.   Yes Historical Provider, MD  ibuprofen (ADVIL,MOTRIN) 600 MG tablet Take 1 tablet (600 mg total) by mouth every 6 (six) hours as needed. Patient not taking: Reported on 10/02/2016 05/04/16   Varney Biles, MD  methocarbamol (ROBAXIN) 500 MG tablet Take 1 tablet (500 mg total) by mouth 2 (two) times daily. Patient not taking: Reported on 10/02/2016 05/04/16   Varney Biles, MD  ondansetron (ZOFRAN ODT) 8 MG disintegrating tablet Take 1 tablet (8 mg total) by mouth every 8 (eight) hours as  needed for nausea or vomiting. Patient not taking: Reported on 10/02/2016 04/07/16   Jeannett Senior, PA-C    Family History History reviewed. No pertinent family history.  Social History Social History  Substance Use Topics  . Smoking status: Former Smoker    Quit date: 10/05/2012  . Smokeless tobacco: Former Systems developer  . Alcohol use Yes     Comment: occasionally- weekends     Allergies   Ceftriaxone and Tramadol   Review of Systems Review of Systems All other systems negative except as documented in the HPI. All pertinent positives and negatives as reviewed in the HPI.  Physical Exam Updated Vital Signs BP 126/67 (BP Location: Right Arm)   Pulse 66   Temp 98.1 F (36.7 C) (Oral)   Resp 14   SpO2 100%   Physical Exam  Constitutional: She is oriented to person, place, and time. She appears well-developed and well-nourished. No distress.  HENT:  Head: Normocephalic and atraumatic.  Mouth/Throat: Oropharynx is clear and moist.  Eyes: Pupils are equal, round, and reactive to light.  Neck: Normal range of motion. Neck supple.  Cardiovascular: Normal rate, regular rhythm and normal heart sounds.  Exam reveals no gallop and no friction rub.   No murmur heard. Pulmonary/Chest: Effort normal and breath sounds normal. No respiratory distress. She has no wheezes.  Abdominal: Soft. Bowel sounds are normal. She exhibits no distension and no mass. There is tenderness. There is no rebound and no guarding.  Neurological: She is alert and oriented  to person, place, and time. She exhibits normal muscle tone. Coordination normal.  Skin: Skin is warm and dry. No rash noted. No erythema.  Psychiatric: She has a normal mood and affect. Her behavior is normal.  Nursing note and vitals reviewed.    ED Treatments / Results  Labs (all labs ordered are listed, but only abnormal results are displayed) Labs Reviewed  COMPREHENSIVE METABOLIC PANEL - Abnormal; Notable for the following:        Result Value   Glucose, Bld 104 (*)    ALT 9 (*)    All other components within normal limits  CBC - Abnormal; Notable for the following:    WBC 11.2 (*)    All other components within normal limits  URINALYSIS, ROUTINE W REFLEX MICROSCOPIC - Abnormal; Notable for the following:    Color, Urine STRAW (*)    Specific Gravity, Urine 1.004 (*)    All other components within normal limits  LIPASE, BLOOD  I-STAT BETA HCG BLOOD, ED ([facility], [facility], [facility] ONLY)    EKG  EKG Interpretation None       Radiology Ct Abdomen Pelvis W Contrast  Result Date: 10/02/2016 CLINICAL DATA:  Right lower quadrant abdomen pain EXAM: CT ABDOMEN AND PELVIS WITH CONTRAST TECHNIQUE: Multidetector CT imaging of the abdomen and pelvis was performed using the standard protocol following bolus administration of intravenous contrast. CONTRAST:  167mL ISOVUE-300 IOPAMIDOL (ISOVUE-300) INJECTION 61% COMPARISON:  December 03, 2012 FINDINGS: Lower chest: No acute abnormality. Hepatobiliary: No focal liver abnormality is seen. No gallstones, gallbladder wall thickening, or biliary dilatation. Pancreas: Unremarkable. No pancreatic ductal dilatation or surrounding inflammatory changes. Spleen: Normal in size without focal abnormality. Adrenals/Urinary Tract: Adrenal glands are unremarkable. Kidneys are normal, without renal calculi, focal lesion, or hydronephrosis. Bladder is unremarkable. Stomach/Bowel: Stomach is within normal limits. The appendix is not seen but no inflammation is noted around the cecum. No evidence of bowel wall thickening, distention, or inflammatory changes. Vascular/Lymphatic: No significant vascular findings are present. No enlarged abdominal or pelvic lymph nodes. Reproductive: Uterus is normal. There is a 2.7 cm cyst in the right ovary. Small amount of free fluid is identified in the pelvis. Other: No abdominal wall hernia or abnormality. No abdominopelvic ascites. Musculoskeletal: No acute or significant osseous  findings. IMPRESSION: No acute abnormality identified in the abdomen. The appendix is not seen but no inflammation is noted around cecum. 2.7 cm cyst in the right ovary with small amount free fluid noted in the pelvis, likely physiologic. Electronically Signed   By: Abelardo Diesel M.D.   On: 10/02/2016 16:55    Procedures Procedures (including critical care time)  Medications Ordered in ED Medications  sodium chloride 0.9 % bolus 1,000 mL (not administered)  ketorolac (TORADOL) 30 MG/ML injection 30 mg (not administered)  morphine 4 MG/ML injection 4 mg (not administered)  iopamidol (ISOVUE-300) 61 % injection (100 mLs  Contrast Given 10/02/16 1630)     Initial Impression / Assessment and Plan / ED Course  I have reviewed the triage vital signs and the nursing notes.  Pertinent labs & imaging results that were available during my care of the patient were reviewed by me and considered in my medical decision making (see chart for details).  Clinical Course     I did advise the patient of the results.  I did advise her that this could be an evolving process that could still including her appendix.  The patient declines vaginal exam at this time.  The  patient is given return precautions which include but not limited to worsening pain, fever, vomiting, or any other changes in her condition.  The patient agrees the plan and all questions were answered  Final Clinical Impressions(s) / ED Diagnoses   Final diagnoses:  None    New Prescriptions New Prescriptions   No medications on file     Dalia Heading, PA-C 10/02/16 [place], MD 10/03/16 1931

## 2016-10-02 NOTE — ED Triage Notes (Signed)
Pt c/o RLQ abdominal pain, nausea, urinary frequency onset yesterday. No vaginal discharge, hematuria, fevers, chills.

## 2016-10-02 NOTE — Discharge Instructions (Signed)
Return here as needed for any worsening in your condition.

## 2016-12-28 ENCOUNTER — Encounter (HOSPITAL_COMMUNITY): Payer: Self-pay | Admitting: *Deleted

## 2016-12-28 ENCOUNTER — Emergency Department (HOSPITAL_COMMUNITY)
Admission: EM | Admit: 2016-12-28 | Discharge: 2016-12-28 | Disposition: A | Discharge status: Home / Self Care | Payer: Commercial Managed Care - PPO | Attending: Emergency Medicine | Admitting: Emergency Medicine

## 2016-12-28 DIAGNOSIS — R202 Paresthesia of skin: Secondary | ICD-10-CM | POA: Diagnosis present

## 2016-12-28 DIAGNOSIS — R200 Anesthesia of skin: Secondary | ICD-10-CM | POA: Diagnosis not present

## 2016-12-28 DIAGNOSIS — Z87891 Personal history of nicotine dependence: Secondary | ICD-10-CM | POA: Insufficient documentation

## 2016-12-28 MED ORDER — METHOCARBAMOL 500 MG PO TABS: 500.0000 mg | ORAL_TABLET | Freq: Two times a day (BID) | ORAL | 0 refills | Status: DC

## 2016-12-28 NOTE — ED Notes (Signed)
ED Provider at bedside. 

## 2016-12-28 NOTE — ED Triage Notes (Signed)
Pt complains of pain, numbness and tingling to her right arm that wakes her up from sleeping. Pt has has had issue for the past year, states pain is getting worse. Pt does not have issue during the day. Pt states she still has issue when not sleeping on right arm. Pt denies injury to arm.

## 2016-12-28 NOTE — ED Provider Notes (Signed)
[facility] DEPT Provider Note   CSN: [id number] Arrival date & time: 12/28/16  1405     History   Chief Complaint Chief Complaint  Patient presents with  . Arm Pain    HPI Brittany Smith is a 41 y.o. female.  The history is provided by the patient. No language interpreter was used.  Arm Pain  This is a recurrent problem. The problem occurs constantly. The problem has not changed since onset.Nothing aggravates the symptoms. Nothing relieves the symptoms. She has tried nothing for the symptoms. The treatment provided no relief.  Pt complains of tingling to her right arm.  Pt reports it wakes her up at night.  Pt reports no problems during the day.  Pt reports this has been on and off.  Pt is wearing a brace.  No pain or numbness with working  History reviewed. No pertinent past medical history.  There are no active problems to display for this patient.   Past Surgical History:  Procedure Laterality Date  . ANTERIOR CRUCIATE LIGAMENT REPAIR    . KNEE ARTHROSCOPY WITH ANTERIOR CRUCIATE LIGAMENT (ACL) REPAIR    . TUBAL LIGATION      OB History    No data available       Home Medications    Prior to Admission medications   Medication Sig Start Date End Date Taking? Authorizing Provider  HYDROcodone-acetaminophen (NORCO/VICODIN) 5-325 MG tablet Take 1 tablet by mouth every 6 (six) hours as needed for moderate pain. 10/02/16   Dalia Heading, PA-C  ibuprofen (ADVIL,MOTRIN) 200 MG tablet Take 400 mg by mouth every 6 (six) hours as needed.    Historical Provider, MD  ibuprofen (ADVIL,MOTRIN) 600 MG tablet Take 1 tablet (600 mg total) by mouth every 6 (six) hours as needed. Patient not taking: Reported on 10/02/2016 05/04/16   Varney Biles, MD  methocarbamol (ROBAXIN) 500 MG tablet Take 1 tablet (500 mg total) by mouth 2 (two) times daily. Patient not taking: Reported on 10/02/2016 05/04/16   Varney Biles, MD  ondansetron (ZOFRAN ODT) 8 MG disintegrating tablet  Take 1 tablet (8 mg total) by mouth every 8 (eight) hours as needed for nausea or vomiting. Patient not taking: Reported on 10/02/2016 04/07/16   Jeannett Senior, PA-C  promethazine (PHENERGAN) 25 MG tablet Take 1 tablet (25 mg total) by mouth every 8 (eight) hours as needed for nausea or vomiting. 10/02/16   Dalia Heading, PA-C    Family History No family history on file.  Social History Social History  Substance Use Topics  . Smoking status: Former Smoker    Quit date: 10/05/2012  . Smokeless tobacco: Former Systems developer  . Alcohol use Yes     Comment: occasionally- weekends     Allergies   Ceftriaxone and Tramadol   Review of Systems Review of Systems  All other systems reviewed and are negative.    Physical Exam Updated Vital Signs BP 111/70 (BP Location: Left Arm)   Pulse 64   Temp 98.1 F (36.7 C) (Oral)   Resp 18   LMP 12/05/2016   SpO2 100%   Physical Exam  Constitutional: She appears well-developed and well-nourished.  HENT:  Head: Normocephalic.  Musculoskeletal: She exhibits tenderness.  No deformity, no swelling  From  No pain  nv and ns intact   Neurological: She is alert.  Skin: Skin is warm.  Psychiatric: She has a normal mood and affect.     ED Treatments / Results  Labs (all labs ordered are  listed, but only abnormal results are displayed) Labs Reviewed - No data to display  EKG  EKG Interpretation None       Radiology No results found.  Procedures Procedures (including critical care time)  Medications Ordered in ED Medications - No data to display   Initial Impression / Assessment and Plan / ED Course  I have reviewed the triage vital signs and the nursing notes.  Pertinent labs & imaging results that were available during my care of the patient were reviewed by me and considered in my medical decision making (see chart for details).       Final Clinical Impressions(s) / ED Diagnoses   Final diagnoses:  Numbness and  tingling of right arm    New Prescriptions Discharge Medication List as of 12/28/2016  5:22 PM     Meds ordered this encounter  Medications  . methocarbamol (ROBAXIN) 500 MG tablet    Sig: Take 1 tablet (500 mg total) by mouth 2 (two) times daily.    Dispense:  20 tablet    Refill:  0    Order Specific Question:   Supervising Provider    Answer:   Lajean Saver [[id number]]  Pt advised to follow up with Neurologist  An After Visit Summary was printed and given to the patient.   Hollace Kinnier Applewold, PA-C 12/28/16 Thornhill Liu, MD 12/29/16 [phone]

## 2017-02-19 ENCOUNTER — Emergency Department (HOSPITAL_COMMUNITY)
Admission: EM | Admit: 2017-02-19 | Discharge: 2017-02-19 | Disposition: A | Discharge status: Home / Self Care | Payer: Commercial Managed Care - PPO | Attending: Emergency Medicine | Admitting: Emergency Medicine

## 2017-02-19 ENCOUNTER — Encounter (HOSPITAL_COMMUNITY): Payer: Self-pay | Admitting: Emergency Medicine

## 2017-02-19 DIAGNOSIS — Z87891 Personal history of nicotine dependence: Secondary | ICD-10-CM | POA: Insufficient documentation

## 2017-02-19 DIAGNOSIS — R1030 Lower abdominal pain, unspecified: Secondary | ICD-10-CM | POA: Diagnosis present

## 2017-02-19 LAB — CBC
HEMATOCRIT: 36.9 % (ref 36.0–46.0)
HEMOGLOBIN: 12.6 g/dL (ref 12.0–15.0)
MCH: 28.2 pg (ref 26.0–34.0)
MCHC: 34.1 g/dL (ref 30.0–36.0)
MCV: 82.6 fL (ref 78.0–100.0)
Platelets: 282 10*3/uL (ref 150–400)
RBC: 4.47 MIL/uL (ref 3.87–5.11)
RDW: 14.2 % (ref 11.5–15.5)
WBC: 5.6 10*3/uL (ref 4.0–10.5)

## 2017-02-19 LAB — COMPREHENSIVE METABOLIC PANEL
ALK PHOS: 43 U/L (ref 38–126)
ALT: 9 U/L — ABNORMAL LOW (ref 14–54)
ANION GAP: 3 — AB (ref 5–15)
AST: 16 U/L (ref 15–41)
Albumin: 4.2 g/dL (ref 3.5–5.0)
BILIRUBIN TOTAL: 0.4 mg/dL (ref 0.3–1.2)
BUN: 13 mg/dL (ref 6–20)
CALCIUM: 8.6 mg/dL — AB (ref 8.9–10.3)
CO2: 25 mmol/L (ref 22–32)
Chloride: 107 mmol/L (ref 101–111)
Creatinine, Ser: 0.68 mg/dL (ref 0.44–1.00)
GFR calc non Af Amer: >60 mL/min (ref >60)
Glucose, Bld: 98 mg/dL (ref 65–99)
POTASSIUM: 4.2 mmol/L (ref 3.5–5.1)
SODIUM: 135 mmol/L (ref 135–145)
Total Protein: 6.5 g/dL (ref 6.5–8.1)

## 2017-02-19 LAB — I-STAT BETA HCG BLOOD, ED (MC, WL, AP ONLY): I-stat hCG, quantitative: <5 m[IU]/mL (ref <5)

## 2017-02-19 LAB — LIPASE, BLOOD: Lipase: 21 U/L (ref 11–51)

## 2017-02-19 MED ORDER — MORPHINE SULFATE (PF) 4 MG/ML IV SOLN
4.0000 mg | Freq: Once | INTRAVENOUS | Status: AC
  Administered 2017-02-19: 4 mg via INTRAVENOUS
  Filled 2017-02-19: qty 1

## 2017-02-19 MED ORDER — ONDANSETRON 4 MG PO TBDP: 4.0000 mg | ORAL_TABLET | Freq: Three times a day (TID) | ORAL | 0 refills | Status: DC | PRN

## 2017-02-19 MED ORDER — SODIUM CHLORIDE 0.9 % IV BOLUS (SEPSIS)
1000.0000 mL | Freq: Once | INTRAVENOUS | Status: AC
  Administered 2017-02-19: 1000 mL via INTRAVENOUS

## 2017-02-19 MED ORDER — ONDANSETRON HCL 4 MG/2ML IJ SOLN
4.0000 mg | Freq: Once | INTRAMUSCULAR | Status: AC
  Administered 2017-02-19: 4 mg via INTRAVENOUS
  Filled 2017-02-19: qty 2

## 2017-02-19 MED ORDER — KETOROLAC TROMETHAMINE 30 MG/ML IJ SOLN
30.0000 mg | Freq: Once | INTRAMUSCULAR | Status: AC
  Administered 2017-02-19: 30 mg via INTRAVENOUS
  Filled 2017-02-19: qty 1

## 2017-02-19 MED ORDER — HYDROCODONE-ACETAMINOPHEN 5-325 MG PO TABS: 1.0000 | ORAL_TABLET | ORAL | 0 refills | Status: DC | PRN

## 2017-02-19 MED ORDER — NAPROXEN 500 MG PO TABS: 500.0000 mg | ORAL_TABLET | Freq: Two times a day (BID) | ORAL | 0 refills | Status: DC

## 2017-02-19 NOTE — Discharge Instructions (Signed)
Take your medication as prescribed as needed for her symptoms. Continue drinking fluids at home to remain hydrated. I recommend following up with the women's clinic listed below neck week for follow-up evaluation regarding your abdominal pain and history of ovarian cysts. You will likely need an outpatient ultrasound performed for further evaluation. Return to the emergency department if symptoms worsen or new onset of fever, chest pain, difficulty breathing, new/worsening abdominal pain, vomiting and unable to keep fluids down.

## 2017-02-19 NOTE — ED Triage Notes (Signed)
Patient here with complaints of lower abdominal pain described as cramping. Denies nausea/vomtiting. Reports headache also. Period currently on. Has two bags of chips in triage.

## 2017-02-19 NOTE — ED Provider Notes (Signed)
[facility] DEPT Provider Note   CSN: [id number] Arrival date & time: 02/19/17  1211     History   Chief Complaint Chief Complaint  Patient presents with  . Abdominal Pain    HPI Brittany Smith is a 41 y.o. female.  HPI  Patient is a 41 year old female with history of ovarian cysts who presents the ED with complaint of abdominal pain, onset 3 days. Patient reports starting her menstrual cycle 3 days ago with initial onset of lower abdominal cramping. She reports her symptoms were initially consistent with her typical menstrual. But notes this morning while she was at work she began having worsening waxing and waning cramping to her lower abdomen. She reports her pain was consistent with pain she had in the past related to her ovarian cysts. Patient reports taking ibuprofen at home without relief. Patient also reports having mild headaches which has improved since arrival to the ED. Denies fever, chills, chest pain, shortness of breath, nausea, vomiting, diarrhea, constipation, urinary symptoms, vaginal discharge. Endorses abdominal surgical history of tubal ligation and subsequent removal.  History reviewed. No pertinent past medical history.  There are no active problems to display for this patient.   Past Surgical History:  Procedure Laterality Date  . ANTERIOR CRUCIATE LIGAMENT REPAIR    . KNEE ARTHROSCOPY WITH ANTERIOR CRUCIATE LIGAMENT (ACL) REPAIR    . TUBAL LIGATION      OB History    No data available       Home Medications    Prior to Admission medications   Medication Sig Start Date End Date Taking? Authorizing Provider  ibuprofen (ADVIL,MOTRIN) 200 MG tablet Take 400 mg by mouth every 6 (six) hours as needed for mild pain or moderate pain.    Yes [provider]  HYDROcodone-acetaminophen (NORCO/VICODIN) 5-325 MG tablet Take 1 tablet by mouth every 4 (four) hours as needed. 02/19/17   Nona Dell, PA-C  methocarbamol (ROBAXIN) 500  MG tablet Take 1 tablet (500 mg total) by mouth 2 (two) times daily. Patient not taking: Reported on 02/19/2017 12/28/16   Fransico Meadow, PA-C  naproxen (NAPROSYN) 500 MG tablet Take 1 tablet (500 mg total) by mouth 2 (two) times daily. 02/19/17   Nona Dell, PA-C  ondansetron (ZOFRAN ODT) 4 MG disintegrating tablet Take 1 tablet (4 mg total) by mouth every 8 (eight) hours as needed for nausea or vomiting. 02/19/17   Nona Dell, PA-C  promethazine (PHENERGAN) 25 MG tablet Take 1 tablet (25 mg total) by mouth every 8 (eight) hours as needed for nausea or vomiting. Patient not taking: Reported on 02/19/2017 10/02/16   Dalia Heading, PA-C    Family History No family history on file.  Social History Social History  Substance Use Topics  . Smoking status: Former Smoker    Quit date: 10/05/2012  . Smokeless tobacco: Former Systems developer  . Alcohol use Yes     Comment: occasionally- weekends     Allergies   Ceftriaxone and Tramadol   Review of Systems Review of Systems  Gastrointestinal: Positive for abdominal pain.  Genitourinary: Positive for vaginal bleeding.  All other systems reviewed and are negative.    Physical Exam Updated Vital Signs BP (!) 96/54   Pulse 67   Temp 98.1 F (36.7 C) (Oral)   Resp 16   Ht 5' 10.5" (1.791 m)   Wt 155 lb (70.3 kg)   SpO2 95%   BMI 21.93 kg/m   Physical Exam  Constitutional: She  is oriented to person, place, and time. She appears well-developed and well-nourished. No distress.  HENT:  Head: Normocephalic and atraumatic.  Mouth/Throat: Oropharynx is clear and moist. No oropharyngeal exudate.  Eyes: Conjunctivae and EOM are normal. Right eye exhibits no discharge. Left eye exhibits no discharge. No scleral icterus.  Neck: Normal range of motion. Neck supple.  Cardiovascular: Normal rate, regular rhythm, normal heart sounds and intact distal pulses.   Pulmonary/Chest: Effort normal and breath sounds normal. No  respiratory distress. She has no wheezes. She has no rales. She exhibits no tenderness.  Abdominal: Soft. Normal appearance and bowel sounds are normal. She exhibits no distension and no mass. There is tenderness in the suprapubic area. There is no rigidity, no rebound, no guarding and no CVA tenderness. No hernia.  Musculoskeletal: Normal range of motion. She exhibits no edema.  Neurological: She is alert and oriented to person, place, and time.  Skin: Skin is warm and dry. She is not diaphoretic.  Nursing note and vitals reviewed.  Pelvic exam: normal external genitalia, vulva, vagina, cervix, uterus and adnexa, VULVA: normal appearing vulva with no masses, tenderness or lesions, VAGINA: normal appearing vagina with normal color and discharge, no lesions, vaginal discharge - bloody and scant, CERVIX: normal appearing cervix without discharge or lesions, UTERUS: uterus is normal size, shape, consistency and nontender, ADNEXA: normal adnexa in size, nontender and no masses, exam chaperoned by female nurse.   ED Treatments / Results  Labs (all labs ordered are listed, but only abnormal results are displayed) Labs Reviewed  COMPREHENSIVE METABOLIC PANEL - Abnormal; Notable for the following:       Result Value   Calcium 8.6 (*)    ALT 9 (*)    Anion gap 3 (*)    All other components within normal limits  LIPASE, BLOOD  CBC  I-STAT BETA HCG BLOOD, ED ([facility], [facility], [facility] ONLY)    EKG  EKG Interpretation None       Radiology No results found.  Procedures Procedures (including critical care time)  Medications Ordered in ED Medications  sodium chloride 0.9 % bolus 1,000 mL (0 mLs Intravenous Stopped 02/19/17 1426)  ketorolac (TORADOL) 30 MG/ML injection 30 mg (30 mg Intravenous Given 02/19/17 1348)  morphine 4 MG/ML injection 4 mg (4 mg Intravenous Given 02/19/17 1427)  ondansetron (ZOFRAN) injection 4 mg (4 mg Intravenous Given 02/19/17 1427)     Initial Impression / Assessment and Plan /  ED Course  I have reviewed the triage vital signs and the nursing notes.  Pertinent labs & imaging results that were available during my care of the patient were reviewed by me and considered in my medical decision making (see chart for details).    Patient presents with lower abdominal cramping that started 3 days ago with initiation of her menstrual cycle but worsened this morning. Reports pain feels similar to when she had an ovarian cyst previously. Denies fever. VSS. Exam revealed mild tenderness over suprapubic region, no peritoneal signs. Pelvic exam revealed small amount of blood in vaginal vault, no CMT or adnexal tenderness. Remaining exam unremarkable. Patient given IV fluids and pain meds. Pregnancy negative. Labs unremarkable. Suspect patient's symptoms are likely related to her history of ovarian cysts. Due to patient without adnexal tenderness on exam concerning for ovarian torsion, PID or TOA; I feel pt is appropriate to be d/c home once pain controlled with outpatient GYN follow up for follow up evaluation and outpatient [place]. No indication of appendicitis, bowel obstruction,  bowel perforation, cholecystitis, diverticulitis, PID or ectopic pregnancy. Suspect sxs likely related to menstrual cycle vs ovarian cyst. On reevaluation patient reports improvement of symptoms. Discussed results and plan for discharge and outpatient follow up with patient. Discussed return precautions.  [place] Controlled Substance reporting System queried, pt with one rx filled on 10/03/16 of 5-325 Vicodin #15.     Final Clinical Impressions(s) / ED Diagnoses   Final diagnoses:  Lower abdominal pain    New Prescriptions New Prescriptions   HYDROCODONE-ACETAMINOPHEN (NORCO/VICODIN) 5-325 MG TABLET    Take 1 tablet by mouth every 4 (four) hours as needed.   NAPROXEN (NAPROSYN) 500 MG TABLET    Take 1 tablet (500 mg total) by mouth 2 (two) times daily.   ONDANSETRON (ZOFRAN ODT) 4 MG DISINTEGRATING  TABLET    Take 1 tablet (4 mg total) by mouth every 8 (eight) hours as needed for nausea or vomiting.     Nona Dell, PA-C 02/19/17 1521    Carmin Muskrat, MD 02/19/17 1606

## 2017-02-19 NOTE — ED Notes (Signed)
ED Provider at bedside. 

## 2017-02-20 ENCOUNTER — Telehealth: Payer: Self-pay | Admitting: Family Medicine

## 2017-09-27 ENCOUNTER — Emergency Department (HOSPITAL_COMMUNITY): Payer: Commercial Managed Care - PPO

## 2017-09-27 ENCOUNTER — Encounter (HOSPITAL_COMMUNITY): Payer: Self-pay | Admitting: Emergency Medicine

## 2017-09-27 ENCOUNTER — Emergency Department (HOSPITAL_COMMUNITY)
Admission: EM | Admit: 2017-09-27 | Discharge: 2017-09-27 | Disposition: A | Discharge status: Home / Self Care | Payer: Commercial Managed Care - PPO | Attending: Emergency Medicine | Admitting: Emergency Medicine

## 2017-09-27 DIAGNOSIS — R1031 Right lower quadrant pain: Secondary | ICD-10-CM

## 2017-09-27 DIAGNOSIS — Z202 Contact with and (suspected) exposure to infections with a predominantly sexual mode of transmission: Secondary | ICD-10-CM | POA: Diagnosis not present

## 2017-09-27 DIAGNOSIS — N760 Acute vaginitis: Secondary | ICD-10-CM | POA: Diagnosis not present

## 2017-09-27 DIAGNOSIS — Z87891 Personal history of nicotine dependence: Secondary | ICD-10-CM | POA: Insufficient documentation

## 2017-09-27 DIAGNOSIS — Z711 Person with feared health complaint in whom no diagnosis is made: Secondary | ICD-10-CM

## 2017-09-27 DIAGNOSIS — R102 Pelvic and perineal pain: Secondary | ICD-10-CM

## 2017-09-27 DIAGNOSIS — B9689 Other specified bacterial agents as the cause of diseases classified elsewhere: Secondary | ICD-10-CM

## 2017-09-27 LAB — CBC
HEMATOCRIT: 39.4 % (ref 36.0–46.0)
HEMOGLOBIN: 13.4 g/dL (ref 12.0–15.0)
MCH: 28.2 pg (ref 26.0–34.0)
MCHC: 34 g/dL (ref 30.0–36.0)
MCV: 82.9 fL (ref 78.0–100.0)
Platelets: 295 10*3/uL (ref 150–400)
RBC: 4.75 MIL/uL (ref 3.87–5.11)
RDW: 14.7 % (ref 11.5–15.5)
WBC: 8 10*3/uL (ref 4.0–10.5)

## 2017-09-27 LAB — COMPREHENSIVE METABOLIC PANEL
ALBUMIN: 4.4 g/dL (ref 3.5–5.0)
ALT: 8 U/L — ABNORMAL LOW (ref 14–54)
ANION GAP: 6 (ref 5–15)
AST: 15 U/L (ref 15–41)
Alkaline Phosphatase: 44 U/L (ref 38–126)
BUN: 10 mg/dL (ref 6–20)
CO2: 24 mmol/L (ref 22–32)
Calcium: 9 mg/dL (ref 8.9–10.3)
Chloride: 109 mmol/L (ref 101–111)
Creatinine, Ser: 0.75 mg/dL (ref 0.44–1.00)
GFR calc Af Amer: >60 mL/min (ref >60)
GFR calc non Af Amer: >60 mL/min (ref >60)
GLUCOSE: 108 mg/dL — AB (ref 65–99)
POTASSIUM: 3.9 mmol/L (ref 3.5–5.1)
SODIUM: 139 mmol/L (ref 135–145)
Total Bilirubin: 0.8 mg/dL (ref 0.3–1.2)
Total Protein: 7 g/dL (ref 6.5–8.1)

## 2017-09-27 LAB — URINALYSIS, ROUTINE W REFLEX MICROSCOPIC
Bilirubin Urine: NEGATIVE
Glucose, UA: NEGATIVE mg/dL
Hgb urine dipstick: NEGATIVE
Ketones, ur: 5 mg/dL — AB
LEUKOCYTES UA: NEGATIVE
NITRITE: NEGATIVE
Protein, ur: NEGATIVE mg/dL
SPECIFIC GRAVITY, URINE: 1.026 (ref 1.005–1.030)
pH: 5 (ref 5.0–8.0)

## 2017-09-27 LAB — WET PREP, GENITAL
SPERM: NONE SEEN
Trich, Wet Prep: NONE SEEN
Yeast Wet Prep HPF POC: NONE SEEN

## 2017-09-27 LAB — LIPASE, BLOOD: Lipase: 22 U/L (ref 11–51)

## 2017-09-27 LAB — I-STAT BETA HCG BLOOD, ED (MC, WL, AP ONLY)

## 2017-09-27 MED ORDER — CEFTRIAXONE SODIUM 250 MG IJ SOLR
250.0000 mg | Freq: Once | INTRAMUSCULAR | Status: AC
  Administered 2017-09-27: 250 mg via INTRAMUSCULAR
  Filled 2017-09-27: qty 250

## 2017-09-27 MED ORDER — AZITHROMYCIN 250 MG PO TABS
1000.0000 mg | ORAL_TABLET | Freq: Once | ORAL | Status: AC
  Administered 2017-09-27: 1000 mg via ORAL
  Filled 2017-09-27: qty 4

## 2017-09-27 MED ORDER — METRONIDAZOLE 500 MG PO TABS: 500.0000 mg | ORAL_TABLET | Freq: Two times a day (BID) | ORAL | 0 refills | Status: DC

## 2017-09-27 MED ORDER — NAPROXEN 375 MG PO TABS: 375.0000 mg | ORAL_TABLET | Freq: Two times a day (BID) | ORAL | 0 refills | Status: DC

## 2017-09-27 MED ORDER — DIPHENHYDRAMINE HCL 50 MG/ML IJ SOLN
25.0000 mg | Freq: Once | INTRAMUSCULAR | Status: AC
  Administered 2017-09-27: 25 mg via INTRAVENOUS
  Filled 2017-09-27: qty 1

## 2017-09-27 MED ORDER — ONDANSETRON HCL 4 MG/2ML IJ SOLN
4.0000 mg | Freq: Once | INTRAMUSCULAR | Status: AC
  Administered 2017-09-27: 4 mg via INTRAVENOUS
  Filled 2017-09-27: qty 2

## 2017-09-27 MED ORDER — SODIUM CHLORIDE 0.9 % IV BOLUS (SEPSIS)
1000.0000 mL | Freq: Once | INTRAVENOUS | Status: AC
  Administered 2017-09-27: 1000 mL via INTRAVENOUS

## 2017-09-27 MED ORDER — FENTANYL CITRATE (PF) 100 MCG/2ML IJ SOLN
50.0000 ug | Freq: Once | INTRAMUSCULAR | Status: AC
  Administered 2017-09-27: 50 ug via INTRAVENOUS
  Filled 2017-09-27: qty 2

## 2017-09-27 MED ORDER — STERILE WATER FOR INJECTION IJ SOLN
INTRAMUSCULAR | Status: AC
  Administered 2017-09-27: 2 mL
  Filled 2017-09-27: qty 10

## 2017-09-27 NOTE — ED Provider Notes (Signed)
[facility] DEPT Provider Note   CSN: [id number] Arrival date & time: 09/27/17  1330     History   Chief Complaint Chief Complaint  Patient presents with  . Abdominal Pain    HPI Brittany Smith is a 41 y.o. female.  Brittany Smith is a 41 y.o. Female who presents to the emergency department complaining of 2 days of right lower quadrant and right pelvic pain.  She reports her pain is constant and sometimes can fluctuate in intensity.  She reports associated nausea but no vomiting.  She reports she feels like she is having pelvic cramping like she is on her menstrual cycle.   She denies vaginal bleeding or vaginal discharge.  Last menstrual cycle was 09/19/2017.  She reports she still having some slight spotting consistent with being on her menstrual cycle.  She denies vaginal discharge.  No treatments attempted prior to arrival. She denies urinary symptoms, fevers, vaginal discharge, urinary symptoms, coughing, chest pain and shortness of breath.    The history is provided by the patient and medical records.  Abdominal Pain   Associated symptoms include nausea. Pertinent negatives include fever, diarrhea, vomiting, dysuria, frequency and headaches.    History reviewed. No pertinent past medical history.  There are no active problems to display for this patient.   Past Surgical History:  Procedure Laterality Date  . ANTERIOR CRUCIATE LIGAMENT REPAIR    . KNEE ARTHROSCOPY WITH ANTERIOR CRUCIATE LIGAMENT (ACL) REPAIR    . TUBAL LIGATION      OB History    No data available       Home Medications    Prior to Admission medications   Medication Sig Start Date End Date Taking? Authorizing Provider  metroNIDAZOLE (FLAGYL) 500 MG tablet Take 1 tablet (500 mg total) by mouth 2 (two) times daily. 09/27/17   Waynetta Pean, PA-C  naproxen (NAPROSYN) 375 MG tablet Take 1 tablet (375 mg total) by mouth 2 (two) times daily with a meal.  09/27/17   Waynetta Pean, PA-C    Family History No family history on file.  Social History Social History   Tobacco Use  . Smoking status: Former Smoker    Last attempt to quit: 10/05/2012    Years since quitting: 4.9  . Smokeless tobacco: Former Network engineer Use Topics  . Alcohol use: Yes    Comment: occasionally- weekends  . Drug use: No     Allergies   Ceftriaxone and Tramadol   Review of Systems Review of Systems  Constitutional: Negative for chills and fever.  HENT: Negative for congestion and sore throat.   Eyes: Negative for visual disturbance.  Respiratory: Negative for cough and shortness of breath.   Cardiovascular: Negative for chest pain.  Gastrointestinal: Positive for abdominal pain and nausea. Negative for diarrhea and vomiting.  Genitourinary: Positive for pelvic pain. Negative for difficulty urinating, dysuria, frequency, urgency, vaginal bleeding and vaginal discharge.  Musculoskeletal: Negative for back pain and neck pain.  Skin: Negative for rash.  Neurological: Negative for headaches.     Physical Exam Updated Vital Signs BP 116/75 (BP Location: Right Arm)   Pulse 60   Temp 98.3 F (36.8 C) (Oral)   Resp 16   Ht 5\' 10"  (1.778 m)   Wt 72.6 kg (160 lb)   LMP 09/19/2017   SpO2 99%   BMI 22.96 kg/m   Physical Exam  Constitutional: She appears well-developed and well-nourished.  Non-toxic appearance. She does not appear ill.  No distress.  HENT:  Head: Normocephalic and atraumatic.  Mouth/Throat: Oropharynx is clear and moist.  Eyes: Conjunctivae are normal. Pupils are equal, round, and reactive to light. Right eye exhibits no discharge. Left eye exhibits no discharge.  Neck: Neck supple.  Cardiovascular: Normal rate, regular rhythm, normal heart sounds and intact distal pulses. Exam reveals no gallop and no friction rub.  No murmur heard. Pulmonary/Chest: Effort normal and breath sounds normal. No respiratory distress. She has no  wheezes. She has no rales.  Abdominal: Soft. There is tenderness.  Abdomen is soft.  Bowel sounds are present.  Patient has mild right adnexal tenderness to palpation.  No rebound tenderness.  No psoas or obturator sign.  No CVA or flank tenderness.  Genitourinary: Uterus is not tender. Cervix exhibits no motion tenderness. Right adnexum displays no tenderness and no fullness. Left adnexum displays no tenderness and no fullness. No bleeding in the vagina. Vaginal discharge found.  Genitourinary Comments: Pelvic exam with female nurse tech as chaperone.  Mild amount of yellowish vaginal discharge.  Cervix is closed.  No cervical motion tenderness.  No adnexal tenderness or fullness.  Musculoskeletal: She exhibits no edema.  Lymphadenopathy:    She has no cervical adenopathy.  Neurological: She is alert. Coordination normal.  Skin: Skin is warm and dry. No rash noted. She is not diaphoretic. No erythema. No pallor.  Psychiatric: She has a normal mood and affect. Her behavior is normal.  Nursing note and vitals reviewed.    ED Treatments / Results  Labs (all labs ordered are listed, but only abnormal results are displayed) Labs Reviewed  WET PREP, GENITAL - Abnormal; Notable for the following components:      Result Value   Clue Cells Wet Prep HPF POC PRESENT (*)    WBC, Wet Prep HPF POC MANY (*)    All other components within normal limits  COMPREHENSIVE METABOLIC PANEL - Abnormal; Notable for the following components:   Glucose, Bld 108 (*)    ALT 8 (*)    All other components within normal limits  URINALYSIS, ROUTINE W REFLEX MICROSCOPIC - Abnormal; Notable for the following components:   Ketones, ur 5 (*)    All other components within normal limits  LIPASE, BLOOD  CBC  RPR  HIV ANTIBODY (ROUTINE TESTING)  I-STAT BETA HCG BLOOD, ED ([facility], [facility], [facility] ONLY)  GC/CHLAMYDIA PROBE AMP ([place]) NOT AT [facility]    EKG  EKG Interpretation None       Radiology US Transvaginal  Non-ob  Result Date: 09/27/2017 CLINICAL DATA:  Right lower quadrant and right adnexal pain. EXAM: TRANSABDOMINAL AND TRANSVAGINAL ULTRASOUND OF PELVIS DOPPLER ULTRASOUND OF OVARIES TECHNIQUE: Both transabdominal and transvaginal ultrasound examinations of the pelvis were performed. Transabdominal technique was performed for global imaging of the pelvis including uterus, ovaries, adnexal regions, and pelvic cul-de-sac. It was necessary to proceed with endovaginal exam following the transabdominal exam to visualize the adnexal structures to an adequate degree. Color and duplex Doppler ultrasound was utilized to evaluate blood flow to the ovaries. COMPARISON:  None. FINDINGS: Uterus Measurements: 9.4 x 4.3 x 5.4 cm. No fibroids or other mass visualized. Endometrium Thickness: 6 mm. No focal abnormality visualized. No mass or fluid identified within the endometrial canal. Right ovary Measurements: 3.7 x 2.3 x 3 cm. Normal appearance/no adnexal mass. Normal small ovarian follicles. Left ovary Measurements: 3.7 x 2.1 x 1.9 cm. Normal appearance/no adnexal mass. Normal small ovarian follicles. Pulsed Doppler evaluation of both ovaries demonstrates  normal low-resistance arterial and venous waveforms. Other findings No abnormal free fluid. IMPRESSION: Normal pelvic ultrasound. Ovaries appear normal. No evidence of ovarian torsion. No mass or free fluid within either adnexal region. Electronically Signed   By: Franki Cabot M.D.   On: 09/27/2017 19:09   US Pelvis Complete  Result Date: 09/27/2017 CLINICAL DATA:  Right lower quadrant and right adnexal pain. EXAM: TRANSABDOMINAL AND TRANSVAGINAL ULTRASOUND OF PELVIS DOPPLER ULTRASOUND OF OVARIES TECHNIQUE: Both transabdominal and transvaginal ultrasound examinations of the pelvis were performed. Transabdominal technique was performed for global imaging of the pelvis including uterus, ovaries, adnexal regions, and pelvic cul-de-sac. It was necessary to proceed with  endovaginal exam following the transabdominal exam to visualize the adnexal structures to an adequate degree. Color and duplex Doppler ultrasound was utilized to evaluate blood flow to the ovaries. COMPARISON:  None. FINDINGS: Uterus Measurements: 9.4 x 4.3 x 5.4 cm. No fibroids or other mass visualized. Endometrium Thickness: 6 mm. No focal abnormality visualized. No mass or fluid identified within the endometrial canal. Right ovary Measurements: 3.7 x 2.3 x 3 cm. Normal appearance/no adnexal mass. Normal small ovarian follicles. Left ovary Measurements: 3.7 x 2.1 x 1.9 cm. Normal appearance/no adnexal mass. Normal small ovarian follicles. Pulsed Doppler evaluation of both ovaries demonstrates normal low-resistance arterial and venous waveforms. Other findings No abnormal free fluid. IMPRESSION: Normal pelvic ultrasound. Ovaries appear normal. No evidence of ovarian torsion. No mass or free fluid within either adnexal region. Electronically Signed   By: Franki Cabot M.D.   On: 09/27/2017 19:09   [place] Art/ven Flow Abd Pelv Doppler  Result Date: 09/27/2017 CLINICAL DATA:  Right lower quadrant and right adnexal pain. EXAM: TRANSABDOMINAL AND TRANSVAGINAL ULTRASOUND OF PELVIS DOPPLER ULTRASOUND OF OVARIES TECHNIQUE: Both transabdominal and transvaginal ultrasound examinations of the pelvis were performed. Transabdominal technique was performed for global imaging of the pelvis including uterus, ovaries, adnexal regions, and pelvic cul-de-sac. It was necessary to proceed with endovaginal exam following the transabdominal exam to visualize the adnexal structures to an adequate degree. Color and duplex Doppler ultrasound was utilized to evaluate blood flow to the ovaries. COMPARISON:  None. FINDINGS: Uterus Measurements: 9.4 x 4.3 x 5.4 cm. No fibroids or other mass visualized. Endometrium Thickness: 6 mm. No focal abnormality visualized. No mass or fluid identified within the endometrial canal. Right ovary  Measurements: 3.7 x 2.3 x 3 cm. Normal appearance/no adnexal mass. Normal small ovarian follicles. Left ovary Measurements: 3.7 x 2.1 x 1.9 cm. Normal appearance/no adnexal mass. Normal small ovarian follicles. Pulsed Doppler evaluation of both ovaries demonstrates normal low-resistance arterial and venous waveforms. Other findings No abnormal free fluid. IMPRESSION: Normal pelvic ultrasound. Ovaries appear normal. No evidence of ovarian torsion. No mass or free fluid within either adnexal region. Electronically Signed   By: Franki Cabot M.D.   On: 09/27/2017 19:09    Procedures Procedures (including critical care time)  Medications Ordered in ED Medications  sodium chloride 0.9 % bolus 1,000 mL (0 mLs Intravenous Stopped 09/27/17 2134)  ondansetron (ZOFRAN) injection 4 mg (4 mg Intravenous Given 09/27/17 1745)  fentaNYL (SUBLIMAZE) injection 50 mcg (50 mcg Intravenous Given 09/27/17 1813)  diphenhydrAMINE (BENADRYL) injection 25 mg (25 mg Intravenous Given 09/27/17 2130)  cefTRIAXone (ROCEPHIN) injection 250 mg (250 mg Intramuscular Given 09/27/17 2219)  azithromycin (ZITHROMAX) tablet 1,000 mg (1,000 mg Oral Given 09/27/17 2218)  sterile water (preservative free) injection (2 mLs  Given 09/27/17 2219)     Initial Impression / Assessment and Plan /  ED Course  I have reviewed the triage vital signs and the nursing notes.  Pertinent labs & imaging results that were available during my care of the patient were reviewed by me and considered in my medical decision making (see chart for details).    This is a 41 y.o. Female who presents to the emergency department complaining of 2 days of right lower quadrant and right pelvic pain.  She reports her pain is constant and sometimes can fluctuate in intensity.  She reports associated nausea but no vomiting.  She reports she feels like she is having pelvic cramping like she is on her menstrual cycle.   She denies vaginal bleeding or vaginal discharge.   Last menstrual cycle was 09/19/2017.  She reports she still having some slight spotting consistent with being on her menstrual cycle.  She denies vaginal discharge.  No treatments attempted prior to arrival.  On exam patient is afebrile nontoxic-appearing.  Her abdomen is soft and she has right adnexal tenderness to palpation.  No rebound tenderness.  No psoas or obturator sign. Pregnancy test is negative.  Lipase is within normal limits.  CMP is unremarkable.  CBC is unremarkable.  No leukocytosis.  Pelvic ultrasound with Doppler was obtained.  This is normal.  No evidence of torsion.  No adnexal masses or free fluid in the pelvis. Pelvic exam was performed.  Mild amount of yellowish vaginal discharge.  No cervical motion tenderness.  No adnexal tenderness or fullness.  Concern for STD with wet prep revealing many white blood cells and clue cells.  I discussed options with the patient.  She is feeling much better at reevaluation.  Will provide with Rocephin and azithromycin in the emergency department.  She has had a problem with itching after Rocephin previously.  I spoke with pharmacy recommends pretreating the patient with IV Benadryl. At reevaluation following IM Rocephin patient reports she is feeling fine.  She was monitored for about an hour post injection of Rocephin.  No rashes no trouble breathing or swallowing.  Will discharge with prescription for Flagyl.  We will have her follow-up closely with primary care.  I encouraged her to follow-up on STD test results in about 3 days on my chart.  Patient agrees.  Safe sex practices discussed.  Return precautions discussed. I advised the patient to follow-up with their primary care provider this week. I advised the patient to return to the emergency department with new or worsening symptoms or new concerns. The patient verbalized understanding and agreement with plan.      Final Clinical Impressions(s) / ED Diagnoses   Final diagnoses:  Concern  about STD in female without diagnosis  BV (bacterial vaginosis)  Right lower quadrant abdominal pain  Adnexal pain    ED Discharge Orders        Ordered    metroNIDAZOLE (FLAGYL) 500 MG tablet  2 times daily     09/27/17 2252    naproxen (NAPROSYN) 375 MG tablet  2 times daily with meals     09/27/17 2252       Waynetta Pean, PA-C 09/27/17 [place], Ankit, MD 09/28/17 [phone]

## 2017-09-27 NOTE — ED Notes (Signed)
Pt ambulatory and independent at discharge.  Verbalized understanding of discharge instructions 

## 2017-09-27 NOTE — ED Triage Notes (Signed)
Patient c/o RLQ pain with nausea x2 days. Denies diarrhea.

## 2017-09-28 LAB — HIV ANTIBODY (ROUTINE TESTING W REFLEX): HIV Screen 4th Generation wRfx: NONREACTIVE

## 2017-09-28 LAB — RPR: RPR: NONREACTIVE

## 2017-09-28 LAB — GC/CHLAMYDIA PROBE AMP (~~LOC~~) NOT AT ARMC
CHLAMYDIA, DNA PROBE: NEGATIVE
NEISSERIA GONORRHEA: NEGATIVE

## 2018-04-06 ENCOUNTER — Other Ambulatory Visit: Payer: Self-pay

## 2018-04-06 ENCOUNTER — Emergency Department (HOSPITAL_COMMUNITY)
Admission: EM | Admit: 2018-04-06 | Discharge: 2018-04-06 | Disposition: A | Discharge status: Home / Self Care | Payer: Self-pay | Attending: Emergency Medicine | Admitting: Emergency Medicine

## 2018-04-06 ENCOUNTER — Emergency Department (HOSPITAL_COMMUNITY): Payer: Self-pay

## 2018-04-06 ENCOUNTER — Encounter (HOSPITAL_COMMUNITY): Payer: Self-pay | Admitting: Emergency Medicine

## 2018-04-06 DIAGNOSIS — Z79899 Other long term (current) drug therapy: Secondary | ICD-10-CM | POA: Insufficient documentation

## 2018-04-06 DIAGNOSIS — R079 Chest pain, unspecified: Secondary | ICD-10-CM | POA: Insufficient documentation

## 2018-04-06 DIAGNOSIS — R0789 Other chest pain: Secondary | ICD-10-CM

## 2018-04-06 DIAGNOSIS — Z87891 Personal history of nicotine dependence: Secondary | ICD-10-CM | POA: Insufficient documentation

## 2018-04-06 LAB — BASIC METABOLIC PANEL
Anion gap: 6 (ref 5–15)
BUN: 10 mg/dL (ref 6–20)
CALCIUM: 8.8 mg/dL — AB (ref 8.9–10.3)
CO2: 24 mmol/L (ref 22–32)
Chloride: 110 mmol/L (ref 98–111)
Creatinine, Ser: 0.73 mg/dL (ref 0.44–1.00)
GFR calc Af Amer: >60 mL/min (ref >60)
GFR calc non Af Amer: >60 mL/min (ref >60)
GLUCOSE: 94 mg/dL (ref 70–99)
Potassium: 4.2 mmol/L (ref 3.5–5.1)
Sodium: 140 mmol/L (ref 135–145)

## 2018-04-06 LAB — CBC
HCT: 38.8 % (ref 36.0–46.0)
Hemoglobin: 13.2 g/dL (ref 12.0–15.0)
MCH: 28.1 pg (ref 26.0–34.0)
MCHC: 34 g/dL (ref 30.0–36.0)
MCV: 82.7 fL (ref 78.0–100.0)
PLATELETS: 307 10*3/uL (ref 150–400)
RBC: 4.69 MIL/uL (ref 3.87–5.11)
RDW: 14.4 % (ref 11.5–15.5)
WBC: 7 10*3/uL (ref 4.0–10.5)

## 2018-04-06 LAB — I-STAT TROPONIN, ED: TROPONIN I, POC: 0 ng/mL (ref 0.00–0.08)

## 2018-04-06 LAB — I-STAT BETA HCG BLOOD, ED (MC, WL, AP ONLY): I-stat hCG, quantitative: <5 m[IU]/mL (ref <5)

## 2018-04-06 MED ORDER — NAPROXEN 500 MG PO TABS: 500.0000 mg | ORAL_TABLET | Freq: Two times a day (BID) | ORAL | 0 refills | Status: DC

## 2018-04-06 NOTE — ED Provider Notes (Signed)
[facility] DEPT Provider Note   CSN: [id number] Arrival date & time: 04/06/18  1332     History   Chief Complaint Chief Complaint  Patient presents with  . Chest Pain    HPI Brittany Smith is a 42 y.o. female.  42 year old female who presents with chest pain.  The patient states that she has periodically had central chest pain for years, ever since high school.  She has been told in the past that it was related to muscle strain or stress.  Today between 10 and 11 AM just after she had woken up.  She began having central chest pain that she describes as dull but sharp with deep inspiration and with certain movements including sitting up and raising her right arm.  Pain has been constant since it began.  No associated shortness of breath, nausea vomiting, or cough/cold symptoms.  No fevers or recent illness.  She tried ibuprofen with no relief.  She denies any recent change in physical activity or heavy lifting.  No recent travel, history of cancer, history of blood clots, OCP use, or leg swelling/pain.  This feels exactly like previous episodes of chest pain.  No family history of heart disease.  The history is provided by the patient.  Chest Pain      History reviewed. No pertinent past medical history.  There are no active problems to display for this patient.   Past Surgical History:  Procedure Laterality Date  . ANTERIOR CRUCIATE LIGAMENT REPAIR    . KNEE ARTHROSCOPY WITH ANTERIOR CRUCIATE LIGAMENT (ACL) REPAIR    . TUBAL LIGATION       OB History   None      Home Medications    Prior to Admission medications   Medication Sig Start Date End Date Taking? Authorizing Provider  ibuprofen (ADVIL,MOTRIN) 200 MG tablet Take 400 mg by mouth every 6 (six) hours as needed for moderate pain.   Yes [provider]  metroNIDAZOLE (FLAGYL) 500 MG tablet Take 1 tablet (500 mg total) by mouth 2 (two) times daily. Patient not taking:  Reported on 04/06/2018 09/27/17   Waynetta Pean, PA-C  naproxen (NAPROSYN) 375 MG tablet Take 1 tablet (375 mg total) by mouth 2 (two) times daily with a meal. Patient not taking: Reported on 04/06/2018 09/27/17   Waynetta Pean, PA-C    Family History No family history on file.  Social History Social History   Tobacco Use  . Smoking status: Former Smoker    Last attempt to quit: 10/05/2012    Years since quitting: 5.5  . Smokeless tobacco: Former Network engineer Use Topics  . Alcohol use: Yes    Comment: occasionally- weekends  . Drug use: No     Allergies   Ceftriaxone and Tramadol   Review of Systems Review of Systems  Cardiovascular: Positive for chest pain.   All other systems reviewed and are negative except that which was mentioned in HPI   Physical Exam Updated Vital Signs BP 111/60 (BP Location: Left Arm)   Pulse 69   Temp 98.4 F (36.9 C) (Oral)   Resp 18   LMP 04/02/2018   SpO2 100%   Physical Exam  Constitutional: She is oriented to person, place, and time. She appears well-developed and well-nourished. No distress.  HENT:  Head: Normocephalic and atraumatic.  Moist mucous membranes  Eyes: Conjunctivae are normal.  Neck: Neck supple.  Cardiovascular: Normal rate, regular rhythm and normal heart sounds.  No  murmur heard. Pulmonary/Chest: Effort normal and breath sounds normal.  Abdominal: Soft. Bowel sounds are normal. She exhibits no distension. There is no tenderness.  Musculoskeletal: She exhibits no edema.       Right lower leg: She exhibits no tenderness.       Left lower leg: She exhibits no tenderness.  Chest pain with raising R arm above 90 degrees  Neurological: She is alert and oriented to person, place, and time.  Fluent speech  Skin: Skin is warm and dry.  Psychiatric: She has a normal mood and affect. Judgment normal.  Nursing note and vitals reviewed.    ED Treatments / Results  Labs (all labs ordered are listed, but only  abnormal results are displayed) Labs Reviewed  BASIC METABOLIC PANEL - Abnormal; Notable for the following components:      Result Value   Calcium 8.8 (*)    All other components within normal limits  CBC  I-STAT TROPONIN, ED  I-STAT BETA HCG BLOOD, ED ([facility], [facility], [facility] ONLY)    EKG EKG Interpretation  Date/Time:  Friday April 06 2018 13:46:38 EDT Ventricular Rate:  62 PR Interval:  158 QRS Duration: 86 QT Interval:  392 QTC Calculation: 397 R Axis:   80 Text Interpretation:  Normal sinus rhythm Normal ECG No significant change since last tracing Confirmed by Theotis Burrow [phone]) on 04/06/2018 4:38:55 PM   Radiology Dg Chest 2 View  Result Date: 04/06/2018 CLINICAL DATA:  Chest pain and shortness of breath EXAM: CHEST - 2 VIEW COMPARISON:  April 30, 2016 FINDINGS: Lungs are clear. Heart size and pulmonary vascularity are normal. No adenopathy. No pneumothorax. No bone lesions. IMPRESSION: No edema or consolidation. Electronically Signed   By: Lowella Grip III M.D.   On: 04/06/2018 14:36    Procedures Procedures (including critical care time)  Medications Ordered in ED Medications - No data to display   Initial Impression / Assessment and Plan / ED Course  I have reviewed the triage vital signs and the nursing notes.  Pertinent labs & imaging results that were available during my care of the patient were reviewed by me and considered in my medical decision making (see chart for details).    Well-appearing on exam, normal vital signs, EKG without ischemic changes, similar to previous.  I could reproduce her pain with sitting up and moving her right arm.  Given normal vital signs, she is PERC negative therefore PE very unlikely.  Work is unremarkable and shows negative troponin.  Pain has been constant for greater than 6 hours and negative troponin after 3 hours is sufficient to r/o ACS especially given atypical symptoms. HEART score is 0.  Discussed supportive measures including  NSAID course and no heavy lifting or extreme exertion.  Extensively reviewed return precautions.  Patient voiced understanding.  Final Clinical Impressions(s) / ED Diagnoses   Final diagnoses:  None    ED Discharge Orders    None       Pamela Maddy, Wenda [place], MD 04/06/18 1718

## 2018-04-06 NOTE — ED Triage Notes (Signed)
Pt complaint of left/mid chest pain onset 1000-1100 today; headache as well.

## 2018-06-30 ENCOUNTER — Other Ambulatory Visit (HOSPITAL_COMMUNITY): Payer: Self-pay | Admitting: Radiology

## 2018-06-30 ENCOUNTER — Emergency Department (HOSPITAL_COMMUNITY): Payer: Self-pay

## 2018-06-30 ENCOUNTER — Other Ambulatory Visit: Payer: Self-pay

## 2018-06-30 ENCOUNTER — Emergency Department (HOSPITAL_COMMUNITY)
Admission: EM | Admit: 2018-06-30 | Discharge: 2018-06-30 | Disposition: A | Discharge status: Home / Self Care | Payer: Self-pay | Attending: Emergency Medicine | Admitting: Emergency Medicine

## 2018-06-30 DIAGNOSIS — Z87891 Personal history of nicotine dependence: Secondary | ICD-10-CM | POA: Insufficient documentation

## 2018-06-30 DIAGNOSIS — R1031 Right lower quadrant pain: Secondary | ICD-10-CM | POA: Insufficient documentation

## 2018-06-30 LAB — COMPREHENSIVE METABOLIC PANEL
ALT: 11 U/L (ref 0–44)
AST: 18 U/L (ref 15–41)
Albumin: 4.1 g/dL (ref 3.5–5.0)
Alkaline Phosphatase: 44 U/L (ref 38–126)
Anion gap: 8 (ref 5–15)
BUN: 11 mg/dL (ref 6–20)
CO2: 22 mmol/L (ref 22–32)
Calcium: 9.4 mg/dL (ref 8.9–10.3)
Chloride: 108 mmol/L (ref 98–111)
Creatinine, Ser: 0.67 mg/dL (ref 0.44–1.00)
GFR calc Af Amer: >60 mL/min (ref >60)
GFR calc non Af Amer: >60 mL/min (ref >60)
Glucose, Bld: 95 mg/dL (ref 70–99)
Potassium: 3.8 mmol/L (ref 3.5–5.1)
Sodium: 138 mmol/L (ref 135–145)
Total Bilirubin: 0.5 mg/dL (ref 0.3–1.2)
Total Protein: 6.5 g/dL (ref 6.5–8.1)

## 2018-06-30 LAB — CBC
HCT: 40.2 % (ref 36.0–46.0)
Hemoglobin: 13.4 g/dL (ref 12.0–15.0)
MCH: 27.7 pg (ref 26.0–34.0)
MCHC: 33.3 g/dL (ref 30.0–36.0)
MCV: 83.2 fL (ref 78.0–100.0)
Platelets: 287 10*3/uL (ref 150–400)
RBC: 4.83 MIL/uL (ref 3.87–5.11)
RDW: 14.4 % (ref 11.5–15.5)
WBC: 6.6 10*3/uL (ref 4.0–10.5)

## 2018-06-30 LAB — URINALYSIS, ROUTINE W REFLEX MICROSCOPIC
Bilirubin Urine: NEGATIVE
Glucose, UA: NEGATIVE mg/dL
Hgb urine dipstick: NEGATIVE
Ketones, ur: 20 mg/dL — AB
Leukocytes, UA: NEGATIVE
Nitrite: NEGATIVE
Protein, ur: NEGATIVE mg/dL
Specific Gravity, Urine: 1.023 (ref 1.005–1.030)
pH: 7 (ref 5.0–8.0)

## 2018-06-30 LAB — LIPASE, BLOOD: Lipase: 28 U/L (ref 11–51)

## 2018-06-30 LAB — I-STAT BETA HCG BLOOD, ED (MC, WL, AP ONLY): I-stat hCG, quantitative: <5 m[IU]/mL (ref <5)

## 2018-06-30 MED ORDER — SODIUM CHLORIDE 0.9 % IV BOLUS
1000.0000 mL | Freq: Once | INTRAVENOUS | Status: AC
  Administered 2018-06-30: 1000 mL via INTRAVENOUS

## 2018-06-30 MED ORDER — ONDANSETRON 4 MG PO TBDP
4.0000 mg | ORAL_TABLET | Freq: Once | ORAL | Status: AC | PRN
  Administered 2018-06-30: 4 mg via ORAL
  Filled 2018-06-30: qty 1

## 2018-06-30 MED ORDER — MORPHINE SULFATE (PF) 2 MG/ML IV SOLN
2.0000 mg | Freq: Once | INTRAVENOUS | Status: AC
Start: 2018-06-30 — End: 2018-06-30
  Administered 2018-06-30: 2 mg via INTRAVENOUS
  Filled 2018-06-30: qty 1

## 2018-06-30 MED ORDER — IOHEXOL 300 MG/ML  SOLN
100.0000 mL | Freq: Once | INTRAMUSCULAR | Status: AC | PRN
  Administered 2018-06-30: 100 mL via INTRAVENOUS

## 2018-06-30 MED ORDER — OXYCODONE-ACETAMINOPHEN 5-325 MG PO TABS
2.0000 | ORAL_TABLET | Freq: Once | ORAL | Status: AC
  Administered 2018-06-30: 2 via ORAL
  Filled 2018-06-30: qty 2

## 2018-06-30 NOTE — ED Notes (Signed)
Patient transported to CT 

## 2018-06-30 NOTE — ED Provider Notes (Signed)
[facility] EMERGENCY DEPARTMENT Provider Note   CSN: [id number] Arrival date & time: 06/30/18  1550   History   Chief Complaint Chief Complaint  Patient presents with  . Abdominal Pain    HPI Brittany Smith is a 42 y.o. female with no significant medical history who presents for evaluation of RLQ pain. States pain started 2 weeks ago however when she awoke this morning she noted intermittent worsening pain to the RLQ. Pain does not radiate. She rates her pain a 6/10. Admits to nausea without vomiting. Denies headache, fever, chills, chest pain, SOB, diarrhea, constipation, vaginal bleeding, back pain, vaginal discharge, dysuria, or urinary frequency.  HPI  No past medical history on file.  There are no active problems to display for this patient.   Past Surgical History:  Procedure Laterality Date  . ANTERIOR CRUCIATE LIGAMENT REPAIR    . KNEE ARTHROSCOPY WITH ANTERIOR CRUCIATE LIGAMENT (ACL) REPAIR    . TUBAL LIGATION       OB History   None      Home Medications    Prior to Admission medications   Medication Sig Start Date End Date Taking? Authorizing Provider  naproxen (NAPROSYN) 500 MG tablet Take 1 tablet (500 mg total) by mouth 2 (two) times daily with a meal. 04/06/18   Little, Wenda Overland, MD    Family History No family history on file.  Social History Social History   Tobacco Use  . Smoking status: Former Smoker    Last attempt to quit: 10/05/2012    Years since quitting: 5.7  . Smokeless tobacco: Former Network engineer Use Topics  . Alcohol use: Yes    Comment: occasionally- weekends  . Drug use: No     Allergies   Ceftriaxone and Tramadol   Review of Systems Review of Systems  Constitutional: Negative for chills and fever.  Respiratory: Negative for cough, chest tightness and shortness of breath.   Cardiovascular: Negative for chest pain.  Gastrointestinal: Positive for abdominal pain and nausea. Negative for  abdominal distention, anal bleeding, blood in stool, constipation, diarrhea and vomiting.  Genitourinary: Negative for decreased urine volume, difficulty urinating, dysuria, flank pain, hematuria, menstrual problem, pelvic pain, urgency, vaginal bleeding, vaginal discharge and vaginal pain.  Musculoskeletal: Negative for back pain.  Skin: Negative for color change and rash.  Neurological: Negative for dizziness and weakness.  All other systems reviewed and are negative.    Physical Exam Updated Vital Signs BP (!) 128/93   Pulse 74   Temp 98.6 F (37 C) (Oral)   Resp 16   Ht 5\' 11"  (1.803 m)   Wt 77.1 kg   LMP 06/23/2018 (Exact Date)   SpO2 100%   BMI 23.71 kg/m   Physical Exam  Constitutional: She appears well-developed and well-nourished.  Non-toxic appearance. She does not appear ill. No distress.  HENT:  Head: Atraumatic.  Mouth/Throat: Oropharynx is clear and moist.  Eyes: Pupils are equal, round, and reactive to light.  Neck: Normal range of motion.  Cardiovascular: Normal rate, regular rhythm, normal heart sounds and intact distal pulses. Exam reveals no gallop and no friction rub.  No murmur heard. Pulmonary/Chest: Effort normal and breath sounds normal. No stridor. No respiratory distress. She has no wheezes. She has no rhonchi. She has no rales. She exhibits no tenderness.  Abdominal: Soft. Normal appearance and bowel sounds are normal. She exhibits no shifting dullness, no distension, no pulsatile liver, no fluid wave, no abdominal bruit, no ascites,  no pulsatile midline mass and no mass. There is no hepatosplenomegaly. There is tenderness in the right lower quadrant. There is no rigidity, no rebound, no guarding, no CVA tenderness, no tenderness at McBurney's point and negative Murphy's sign. No hernia.  Musculoskeletal: Normal range of motion.  Neurological: She is alert.  Skin: Skin is warm and dry.  Psychiatric: She has a normal mood and affect.  Nursing note and  vitals reviewed.    ED Treatments / Results  Labs (all labs ordered are listed, but only abnormal results are displayed) Labs Reviewed  URINALYSIS, ROUTINE W REFLEX MICROSCOPIC - Abnormal; Notable for the following components:      Result Value   APPearance HAZY (*)    Ketones, ur 20 (*)    All other components within normal limits  LIPASE, BLOOD  COMPREHENSIVE METABOLIC PANEL  CBC  I-STAT BETA HCG BLOOD, ED ([facility], [facility], [facility] ONLY)    EKG None  Radiology US Transvaginal Non-ob  Result Date: 06/30/2018 CLINICAL DATA:  Right lower quadrant pain. EXAM: TRANSABDOMINAL AND TRANSVAGINAL ULTRASOUND OF PELVIS DOPPLER ULTRASOUND OF OVARIES TECHNIQUE: Both transabdominal and transvaginal ultrasound examinations of the pelvis were performed. Transabdominal technique was performed for global imaging of the pelvis including uterus, ovaries, adnexal regions, and pelvic cul-de-sac. It was necessary to proceed with endovaginal exam following the transabdominal exam to visualize the endometrium and ovaries. Color and duplex Doppler ultrasound was utilized to evaluate blood flow to the ovaries. COMPARISON:  None. FINDINGS: Uterus Measurements: 8.3 x 4.6 x 5.4 cm. No fibroids or other mass visualized. Endometrium Thickness: 12 mm.  No focal abnormality visualized. Right ovary Measurements: 3.9 x 2.5 x 1.9 cm. Normal appearance/no adnexal mass. Left ovary Measurements: 3.6 x 2.6 x 2.5 cm. Normal appearance/no adnexal mass. Pulsed Doppler evaluation of both ovaries demonstrates normal low-resistance arterial and venous waveforms. Other findings No abnormal free fluid. IMPRESSION: 1. No abnormalities to explain the patient's symptoms identified. The left ovary was only visualized transabdominally. No uterine or ovarian abnormality noted. Electronically Signed   By: Dorise Bullion III M.D   On: 06/30/2018 19:01   US Pelvis Complete  Result Date: 06/30/2018 CLINICAL DATA:  Right lower quadrant pain. EXAM:  TRANSABDOMINAL AND TRANSVAGINAL ULTRASOUND OF PELVIS DOPPLER ULTRASOUND OF OVARIES TECHNIQUE: Both transabdominal and transvaginal ultrasound examinations of the pelvis were performed. Transabdominal technique was performed for global imaging of the pelvis including uterus, ovaries, adnexal regions, and pelvic cul-de-sac. It was necessary to proceed with endovaginal exam following the transabdominal exam to visualize the endometrium and ovaries. Color and duplex Doppler ultrasound was utilized to evaluate blood flow to the ovaries. COMPARISON:  None. FINDINGS: Uterus Measurements: 8.3 x 4.6 x 5.4 cm. No fibroids or other mass visualized. Endometrium Thickness: 12 mm.  No focal abnormality visualized. Right ovary Measurements: 3.9 x 2.5 x 1.9 cm. Normal appearance/no adnexal mass. Left ovary Measurements: 3.6 x 2.6 x 2.5 cm. Normal appearance/no adnexal mass. Pulsed Doppler evaluation of both ovaries demonstrates normal low-resistance arterial and venous waveforms. Other findings No abnormal free fluid. IMPRESSION: 1. No abnormalities to explain the patient's symptoms identified. The left ovary was only visualized transabdominally. No uterine or ovarian abnormality noted. Electronically Signed   By: Dorise Bullion III M.D   On: 06/30/2018 19:01   Ct Abdomen Pelvis W Contrast  Result Date: 06/30/2018 CLINICAL DATA:  Right lower quadrant pain EXAM: CT ABDOMEN AND PELVIS WITH CONTRAST TECHNIQUE: Multidetector CT imaging of the abdomen and pelvis was performed using the standard  protocol following bolus administration of intravenous contrast. CONTRAST:  150mL OMNIPAQUE IOHEXOL 300 MG/ML  SOLN COMPARISON:  Ultrasound 06/30/2018, CT 10/02/2016 FINDINGS: Lower chest: No acute abnormality. Hepatobiliary: No focal liver abnormality is seen. No gallstones, gallbladder wall thickening, or biliary dilatation. Pancreas: Unremarkable. No pancreatic ductal dilatation or surrounding inflammatory changes. Spleen: Normal in size  without focal abnormality. Adrenals/Urinary Tract: Adrenal glands are unremarkable. Kidneys are normal, without renal calculi, focal lesion, or hydronephrosis. Bladder is unremarkable. Stomach/Bowel: Stomach is within normal limits. Appendix appears normal. No evidence of bowel wall thickening, distention, or inflammatory changes. Vascular/Lymphatic: No significant vascular findings are present. No enlarged abdominal or pelvic lymph nodes. Reproductive: Uterus and bilateral adnexa are unremarkable. Other: No significant free fluid.  No free air Musculoskeletal: No acute or significant osseous findings. IMPRESSION: Negative. No CT evidence for acute intra-abdominal or pelvic abnormality. Electronically Signed   By: Donavan Foil M.D.   On: 06/30/2018 20:06   [place] Art/ven Flow Abd Pelv Doppler  Result Date: 06/30/2018 CLINICAL DATA:  Right lower quadrant pain. EXAM: TRANSABDOMINAL AND TRANSVAGINAL ULTRASOUND OF PELVIS DOPPLER ULTRASOUND OF OVARIES TECHNIQUE: Both transabdominal and transvaginal ultrasound examinations of the pelvis were performed. Transabdominal technique was performed for global imaging of the pelvis including uterus, ovaries, adnexal regions, and pelvic cul-de-sac. It was necessary to proceed with endovaginal exam following the transabdominal exam to visualize the endometrium and ovaries. Color and duplex Doppler ultrasound was utilized to evaluate blood flow to the ovaries. COMPARISON:  None. FINDINGS: Uterus Measurements: 8.3 x 4.6 x 5.4 cm. No fibroids or other mass visualized. Endometrium Thickness: 12 mm.  No focal abnormality visualized. Right ovary Measurements: 3.9 x 2.5 x 1.9 cm. Normal appearance/no adnexal mass. Left ovary Measurements: 3.6 x 2.6 x 2.5 cm. Normal appearance/no adnexal mass. Pulsed Doppler evaluation of both ovaries demonstrates normal low-resistance arterial and venous waveforms. Other findings No abnormal free fluid. IMPRESSION: 1. No abnormalities to explain the  patient's symptoms identified. The left ovary was only visualized transabdominally. No uterine or ovarian abnormality noted. Electronically Signed   By: Dorise Bullion III M.D   On: 06/30/2018 19:01    Procedures Procedures (including critical care time)  Medications Ordered in ED Medications  ondansetron (ZOFRAN-ODT) disintegrating tablet 4 mg (4 mg Oral Given 06/30/18 1642)  oxyCODONE-acetaminophen (PERCOCET/ROXICET) 5-325 MG per tablet 2 tablet (2 tablets Oral Given 06/30/18 1642)  sodium chloride 0.9 % bolus 1,000 mL (0 mLs Intravenous Stopped 06/30/18 2032)  morphine 2 MG/ML injection 2 mg (2 mg Intravenous Given 06/30/18 1814)  iohexol (OMNIPAQUE) 300 MG/ML solution 100 mL (100 mLs Intravenous Contrast Given 06/30/18 1944)     Initial Impression / Assessment and Plan / ED Course  I have reviewed the triage vital signs and the nursing notes as well as past medical history.  Pertinent labs & imaging results that were available during my care of the patient were reviewed by me and considered in my medical decision making (see chart for details).  42 year old otherwise healthy female presents for evaluation of RLQ abdominal pain. Mild tenderness in the RLQ. No rebound, guarding. Afebrile, non-septic, non-ill appearing. Will obtain US to r/o torsion or cyst and re-evaluate.  Labs without leukocytosis. [place] negative for torsion, cyst or pelvic pathology. CT negative. Urine negative.  Discussed with patient her pain could be related to a possible STD or could be musculoskeletal nature.  Patient states that she does not want a pelvic exam at this time, states she is not concerned about  any STDs.   Patient is nontoxic, nonseptic appearing, in no apparent distress. Patient does not meet the SIRS or Sepsis criteria.  On repeat exam patient does not have a surgical abdomin and there are no peritoneal signs.  No indication of appendicitis, bowel obstruction, bowel perforation, cholecystitis,  diverticulitis, PID or ectopic pregnancy.  Patient discharged home with symptomatic treatment and given strict instructions for follow-up with their primary care physician.  I have also discussed reasons to return immediately to the ER.  Patient expresses understanding and agrees with plan.    Final Clinical Impressions(s) / ED Diagnoses   Final diagnoses:  Right lower quadrant abdominal pain    ED Discharge Orders    None       Caty Tessler A, PA-C 06/30/18 2119    Virgel Manifold, MD 07/02/18 1358

## 2018-06-30 NOTE — ED Triage Notes (Signed)
Patient to ED c/o recurrent RLQ abdominal pain onset 2 weeks ago that she states is significantly worse today. She reports recently having an ultrasound that showed ovarian cyst on that side. Pain radiates around R flank to her R lower back. Endorses nausea and urinary frequency. Took ibuprofen this morning without relief. Denies vomiting or diarrhea, no fevers/chills.

## 2018-06-30 NOTE — ED Notes (Signed)
Patient transported to US 

## 2018-06-30 NOTE — ED Notes (Signed)
ED Provider at bedside. 

## 2018-06-30 NOTE — Discharge Instructions (Addendum)
You were evaluated today for abdominal pain that you have had for 2 weeks.  Your ultrasound and CT scan were negative for any acute findings. You did not want a pelvic exam during today's visit. I would recommend follow up with your PCP for re-evaluation if your pain does not resolved.  Please return to the ED with any new or worsening symptoms such as:  Get help right away if: Your pain does not go away as soon as your doctor says it should. You cannot stop throwing up. Your pain is only in areas of your belly, such as the right side or the left lower part of the belly. You have bloody or black poop, or poop that looks like tar. You have very bad pain, cramping, or bloating in your belly. You have signs of not having enough fluid or water in your body (dehydration), such as: Dark pee, very little pee, or no pee. Cracked lips. Dry mouth. Sunken eyes. Sleepiness. Weakness.

## 2018-09-17 ENCOUNTER — Other Ambulatory Visit (HOSPITAL_COMMUNITY): Payer: Self-pay | Admitting: *Deleted

## 2018-09-17 DIAGNOSIS — N6452 Nipple discharge: Secondary | ICD-10-CM

## 2018-09-21 ENCOUNTER — Encounter (HOSPITAL_COMMUNITY): Payer: Self-pay

## 2018-09-21 ENCOUNTER — Ambulatory Visit (HOSPITAL_COMMUNITY)
Admission: RE | Admit: 2018-09-21 | Discharge: 2018-09-21 | Disposition: A | Discharge status: Home / Self Care | Payer: Medicaid Other | Source: Ambulatory Visit | Attending: Obstetrics and Gynecology | Admitting: Obstetrics and Gynecology

## 2018-09-21 VITALS — BP 130/90 | Wt 185.0 lb

## 2018-09-21 DIAGNOSIS — N6452 Nipple discharge: Secondary | ICD-10-CM

## 2018-09-21 DIAGNOSIS — N644 Mastodynia: Secondary | ICD-10-CM

## 2018-09-21 DIAGNOSIS — N6310 Unspecified lump in the right breast, unspecified quadrant: Principal | ICD-10-CM

## 2018-09-21 DIAGNOSIS — N6314 Unspecified lump in the right breast, lower inner quadrant: Secondary | ICD-10-CM

## 2018-09-21 DIAGNOSIS — N6324 Unspecified lump in the left breast, lower inner quadrant: Secondary | ICD-10-CM

## 2018-09-21 DIAGNOSIS — N6315 Unspecified lump in the right breast, overlapping quadrants: Secondary | ICD-10-CM

## 2018-09-21 DIAGNOSIS — Z01419 Encounter for gynecological examination (general) (routine) without abnormal findings: Secondary | ICD-10-CM

## 2018-09-21 NOTE — Addendum Note (Signed)
Encounter addended by: Loletta Parish, RN on: 09/21/2018 11:51 AM  Actions taken: Clinical Note Signed

## 2018-09-21 NOTE — Patient Instructions (Addendum)
Explained breast self awareness with Brittany Smith. Let patient know that if today's Pap smear is normal that her next Pap smear is due in one year due to her history of an abnormal Pap smear. Referred patient to the [facility] for a diagnostic mammogram and bilateral breast ultrasound. Appointment scheduled for Monday, September 24, 2018 at 1320. Patient aware of appointment and will be there. Let patient know will follow up with her within the next couple weeks with results of Pa smear and breast discharge by phone. Brittany Smith verbalized understanding.  [name], [name], RN 11:27 AM

## 2018-09-21 NOTE — Progress Notes (Addendum)
Complaints of right breast discharge that is a brownish/red color since the summer of 2019 that is spontaneous. Patient complained of right breast pain that comes and goes. Patient rates the right breast pain at a 3 out of 10. Patient complained of a right breast lump that she recently noticed.  Pap Smear: Pap smear completed today. Last Pap smear was 04/22/2003 at Dr. Marcheta Grammes Office and [facility]. Patient stated she had cryotherapy to follow-up for her abnormal Pap smear in 2004. Per patient has a history of one other abnormal Pap smear in 2002 that a colposcopy was completed for follow-up. Last Pap smear result is in Epic.  Physical exam: Breasts Breasts symmetrical. No skin abnormalities bilateral breasts. No nipple retraction bilateral breasts. A brownish/redish colored of nipple discharge expressed from right breast on exam. A clear colored discharge expressed from the left breast on exam. Sample of discharge from each breast sent to Cytology for evaluation. No lymphadenopathy. Palpated a lump within the left breast at 8 o'clock under the areola. Palpated two lumps within the right breast at 3 o'clock next to areola and 5 o'clock next to areola. Complaints of tenderness when palpated all three lumps. Referred patient to the [facility] for a diagnostic mammogram and bilateral breast ultrasound. Appointment scheduled for Monday, September 24, 2018 at 1320.        Pelvic/Bimanual   Ext Genitalia No lesions, no swelling and no discharge observed on external genitalia.         Vagina Vagina pink and normal texture. No lesions or discharge observed in vagina.          Cervix Cervix is present. Cervix pink and of normal texture. No discharge observed.     Uterus Uterus is present and palpable. Uterus in normal position and normal size.        Adnexae Bilateral ovaries present and palpable. No tenderness on palpation.         Rectovaginal No rectal exam completed today since  patient had no rectal complaints. No skin abnormalities observed on exam.    Smoking History: Patient is a former smoker that quit 10/05/2012.  Patient Navigation: Patient education provided. Access to services provided for patient through [facility] program.   Breast and Cervical Cancer Risk Assessment: Patient has no family history of breast cancer, known genetic mutations, or radiation treatment to the chest before age 65. Patient has a history of cervical dysplasia. Patient has no history of being immunocompromised or DES exposure in-utero.  Risk Assessment    Risk Scores      09/21/2018   Last edited by: Armond Hang, LPN   5-year risk: 0.7 %   Lifetime risk: 9.5 %

## 2018-09-24 ENCOUNTER — Other Ambulatory Visit (HOSPITAL_COMMUNITY): Payer: Self-pay | Admitting: Obstetrics and Gynecology

## 2018-09-24 ENCOUNTER — Ambulatory Visit
Admission: RE | Admit: 2018-09-24 | Discharge: 2018-09-24 | Disposition: A | Discharge status: Home / Self Care | Payer: No Typology Code available for payment source | Source: Ambulatory Visit | Attending: Obstetrics and Gynecology | Admitting: Obstetrics and Gynecology

## 2018-09-24 DIAGNOSIS — N6452 Nipple discharge: Secondary | ICD-10-CM

## 2018-09-24 DIAGNOSIS — N6310 Unspecified lump in the right breast, unspecified quadrant: Secondary | ICD-10-CM

## 2018-09-28 ENCOUNTER — Ambulatory Visit
Admission: RE | Admit: 2018-09-28 | Discharge: 2018-09-28 | Disposition: A | Discharge status: Home / Self Care | Payer: No Typology Code available for payment source | Source: Ambulatory Visit | Attending: Obstetrics and Gynecology | Admitting: Obstetrics and Gynecology

## 2018-09-28 ENCOUNTER — Other Ambulatory Visit (HOSPITAL_COMMUNITY): Payer: Self-pay | Admitting: Obstetrics and Gynecology

## 2018-09-28 DIAGNOSIS — N6310 Unspecified lump in the right breast, unspecified quadrant: Secondary | ICD-10-CM

## 2018-09-28 HISTORY — PX: BREAST BIOPSY: SHX20

## 2018-09-28 LAB — CYTOLOGY - PAP
Adequacy: ABSENT
Diagnosis: NEGATIVE

## 2018-10-05 ENCOUNTER — Encounter (HOSPITAL_COMMUNITY): Payer: Self-pay | Admitting: *Deleted

## 2018-10-08 ENCOUNTER — Telehealth (HOSPITAL_COMMUNITY): Payer: Self-pay | Admitting: *Deleted

## 2018-10-08 NOTE — Telephone Encounter (Signed)
Attempted to call patient to discuss breast discharge and Pap smear results. No one answered the phone. Left voicemail for patient to call me back.

## 2018-10-08 NOTE — Telephone Encounter (Signed)
Patient returned my call and left voicemail. Called patient back. Explained to patient that her Pap smear is normal and HPV was negative. Let her know that her next Pap smear is due in one year due to her previous Pap smear was abnormal. Explained to patient that her breast discharge was benign. Patient stated she has an appointment at [facility] to discuss surgery. Explained to patient that the surgeon fee is covered by [facility] but if surgery is completed that she will need to complete the [employer] Application for the hospital charges. Told patient if she has any questions to give me call. Patient verbalized understanding.

## 2018-10-15 ENCOUNTER — Ambulatory Visit: Payer: Self-pay | Admitting: Surgery

## 2018-10-15 DIAGNOSIS — D241 Benign neoplasm of right breast: Secondary | ICD-10-CM

## 2018-10-19 ENCOUNTER — Emergency Department (HOSPITAL_COMMUNITY): Payer: Medicaid Other

## 2018-10-19 ENCOUNTER — Emergency Department (HOSPITAL_COMMUNITY)
Admission: EM | Admit: 2018-10-19 | Discharge: 2018-10-19 | Disposition: A | Discharge status: Home / Self Care | Payer: Medicaid Other | Attending: Emergency Medicine | Admitting: Emergency Medicine

## 2018-10-19 ENCOUNTER — Other Ambulatory Visit (HOSPITAL_COMMUNITY): Payer: Self-pay | Admitting: Surgery

## 2018-10-19 ENCOUNTER — Encounter (HOSPITAL_COMMUNITY): Payer: Self-pay | Admitting: Emergency Medicine

## 2018-10-19 DIAGNOSIS — Z87891 Personal history of nicotine dependence: Secondary | ICD-10-CM | POA: Insufficient documentation

## 2018-10-19 DIAGNOSIS — Y929 Unspecified place or not applicable: Secondary | ICD-10-CM | POA: Insufficient documentation

## 2018-10-19 DIAGNOSIS — M25562 Pain in left knee: Secondary | ICD-10-CM

## 2018-10-19 DIAGNOSIS — Y999 Unspecified external cause status: Secondary | ICD-10-CM | POA: Insufficient documentation

## 2018-10-19 DIAGNOSIS — S80912A Unspecified superficial injury of left knee, initial encounter: Secondary | ICD-10-CM | POA: Diagnosis present

## 2018-10-19 DIAGNOSIS — Y939 Activity, unspecified: Secondary | ICD-10-CM | POA: Diagnosis not present

## 2018-10-19 DIAGNOSIS — X509XXA Other and unspecified overexertion or strenuous movements or postures, initial encounter: Secondary | ICD-10-CM | POA: Diagnosis not present

## 2018-10-19 DIAGNOSIS — N6452 Nipple discharge: Secondary | ICD-10-CM

## 2018-10-19 DIAGNOSIS — S8392XA Sprain of unspecified site of left knee, initial encounter: Secondary | ICD-10-CM

## 2018-10-19 NOTE — ED Provider Notes (Signed)
[facility] DEPT Provider Note   CSN: [id number] Arrival date & time: 10/19/18  1846     History   Chief Complaint Chief Complaint  Patient presents with  . Knee Pain    HPI Brittany Smith is a 43 y.o. female with a PSHx of L ACL and meniscal tear repair, who presents to the ED with complaints of left knee pain that began just before 2 PM.  Patient states that she had her foot planted and accidentally twisted her left knee while "engaging in some activities".  She describes the pain is 7/10 constant throbbing left knee pain that radiates up and down her leg, worse with movement of the knee, and unrelieved with ibuprofen.  She reports associated swelling.  She denies any bruising, numbness, tingling, focal weakness, or any other complaints at this time.  She had a meniscus tear repair and ACL repair done 9 years ago by Ralie[facility] Ip and follows with them for her orthopedic care.   The history is provided by the patient and medical records. No language interpreter was used.  Knee Pain    History reviewed. No pertinent past medical history.  There are no active problems to display for this patient.   Past Surgical History:  Procedure Laterality Date  . ANTERIOR CRUCIATE LIGAMENT REPAIR    . KNEE ARTHROSCOPY WITH ANTERIOR CRUCIATE LIGAMENT (ACL) REPAIR    . TUBAL LIGATION       OB History    Gravida  3   Para      Term      Preterm      AB  1   Living  2     SAB      TAB      Ectopic  1   Multiple      Live Births  2            Home Medications    Prior to Admission medications   Medication Sig Start Date End Date Taking? Authorizing Provider  naproxen (NAPROSYN) 500 MG tablet Take 1 tablet (500 mg total) by mouth 2 (two) times daily with a meal. Patient not taking: Reported on 09/21/2018 04/06/18   Little, Wenda Overland, MD    Family History No family history on file.  Social History Social History   Tobacco  Use  . Smoking status: Former Smoker    Last attempt to quit: 10/05/2012    Years since quitting: 6.0  . Smokeless tobacco: Former Network engineer Use Topics  . Alcohol use: Yes    Comment: occasionally- weekends  . Drug use: No     Allergies   Ceftriaxone and Tramadol   Review of Systems Review of Systems  Musculoskeletal: Positive for arthralgias and joint swelling.  Skin: Negative for color change.  Allergic/Immunologic: Negative for immunocompromised state.     Physical Exam Updated Vital Signs BP 123/64 (BP Location: Left Arm)   Pulse 75   Temp 98.2 F (36.8 C) (Oral)   Resp 18   LMP 10/04/2018   SpO2 99%   Physical Exam Vitals signs and nursing note reviewed.  Constitutional:      General: She is not in acute distress.    Appearance: Normal appearance. She is well-developed. She is not toxic-appearing.     Comments: Afebrile, nontoxic, NAD  HENT:     Head: Normocephalic and atraumatic.  Eyes:     General:        Right eye: No discharge.  Left eye: No discharge.     Conjunctiva/sclera: Conjunctivae normal.  Neck:     Musculoskeletal: Normal range of motion and neck supple.  Cardiovascular:     Rate and Rhythm: Normal rate.     Pulses: Normal pulses.  Pulmonary:     Effort: Pulmonary effort is normal. No respiratory distress.  Abdominal:     General: There is no distension.  Musculoskeletal:     Left knee: She exhibits decreased range of motion (due to pain) and swelling. She exhibits no ecchymosis, no deformity, no laceration, no erythema, normal alignment, no LCL laxity, normal patellar mobility and no MCL laxity. Tenderness found. Medial joint line tenderness noted.     Comments: L knee with mildly limited ROM due to pain, with mild medial joint line TTP, slight swelling medially but no frank effusion, no crepitus or deformity, no bruising or erythema, no warmth, no abnormal alignment or patellar mobility, no varus/valgus laxity, neg anterior  drawer test. Able to bear some weight through the leg.  Strength and sensation grossly intact, distal pulses intact, compartments soft   Skin:    General: Skin is warm and dry.     Findings: No rash.  Neurological:     Mental Status: She is alert and oriented to person, place, and time.     Sensory: Sensation is intact. No sensory deficit.     Motor: Motor function is intact.  Psychiatric:        Mood and Affect: Mood and affect normal.        Behavior: Behavior normal.      ED Treatments / Results  Labs (all labs ordered are listed, but only abnormal results are displayed) Labs Reviewed - No data to display  EKG None  Radiology Dg Knee Complete 4 Views Left  Result Date: 10/19/2018 CLINICAL DATA:  44 year old female with twisting of the left knee. History of prior ACL repair. EXAM: LEFT KNEE - COMPLETE 4+ VIEW COMPARISON:  Left knee radiograph dated 05/04/2016 FINDINGS: There is no acute fracture or dislocation. There is postsurgical changes of prior ACL repair with slight irregularity of the medial tibial plateau. Mild depression of the medial tibial plateau similar to prior radiograph and chronic. A small chronic appearing bone fragment in the joint space may represent meniscal calcinosis. There is a small suprapatellar effusion. The soft tissues are unremarkable. IMPRESSION: 1. No acute fracture or dislocation. 2. Postsurgical changes of prior ACL repair 3. 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 Electronically Signed   By: Anner Crete M.D.   On: 10/19/2018 20:05    Procedures Procedures (including critical care time)  Medications Ordered in ED Medications - No data to display   Initial Impression / Assessment and Plan / ED Course  I have reviewed the triage vital signs and the nursing notes.  Pertinent labs & imaging results that were available during my care of the patient were reviewed by me and considered in my medical decision making (see chart  for details).     43 y.o. female here with L knee pain after twisting it. On exam, mild swelling and TTP to medial joint line, NVI with soft compartments, no bruising, neg anterior drawer, no crepitus or deformity. Xray showing chronic postsurgical changes from ACL repair, but no acute findings. Pt able to bear some weight through leg, doubt occult tibial plateau fx. Likely meniscal injury vs ligamentous injury. Will apply knee sleeve and give crutches, advised RICE/tylenol/motrin for pain, f/up with ortho in  1wk. I explained the diagnosis and have given explicit precautions to return to the ER including for any other new or worsening symptoms. The patient understands and accepts the medical plan as it's been dictated and I have answered their questions. Discharge instructions concerning home care and prescriptions have been given. The patient is STABLE and is discharged to home in good condition.    Final Clinical Impressions(s) / ED Diagnoses   Final diagnoses:  Acute pain of left knee  Sprain of left knee, unspecified ligament, initial encounter    ED Discharge Orders    [address], [place], [place] 10/19/18 2057    Milton Ferguson, MD 10/19/18 2325

## 2018-10-19 NOTE — ED Triage Notes (Signed)
Pt reports that today around 2p twisted left knee and reports hearing a pop. Reports hx ACL tear.

## 2018-10-19 NOTE — Discharge Instructions (Signed)
Wear knee sleeve for at least 2 weeks for stabilization of knee. Use crutches as needed for comfort. Ice and elevate knee throughout the day, using ice pack for no more than 20 minutes every hour. Alternate between tylenol and ibuprofen as needed for pain relief. Call orthopedic follow up today or tomorrow to schedule followup appointment for recheck of ongoing knee pain in 1 week. Return to the ER for changes or worsening symptoms.

## 2018-10-25 ENCOUNTER — Ambulatory Visit (HOSPITAL_COMMUNITY)
Admission: RE | Admit: 2018-10-25 | Discharge: 2018-10-25 | Disposition: A | Discharge status: Home / Self Care | Payer: Medicaid Other | Source: Ambulatory Visit | Attending: Surgery | Admitting: Surgery

## 2018-10-25 ENCOUNTER — Encounter (HOSPITAL_COMMUNITY): Payer: Self-pay | Admitting: Radiology

## 2018-10-25 DIAGNOSIS — N6452 Nipple discharge: Secondary | ICD-10-CM | POA: Insufficient documentation

## 2018-10-25 MED ORDER — GADOBUTROL 1 MMOL/ML IV SOLN
7.5000 mL | Freq: Once | INTRAVENOUS | Status: AC | PRN
  Administered 2018-10-25: 8 mL via INTRAVENOUS

## 2018-10-30 ENCOUNTER — Other Ambulatory Visit: Payer: Self-pay | Admitting: Surgery

## 2018-10-30 DIAGNOSIS — R9389 Abnormal findings on diagnostic imaging of other specified body structures: Secondary | ICD-10-CM

## 2018-11-02 ENCOUNTER — Ambulatory Visit
Admission: RE | Admit: 2018-11-02 | Discharge: 2018-11-02 | Disposition: A | Discharge status: Home / Self Care | Payer: No Typology Code available for payment source | Source: Ambulatory Visit | Attending: Surgery | Admitting: Surgery

## 2018-11-02 ENCOUNTER — Ambulatory Visit: Payer: No Typology Code available for payment source

## 2018-11-02 DIAGNOSIS — R9389 Abnormal findings on diagnostic imaging of other specified body structures: Secondary | ICD-10-CM

## 2018-11-07 ENCOUNTER — Ambulatory Visit
Admission: RE | Admit: 2018-11-07 | Discharge: 2018-11-07 | Disposition: A | Discharge status: Home / Self Care | Payer: Self-pay | Source: Ambulatory Visit | Attending: Surgery | Admitting: Surgery

## 2018-11-07 DIAGNOSIS — R9389 Abnormal findings on diagnostic imaging of other specified body structures: Secondary | ICD-10-CM

## 2018-11-07 HISTORY — PX: BREAST BIOPSY: SHX20

## 2018-11-07 MED ORDER — GADOBUTROL 1 MMOL/ML IV SOLN
8.0000 mL | Freq: Once | INTRAVENOUS | Status: AC | PRN
  Administered 2018-11-07: 8 mL via INTRAVENOUS

## 2018-12-19 ENCOUNTER — Ambulatory Visit: Payer: Self-pay | Admitting: Family Medicine

## 2019-02-12 ENCOUNTER — Ambulatory Visit: Payer: Self-pay | Admitting: Physician Assistant

## 2019-02-12 NOTE — H&P (Signed)
Brittany Smith is an 43 y.o. female.   Chief Complaint: left knee pain HPI: She comes in now for her knee, complex situation with a ruptured graft and a torn meniscus with locking and catching of her knee.  She states she really can't even do housework effectively.  She has this going on from a re-injury that was three months old.  She finally bit the bullet, so to speak, to come in.    No past medical history on file.  Past Surgical History:  Procedure Laterality Date  . ANTERIOR CRUCIATE LIGAMENT REPAIR    . KNEE ARTHROSCOPY WITH ANTERIOR CRUCIATE LIGAMENT (ACL) REPAIR    . TUBAL LIGATION      No family history on file. Social History:  reports that she quit smoking about 6 years ago. She has quit using smokeless tobacco. She reports current alcohol use. She reports that she does not use drugs.  Allergies:  Allergies  Allergen Reactions  . Ceftriaxone Itching  . Tramadol Nausea And Vomiting and Other (See Comments)    drowsy    (Not in a hospital admission)   No results found for this or any previous visit (from the past 48 hour(s)). No results found.  Review of Systems  Musculoskeletal: Positive for joint pain.  All other systems reviewed and are negative.   There were no vitals taken for this visit. Physical Exam  Constitutional: She is oriented to person, place, and time. She appears well-developed and well-nourished. No distress.  HENT:  Head: Normocephalic and atraumatic.  Eyes: Pupils are equal, round, and reactive to light. Conjunctivae are normal.  Neck: Normal range of motion. Neck supple.  Cardiovascular: Normal rate and intact distal pulses.  Respiratory: Effort normal. No respiratory distress.  GI: Soft. She exhibits no distension.  Musculoskeletal:     Left knee: She exhibits decreased range of motion and swelling. Tenderness found. Lateral joint line tenderness noted.  Neurological: She is alert and oriented to person, place, and time.  Skin: Skin  is warm and dry. No rash noted. No erythema.  Psychiatric: She has a normal mood and affect. Her behavior is normal.     Assessment/Plan Again, this knee has been severely symptomatic for three months since she re-hurt it.  It is affecting her ability to even do self-care activities at home and work.  We discussed the potential problem and she and I both agree she does not want another reconstruction.  She had an allograft several years ago.  Again, she is not a candidate for repair in my mind.  Based on the severity of the symptoms I think this does merit arthroscopic left knee meniscectomy.  The risks and benefits were discussed in detail with the patient.  No contraindication.  Return to see me pending the results of surgery.  Would need to discuss where we could do physical therapy postoperatively as well.     Chriss Czar, PA-C 02/12/2019, 5:52 PM

## 2019-02-12 NOTE — H&P (View-Only) (Signed)
Brittany Smith is an 43 y.o. female.   Chief Complaint: left knee pain HPI: She comes in now for her knee, complex situation with a ruptured graft and a torn meniscus with locking and catching of her knee.  She states she really can't even do housework effectively.  She has this going on from a re-injury that was three months old.  She finally bit the bullet, so to speak, to come in.    No past medical history on file.  Past Surgical History:  Procedure Laterality Date  . ANTERIOR CRUCIATE LIGAMENT REPAIR    . KNEE ARTHROSCOPY WITH ANTERIOR CRUCIATE LIGAMENT (ACL) REPAIR    . TUBAL LIGATION      No family history on file. Social History:  reports that she quit smoking about 6 years ago. She has quit using smokeless tobacco. She reports current alcohol use. She reports that she does not use drugs.  Allergies:  Allergies  Allergen Reactions  . Ceftriaxone Itching  . Tramadol Nausea And Vomiting and Other (See Comments)    drowsy    (Not in a hospital admission)   No results found for this or any previous visit (from the past 48 hour(s)). No results found.  Review of Systems  Musculoskeletal: Positive for joint pain.  All other systems reviewed and are negative.   There were no vitals taken for this visit. Physical Exam  Constitutional: She is oriented to person, place, and time. She appears well-developed and well-nourished. No distress.  HENT:  Head: Normocephalic and atraumatic.  Eyes: Pupils are equal, round, and reactive to light. Conjunctivae are normal.  Neck: Normal range of motion. Neck supple.  Cardiovascular: Normal rate and intact distal pulses.  Respiratory: Effort normal. No respiratory distress.  GI: Soft. She exhibits no distension.  Musculoskeletal:     Left knee: She exhibits decreased range of motion and swelling. Tenderness found. Lateral joint line tenderness noted.  Neurological: She is alert and oriented to person, place, and time.  Skin: Skin  is warm and dry. No rash noted. No erythema.  Psychiatric: She has a normal mood and affect. Her behavior is normal.     Assessment/Plan Again, this knee has been severely symptomatic for three months since she re-hurt it.  It is affecting her ability to even do self-care activities at home and work.  We discussed the potential problem and she and I both agree she does not want another reconstruction.  She had an allograft several years ago.  Again, she is not a candidate for repair in my mind.  Based on the severity of the symptoms I think this does merit arthroscopic left knee meniscectomy.  The risks and benefits were discussed in detail with the patient.  No contraindication.  Return to see me pending the results of surgery.  Would need to discuss where we could do physical therapy postoperatively as well.     Chriss Czar, PA-C 02/12/2019, 5:52 PM

## 2019-02-13 ENCOUNTER — Other Ambulatory Visit: Payer: Self-pay

## 2019-02-13 ENCOUNTER — Encounter (HOSPITAL_BASED_OUTPATIENT_CLINIC_OR_DEPARTMENT_OTHER): Payer: Self-pay | Admitting: Emergency Medicine

## 2019-02-13 NOTE — Progress Notes (Signed)
SPOKE WITH: patient RIDING HOME WITH: steve [phone] AM MEDICATIONS: none NPO STATUS: npo after mn. Clears til 0430. Ensure at 0430. Npo after ensure  LABS: needs u-preg dos.  COMMENTS/CONCERNS: covid test to be done 02-19-2019  ICE,;RN, BSN.

## 2019-02-19 ENCOUNTER — Encounter (HOSPITAL_COMMUNITY)
Admission: RE | Admit: 2019-02-19 | Discharge: 2019-02-19 | Disposition: A | Discharge status: Home / Self Care | Payer: Medicaid Other | Source: Ambulatory Visit | Attending: Orthopedic Surgery | Admitting: Orthopedic Surgery

## 2019-02-19 ENCOUNTER — Other Ambulatory Visit: Payer: Self-pay

## 2019-02-19 ENCOUNTER — Other Ambulatory Visit (HOSPITAL_COMMUNITY)
Admission: RE | Admit: 2019-02-19 | Discharge: 2019-02-19 | Disposition: A | Discharge status: Home / Self Care | Payer: Medicaid Other | Source: Ambulatory Visit | Attending: Orthopedic Surgery | Admitting: Orthopedic Surgery

## 2019-02-19 DIAGNOSIS — Z1159 Encounter for screening for other viral diseases: Secondary | ICD-10-CM | POA: Insufficient documentation

## 2019-02-19 DIAGNOSIS — Z01812 Encounter for preprocedural laboratory examination: Secondary | ICD-10-CM | POA: Insufficient documentation

## 2019-02-19 LAB — CBC WITH DIFFERENTIAL/PLATELET
Abs Immature Granulocytes: 0.02 10*3/uL (ref 0.00–0.07)
Basophils Absolute: 0.1 10*3/uL (ref 0.0–0.1)
Basophils Relative: 1 %
Eosinophils Absolute: 0.5 10*3/uL (ref 0.0–0.5)
Eosinophils Relative: 6 %
HCT: 40.5 % (ref 36.0–46.0)
Hemoglobin: 13.3 g/dL (ref 12.0–15.0)
Immature Granulocytes: 0 %
Lymphocytes Relative: 29 %
Lymphs Abs: 2.3 10*3/uL (ref 0.7–4.0)
MCH: 28.4 pg (ref 26.0–34.0)
MCHC: 32.8 g/dL (ref 30.0–36.0)
MCV: 86.4 fL (ref 80.0–100.0)
Monocytes Absolute: 0.8 10*3/uL (ref 0.1–1.0)
Monocytes Relative: 10 %
Neutro Abs: 4.2 10*3/uL (ref 1.7–7.7)
Neutrophils Relative %: 54 %
Platelets: 308 10*3/uL (ref 150–400)
RBC: 4.69 MIL/uL (ref 3.87–5.11)
RDW: 15.1 % (ref 11.5–15.5)
WBC: 7.9 10*3/uL (ref 4.0–10.5)
nRBC: 0 % (ref 0.0–0.2)

## 2019-02-20 LAB — NOVEL CORONAVIRUS, NAA (HOSP ORDER, SEND-OUT TO REF LAB; TAT 18-24 HRS): SARS-CoV-2, NAA: NOT DETECTED

## 2019-02-20 NOTE — Anesthesia Preprocedure Evaluation (Addendum)
Anesthesia Evaluation  Patient identified by MRN, date of birth, ID band Patient awake    Reviewed: Allergy & Precautions, NPO status , Patient's Chart, lab work & pertinent test results  History of Anesthesia Complications Negative for: history of anesthetic complications  Airway Mallampati: II  TM Distance: >3 FB Neck ROM: Full    Dental no notable dental hx. (+) Teeth Intact, Dental Advisory Given, Chipped,    Pulmonary former smoker,    Pulmonary exam normal        Cardiovascular negative cardio ROS Normal cardiovascular exam     Neuro/Psych negative neurological ROS     GI/Hepatic negative GI ROS, Neg liver ROS,   Endo/Other  negative endocrine ROS  Renal/GU negative Renal ROS     Musculoskeletal Left lateral meniscus tear   Abdominal   Peds  Hematology negative hematology ROS (+)   Anesthesia Other Findings Day of surgery medications reviewed with the patient.  Reproductive/Obstetrics                           Anesthesia Physical Anesthesia Plan  ASA: I  Anesthesia Plan: General   Post-op Pain Management:    Induction: Intravenous  PONV Risk Score and Plan: 4 or greater and Ondansetron, Dexamethasone, Scopolamine patch - Pre-op, Treatment may vary due to age or medical condition and Midazolam  Airway Management Planned: LMA  Additional Equipment:   Intra-op Plan:   Post-operative Plan: Extubation in OR  Informed Consent: I have reviewed the patients History and Physical, chart, labs and discussed the procedure including the risks, benefits and alternatives for the proposed anesthesia with the patient or authorized representative who has indicated his/her understanding and acceptance.     Dental advisory given  Plan Discussed with: CRNA and Anesthesiologist  Anesthesia Plan Comments:      Anesthesia Quick Evaluation

## 2019-02-21 NOTE — Progress Notes (Signed)
SPOKE W/  _ [place] 19:   COUGH-- no  RUNNY NOSE--- no  SORE THROAT--- no  NASAL CONGESTION---- no  SNEEZING---- no  SHORTNESS OF BREATH--- no  DIFFICULTY BREATHING--- no  TEMP >100.0 ----- no UNEXPLAINED BODY ACHES------ no  CHILLS --------  no  HEADACHES --------- no  LOSS OF SMELL/ TASTE -------- no    HAVE YOU OR ANY FAMILY MEMBER TRAVELLED PAST 14 DAYS OUT OF THE   COUNTY--- no STATE---- no COUNTRY---- no  HAVE YOU OR ANY FAMILY MEMBER BEEN EXPOSED TO ANYONE WITH COVID 19? no

## 2019-02-22 ENCOUNTER — Other Ambulatory Visit: Payer: Self-pay

## 2019-02-22 ENCOUNTER — Ambulatory Visit (HOSPITAL_BASED_OUTPATIENT_CLINIC_OR_DEPARTMENT_OTHER): Payer: Medicaid Other | Admitting: Anesthesiology

## 2019-02-22 ENCOUNTER — Ambulatory Visit (HOSPITAL_BASED_OUTPATIENT_CLINIC_OR_DEPARTMENT_OTHER)
Admission: RE | Admit: 2019-02-22 | Discharge: 2019-02-22 | Disposition: A | Discharge status: Home / Self Care | Payer: Medicaid Other | Attending: Orthopedic Surgery | Admitting: Orthopedic Surgery

## 2019-02-22 ENCOUNTER — Encounter (HOSPITAL_BASED_OUTPATIENT_CLINIC_OR_DEPARTMENT_OTHER)
Admission: RE | Disposition: A | Discharge status: Home / Self Care | Payer: Self-pay | Source: Home / Self Care | Attending: Orthopedic Surgery

## 2019-02-22 ENCOUNTER — Ambulatory Visit (HOSPITAL_BASED_OUTPATIENT_CLINIC_OR_DEPARTMENT_OTHER): Payer: Medicaid Other | Admitting: Physician Assistant

## 2019-02-22 ENCOUNTER — Encounter (HOSPITAL_BASED_OUTPATIENT_CLINIC_OR_DEPARTMENT_OTHER): Payer: Self-pay | Admitting: Anesthesiology

## 2019-02-22 DIAGNOSIS — Z881 Allergy status to other antibiotic agents status: Secondary | ICD-10-CM | POA: Diagnosis not present

## 2019-02-22 DIAGNOSIS — M94262 Chondromalacia, left knee: Secondary | ICD-10-CM | POA: Insufficient documentation

## 2019-02-22 DIAGNOSIS — Z885 Allergy status to narcotic agent status: Secondary | ICD-10-CM | POA: Insufficient documentation

## 2019-02-22 DIAGNOSIS — X58XXXA Exposure to other specified factors, initial encounter: Secondary | ICD-10-CM | POA: Diagnosis not present

## 2019-02-22 DIAGNOSIS — Z888 Allergy status to other drugs, medicaments and biological substances status: Secondary | ICD-10-CM | POA: Diagnosis not present

## 2019-02-22 DIAGNOSIS — S83272A Complex tear of lateral meniscus, current injury, left knee, initial encounter: Secondary | ICD-10-CM | POA: Diagnosis present

## 2019-02-22 DIAGNOSIS — Z87891 Personal history of nicotine dependence: Secondary | ICD-10-CM | POA: Diagnosis not present

## 2019-02-22 HISTORY — DX: Vitamin D deficiency, unspecified: E55.9

## 2019-02-22 HISTORY — DX: Pure hypercholesterolemia, unspecified: E78.00

## 2019-02-22 HISTORY — PX: KNEE ARTHROSCOPY WITH LATERAL MENISECTOMY: SHX6193

## 2019-02-22 LAB — POCT PREGNANCY, URINE: Preg Test, Ur: NEGATIVE

## 2019-02-22 SURGERY — ARTHROSCOPY, KNEE, WITH LATERAL MENISCECTOMY
Anesthesia: General | Site: Knee | Laterality: Left

## 2019-02-22 MED ORDER — MORPHINE SULFATE (PF) 2 MG/ML IV SOLN
0.5000 mg | INTRAVENOUS | Status: DC | PRN
  Filled 2019-02-22: qty 0.5

## 2019-02-22 MED ORDER — ONDANSETRON HCL 4 MG/2ML IJ SOLN
4.0000 mg | Freq: Four times a day (QID) | INTRAMUSCULAR | Status: DC | PRN
  Filled 2019-02-22: qty 2

## 2019-02-22 MED ORDER — BUPIVACAINE-EPINEPHRINE 0.5% -1:200000 IJ SOLN
INTRAMUSCULAR | Status: DC | PRN
  Administered 2019-02-22: 30 mL

## 2019-02-22 MED ORDER — SODIUM CHLORIDE 0.9 % IR SOLN
Status: DC | PRN
  Administered 2019-02-22: 9000 mL

## 2019-02-22 MED ORDER — FENTANYL CITRATE (PF) 100 MCG/2ML IJ SOLN
INTRAMUSCULAR | Status: AC
  Filled 2019-02-22: qty 2

## 2019-02-22 MED ORDER — ACETAMINOPHEN 325 MG PO TABS
325.0000 mg | ORAL_TABLET | Freq: Four times a day (QID) | ORAL | Status: DC | PRN
  Filled 2019-02-22: qty 2

## 2019-02-22 MED ORDER — VANCOMYCIN HCL IN DEXTROSE 1-5 GM/200ML-% IV SOLN
INTRAVENOUS | Status: AC
  Filled 2019-02-22: qty 200

## 2019-02-22 MED ORDER — VANCOMYCIN HCL IN DEXTROSE 1-5 GM/200ML-% IV SOLN
1000.0000 mg | INTRAVENOUS | Status: AC
  Administered 2019-02-22: 09:00:00 1000 mg via INTRAVENOUS
  Filled 2019-02-22: qty 200

## 2019-02-22 MED ORDER — KETOROLAC TROMETHAMINE 30 MG/ML IJ SOLN
INTRAMUSCULAR | Status: DC | PRN
  Administered 2019-02-22: 30 mg via INTRAVENOUS

## 2019-02-22 MED ORDER — MIDAZOLAM HCL 2 MG/2ML IJ SOLN
INTRAMUSCULAR | Status: AC
  Filled 2019-02-22: qty 2

## 2019-02-22 MED ORDER — SORBITOL 70 % SOLN
30.0000 mL | Freq: Every day | Status: DC | PRN
  Filled 2019-02-22: qty 30

## 2019-02-22 MED ORDER — FLEET ENEMA 7-19 GM/118ML RE ENEM
1.0000 | ENEMA | Freq: Once | RECTAL | Status: DC | PRN
  Filled 2019-02-22: qty 1

## 2019-02-22 MED ORDER — HYDROCODONE-ACETAMINOPHEN 7.5-325 MG PO TABS
1.0000 | ORAL_TABLET | ORAL | Status: DC | PRN
  Filled 2019-02-22: qty 1

## 2019-02-22 MED ORDER — METOCLOPRAMIDE HCL 5 MG/ML IJ SOLN
5.0000 mg | Freq: Three times a day (TID) | INTRAMUSCULAR | Status: DC | PRN
  Filled 2019-02-22: qty 2

## 2019-02-22 MED ORDER — SCOPOLAMINE 1 MG/3DAYS TD PT72
MEDICATED_PATCH | TRANSDERMAL | Status: AC
  Filled 2019-02-22: qty 1

## 2019-02-22 MED ORDER — SODIUM CHLORIDE 0.9 % IV SOLN
INTRAVENOUS | Status: DC
  Administered 2019-02-22: 07:00:00 via INTRAVENOUS
  Filled 2019-02-22: qty 1000

## 2019-02-22 MED ORDER — LIDOCAINE 2% (20 MG/ML) 5 ML SYRINGE
INTRAMUSCULAR | Status: DC | PRN
  Administered 2019-02-22: 60 mg via INTRAVENOUS

## 2019-02-22 MED ORDER — PROPOFOL 10 MG/ML IV BOLUS
INTRAVENOUS | Status: DC | PRN
  Administered 2019-02-22: 160 mg via INTRAVENOUS
  Administered 2019-02-22: 40 mg via INTRAVENOUS

## 2019-02-22 MED ORDER — ACETAMINOPHEN 500 MG PO TABS
1000.0000 mg | ORAL_TABLET | Freq: Once | ORAL | Status: AC
  Administered 2019-02-22: 07:00:00 1000 mg via ORAL
  Filled 2019-02-22: qty 2

## 2019-02-22 MED ORDER — METOCLOPRAMIDE HCL 5 MG PO TABS
5.0000 mg | ORAL_TABLET | Freq: Three times a day (TID) | ORAL | Status: DC | PRN
  Filled 2019-02-22: qty 2

## 2019-02-22 MED ORDER — HYDROCODONE-ACETAMINOPHEN 5-325 MG PO TABS: ORAL_TABLET | ORAL | 0 refills | Status: DC

## 2019-02-22 MED ORDER — DEXAMETHASONE SODIUM PHOSPHATE 10 MG/ML IJ SOLN
INTRAMUSCULAR | Status: DC | PRN
  Administered 2019-02-22: 5 mg via INTRAVENOUS

## 2019-02-22 MED ORDER — LACTATED RINGERS IV SOLN
INTRAVENOUS | Status: DC
  Administered 2019-02-22: 08:00:00 50 mL/h via INTRAVENOUS
  Filled 2019-02-22: qty 1000

## 2019-02-22 MED ORDER — MIDAZOLAM HCL 2 MG/2ML IJ SOLN
INTRAMUSCULAR | Status: DC | PRN
  Administered 2019-02-22: 2 mg via INTRAVENOUS

## 2019-02-22 MED ORDER — KETOROLAC TROMETHAMINE 30 MG/ML IJ SOLN
INTRAMUSCULAR | Status: AC
  Filled 2019-02-22: qty 1

## 2019-02-22 MED ORDER — ONDANSETRON HCL 4 MG PO TABS
4.0000 mg | ORAL_TABLET | Freq: Four times a day (QID) | ORAL | Status: DC | PRN
  Filled 2019-02-22: qty 1

## 2019-02-22 MED ORDER — HYDROCODONE-ACETAMINOPHEN 5-325 MG PO TABS
1.0000 | ORAL_TABLET | ORAL | Status: DC | PRN
  Filled 2019-02-22: qty 2

## 2019-02-22 MED ORDER — VANCOMYCIN HCL 1000 MG IV SOLR
INTRAVENOUS | Status: DC | PRN
  Administered 2019-02-22: 09:00:00 1000 mg via INTRAVENOUS

## 2019-02-22 MED ORDER — FENTANYL CITRATE (PF) 100 MCG/2ML IJ SOLN
25.0000 ug | INTRAMUSCULAR | Status: DC | PRN
  Administered 2019-02-22 (×2): 50 ug via INTRAVENOUS
  Filled 2019-02-22: qty 1

## 2019-02-22 MED ORDER — PROMETHAZINE HCL 25 MG/ML IJ SOLN
6.2500 mg | INTRAMUSCULAR | Status: DC | PRN
  Filled 2019-02-22: qty 1

## 2019-02-22 MED ORDER — DOCUSATE SODIUM 100 MG PO CAPS
100.0000 mg | ORAL_CAPSULE | Freq: Two times a day (BID) | ORAL | Status: DC
  Filled 2019-02-22: qty 1

## 2019-02-22 MED ORDER — FENTANYL CITRATE (PF) 100 MCG/2ML IJ SOLN
INTRAMUSCULAR | Status: DC | PRN
  Administered 2019-02-22 (×6): 50 ug via INTRAVENOUS

## 2019-02-22 MED ORDER — PROPOFOL 10 MG/ML IV BOLUS
INTRAVENOUS | Status: AC
  Filled 2019-02-22: qty 20

## 2019-02-22 MED ORDER — CHLORHEXIDINE GLUCONATE 4 % EX LIQD
60.0000 mL | Freq: Once | CUTANEOUS | Status: DC
  Filled 2019-02-22: qty 118

## 2019-02-22 MED ORDER — SENNOSIDES-DOCUSATE SODIUM 8.6-50 MG PO TABS
1.0000 | ORAL_TABLET | Freq: Every evening | ORAL | Status: DC | PRN
  Filled 2019-02-22: qty 1

## 2019-02-22 MED ORDER — SODIUM CHLORIDE 0.9 % IV SOLN
INTRAVENOUS | Status: DC
  Filled 2019-02-22: qty 1000

## 2019-02-22 MED ORDER — ACETAMINOPHEN 500 MG PO TABS
ORAL_TABLET | ORAL | Status: AC
  Filled 2019-02-22: qty 2

## 2019-02-22 MED ORDER — SCOPOLAMINE 1 MG/3DAYS TD PT72
1.0000 | MEDICATED_PATCH | TRANSDERMAL | Status: DC
  Administered 2019-02-22: 09:00:00 1 via TRANSDERMAL
  Administered 2019-02-22: 07:00:00 1.5 mg via TRANSDERMAL
  Filled 2019-02-22: qty 1

## 2019-02-22 SURGICAL SUPPLY — 40 items
APL PRP STRL LF DISP 70% ISPRP (MISCELLANEOUS) ×1
BANDAGE ACE 6X5 VEL STRL LF (GAUZE/BANDAGES/DRESSINGS) ×2 IMPLANT
BANDAGE ESMARK 6X9 LF (GAUZE/BANDAGES/DRESSINGS) IMPLANT
BLADE 4.2CUDA (BLADE) ×1 IMPLANT
BNDG CMPR 9X6 STRL LF SNTH (GAUZE/BANDAGES/DRESSINGS)
BNDG ESMARK 6X9 LF (GAUZE/BANDAGES/DRESSINGS)
BNDG GAUZE ELAST 4 BULKY (GAUZE/BANDAGES/DRESSINGS) ×3 IMPLANT
CHLORAPREP W/TINT 26 (MISCELLANEOUS) ×3 IMPLANT
COVER WAND RF STERILE (DRAPES) IMPLANT
DISSECTOR 3.5MM X 13CM CVD (MISCELLANEOUS) IMPLANT
DISSECTOR 4.0MM X 13CM (MISCELLANEOUS) ×2 IMPLANT
DRAPE ARTHROSCOPY W/POUCH 90 (DRAPES) ×3 IMPLANT
DRSG EMULSION OIL 3X3 NADH (GAUZE/BANDAGES/DRESSINGS) ×3 IMPLANT
DRSG PAD ABDOMINAL 8X10 ST (GAUZE/BANDAGES/DRESSINGS) ×2 IMPLANT
EXCALIBUR 3.8MM X 13CM (MISCELLANEOUS) ×5 IMPLANT
GAUZE SPONGE 4X4 12PLY STRL (GAUZE/BANDAGES/DRESSINGS) ×3 IMPLANT
GAUZE SPONGE 4X4 12PLY STRL LF (GAUZE/BANDAGES/DRESSINGS) ×2 IMPLANT
GLOVE BIO SURGEON STRL SZ7.5 (GLOVE) ×3 IMPLANT
GLOVE BIOGEL PI IND STRL 8 (GLOVE) ×2 IMPLANT
GLOVE BIOGEL PI INDICATOR 8 (GLOVE) ×4
GLOVE SURG ORTHO 8.0 STRL STRW (GLOVE) ×3 IMPLANT
GOWN STRL REUS W/ TWL LRG LVL3 (GOWN DISPOSABLE) ×1 IMPLANT
GOWN STRL REUS W/ TWL XL LVL3 (GOWN DISPOSABLE) ×1 IMPLANT
GOWN STRL REUS W/TWL LRG LVL3 (GOWN DISPOSABLE) ×3
GOWN STRL REUS W/TWL XL LVL3 (GOWN DISPOSABLE) ×6 IMPLANT
HOLDER KNEE FOAM BLUE (MISCELLANEOUS) ×3 IMPLANT
KNEE WRAP E Z 3 GEL PACK (MISCELLANEOUS) ×3 IMPLANT
MANIFOLD NEPTUNE II (INSTRUMENTS) ×3 IMPLANT
NDL SAFETY ECLIPSE 18X1.5 (NEEDLE) ×1 IMPLANT
NEEDLE HYPO 18GX1.5 SHARP (NEEDLE) ×3
PACK ARTHROSCOPY DSU (CUSTOM PROCEDURE TRAY) ×3 IMPLANT
PACK BASIN DAY SURGERY FS (CUSTOM PROCEDURE TRAY) ×3 IMPLANT
PAD ABD 8X10 STRL (GAUZE/BANDAGES/DRESSINGS) ×1 IMPLANT
PORT APPOLLO RF 90DEGREE MULTI (SURGICAL WAND) IMPLANT
PROBE BIPOLAR ARTHRO 85MM 30D (MISCELLANEOUS) IMPLANT
SUT ETHILON 4 0 PS 2 18 (SUTURE) ×3 IMPLANT
SYR 5ML LL (SYRINGE) ×3 IMPLANT
TUBING ARTHROSCOPY IRRIG 16FT (MISCELLANEOUS) ×3 IMPLANT
WATER STERILE IRR 1000ML POUR (IV SOLUTION) ×3 IMPLANT
WRAP KNEE MAXI GEL POST OP (GAUZE/BANDAGES/DRESSINGS) IMPLANT

## 2019-02-22 NOTE — Discharge Instructions (Signed)
Diet: As you were doing prior to hospitalization   Activity: Increase activity slowly as tolerated  No lifting or driving for 48 hrs   Shower: May shower without a dressing on post op day #3, NO SOAKING in tub   Dressing: You may change your dressing on post op day #3.  Then change the dressing daily with sterile 4"x4"s gauze dressing  Or band-aids  Weight Bearing: weight bearing as tolerated  To prevent constipation: you may use a stool softener such as -  Colace ( over the counter) 100 mg by mouth twice a day  Drink plenty of fluids ( prune juice may be helpful) and high fiber foods  Miralax ( over the counter) for constipation as needed.   Precautions: If you experience chest pain or shortness of breath - call 911 immediately For transfer to the hospital emergency department!!  If you develop a fever greater that 101 F, purulent drainage from wound, increased redness or drainage from wound, or calf pain -- Call the office   Follow- Up Appointment: Please call for an appointment to be seen in 1 week  [place] - [phone]      Post Anesthesia Home Care Instructions  Activity: Get plenty of rest for the remainder of the day. A responsible individual must stay with you for 24 hours following the procedure.  For the next 24 hours, DO NOT: -Drive a car -Paediatric nurse -Drink alcoholic beverages -Take any medication unless instructed by your physician -Make any legal decisions or sign important papers.  Meals: Start with liquid foods such as gelatin or soup. Progress to regular foods as tolerated. Avoid greasy, spicy, heavy foods. If nausea and/or vomiting occur, drink only clear liquids until the nausea and/or vomiting subsides. Call your physician if vomiting continues.  Special Instructions/Symptoms: Your throat may feel dry or sore from the anesthesia or the breathing tube placed in your throat during surgery. If this causes discomfort, gargle with warm salt water.  The discomfort should disappear within 24 hours.  If you had a scopolamine patch placed behind your ear for the management of post- operative nausea and/or vomiting:  1. The medication in the patch is effective for 72 hours, after which it should be removed.  Wrap patch in a tissue and discard in the trash. Wash hands thoroughly with soap and water. 2. You may remove the patch earlier than 72 hours if you experience unpleasant side effects which may include dry mouth, dizziness or visual disturbances. 3. Avoid touching the patch. Wash your hands with soap and water after contact with the patch.

## 2019-02-22 NOTE — Anesthesia Procedure Notes (Signed)
Procedure Name: LMA Insertion Date/Time: 02/22/2019 10:00 AM Performed by: Wanita Chamberlain, CRNA Pre-anesthesia Checklist: Patient identified, Emergency Drugs available, Suction available and Patient being monitored Patient Re-evaluated:Patient Re-evaluated prior to induction Oxygen Delivery Method: Circle system utilized Preoxygenation: Pre-oxygenation with 100% oxygen Induction Type: IV induction Ventilation: Mask ventilation without difficulty LMA: LMA inserted LMA Size: 4.0 Laser Tube: Cuffed inflated with minimal occlusive pressure - saline Number of attempts: 1 Airway Equipment and Method: Bite block Placement Confirmation: breath sounds checked- equal and bilateral,  CO2 detector and positive ETCO2 Tube secured with: Tape Dental Injury: Teeth and Oropharynx as per pre-operative assessment

## 2019-02-22 NOTE — Op Note (Signed)
NAMEJOURNEE, BOBROWSKI MEDICAL RECORD [id number] ACCOUNT [id number] DATE OF BIRTH:08-30-1976 FACILITY: [facility] LOCATION: [facility] PHYSICIAN:W. Reuben Knoblock JR., MD  OPERATIVE REPORT  DATE OF PROCEDURE:  02/22/2019  PREOPERATIVE DIAGNOSES: 1.  Complex tear lateral meniscus of left knee. 2.  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  POSTOPERATIVE DIAGNOSES: 1.  Complex tear lateral meniscus of left knee. 2.  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   OPERATION: 1.  Partial lateral meniscectomy. 2.  Debridement chondroplasty patellofemoral joint and lateral compartment.  SURGEON:  Vangie Bicker, MD  ANESTHESIA:  General with local.  DESCRIPTION OF PROCEDURE:  Inferomedial and inferolateral portals were created.  Systematic inspection of the knee showed the patient had very mild chondromalacia.  Patella was debrided.  The medial compartment and medial meniscus were normal.  ACL graft  was noted to be not intact as per the MRI preoperatively.  The patient had decided preoperatively not to engage in a repeat reconstruction.  Complex tear of the lateral meniscus requiring resection of most of the posterior horn, probably about a 30-40%  meniscectomy all the way back to the capsule.  The chondral irregularity on the lateral side, tibial relatively sparing with a grade II or early grade III lesion approaching 25-30% of the weightbearing surface of the tibia and the posterior aspect  debrided.  Again, debridement chondroplasty carried out patellofemoral joint, lateral compartment, with a partial lateral meniscectomy.  Knee drained free of fluid.  Portals were closed with nylon, infiltrated intraarticularly with 30 mL as well as  subcutaneously 0.5% Marcaine.  A lightly compressive sterile dressing applied.  Taken to recovery room in stable condition.  LN/NUANCE  D:02/22/2019 T:02/22/2019 JOB:[id number]

## 2019-02-22 NOTE — Interval H&P Note (Signed)
History and Physical Interval Note:  02/22/2019 7:36 AM  Brittany Smith  has presented today for surgery, with the diagnosis of LEFT KNEE MENISCUS TEAR.  The various methods of treatment have been discussed with the patient and family. After consideration of risks, benefits and other options for treatment, the patient has consented to  Procedure(s): KNEE ARTHROSCOPY WITH LATERAL MENISECTOMY (Left) as a surgical intervention.  The patient's history has been reviewed, patient examined, no change in status, stable for surgery.  I have reviewed the patient's chart and labs.  Questions were answered to the patient's satisfaction.     [name]

## 2019-02-22 NOTE — Transfer of Care (Signed)
Immediate Anesthesia Transfer of Care Note  Patient: Brittany Smith  Procedure(s) Performed: KNEE ARTHROSCOPY WITH LATERAL MENISECTOMY (Left )  Patient Location: PACU  Anesthesia Type:General  Level of Consciousness: awake, alert , oriented and patient cooperative  Airway & Oxygen Therapy: Patient Spontanous Breathing and Patient connected to nasal cannula oxygen  Post-op Assessment: Report given to RN and Post -op Vital signs reviewed and stable  Post vital signs: Reviewed and stable  Last Vitals:  Vitals Value Taken Time  BP    Temp    Pulse    Resp    SpO2      Last Pain:  Vitals:   02/22/19 0719  TempSrc:   PainSc: 0-No pain      Patients Stated Pain Goal: 3 (86/76/19 5093)  Complications: No apparent anesthesia complications

## 2019-02-26 ENCOUNTER — Other Ambulatory Visit: Payer: Self-pay

## 2019-02-26 ENCOUNTER — Ambulatory Visit: Payer: Medicaid Other | Attending: Orthopedic Surgery | Admitting: Physical Therapy

## 2019-02-26 ENCOUNTER — Encounter: Payer: Self-pay | Admitting: Physical Therapy

## 2019-02-26 DIAGNOSIS — M25562 Pain in left knee: Secondary | ICD-10-CM | POA: Diagnosis present

## 2019-02-26 NOTE — Therapy (Signed)
[place], [place], [address] Phone: [phone]   Fax:  [phone]  Physical Therapy Evaluation  Patient Details  Name: Brittany Smith MRN: [id number] Date of Birth: July 08, 1976 Referring Provider (PT): Cafferey   Encounter Date: 02/26/2019  PT End of Session - 02/26/19 1334    Visit Number  1    Number of Visits  4    Date for PT Re-Evaluation  03/29/19    PT Start Time  8144    PT Stop Time  1400    PT Time Calculation (min)  57 min    Activity Tolerance  Patient tolerated treatment well    Behavior During Therapy  [facility] for tasks assessed/performed       Past Medical History:  Diagnosis Date  . Complication of anesthesia    they said once i woke up  during the surgery so they had to give me more medication then it took me a long time to wake up after the surgery   . Elevated cholesterol    "slightly high" but no meds recc   . Vitamin D deficiency     Past Surgical History:  Procedure Laterality Date  . ANTERIOR CRUCIATE LIGAMENT REPAIR    . BILATERAL SALPINGECTOMY    . BREAST BIOPSY Bilateral    has 4 titanium markers in place   . KNEE ARTHROSCOPY WITH ANTERIOR CRUCIATE LIGAMENT (ACL) REPAIR    . LAPAROSCOPY     " they scraped some cells "   . TUBAL LIGATION      There were no vitals filed for this visit.   Subjective Assessment - 02/26/19 1313    Subjective  Patient had a hx of left knee ACL years ago, reports that in January she was playing and hurt the left knee, tearing the lateral left meniscus.  She underwent left knee lateral meniscectomy and chondroplasty on 02/22/19.  REports that she has been very careful over the weekend    Currently in Pain?  Yes    Pain Score  4     Pain Location  Knee    Pain Orientation  Left    Pain Descriptors / Indicators  Aching    Pain Type  Acute pain;Surgical pain    Pain Onset  In the past 7 days    Pain Frequency  Constant    Aggravating Factors   walking, standing and bending pain up to 8/10    Pain Relieving Factors  rest, icde and pain meds pain can be a 1-2/10    Effect of Pain on Daily Activities  limits everything         [facility] PT Assessment - 02/26/19 0001      Assessment   Medical Diagnosis  s/p left lateral meniscectomy and chondroplasty    Referring Provider (PT)  Cafferey    Onset Date/Surgical Date  02/22/19      Precautions   Precautions  None      Balance Screen   Has the patient fallen in the past 6 months  No    Has the patient had a decrease in activity level because of a fear of falling?   No    Is the patient reluctant to leave their home because of a fear of falling?   No      Home Environment   Additional Comments  has stairs at home, does some housework      Prior Function  Level of Independence  Independent    Vocation  Full time employment    Vocation Requirements  standing at [employer], some lifting    Leisure  no exercise      Observation/Other Assessments-Edema    Edema  Circumferential      Circumferential Edema   Circumferential - Right  44cm     Circumferential - Left   47 cm      ROM / Strength   AROM / PROM / Strength  AROM;PROM;Strength      AROM   AROM Assessment Site  Knee    Right/Left Knee  Left    Left Knee Extension  18    Left Knee Flexion  94      PROM   PROM Assessment Site  Knee    Right/Left Knee  Left    Left Knee Extension  3    Left Knee Flexion  100      Strength   Overall Strength Comments  3+/5 with pain      Palpation   Palpation comment  ballotable patella, very sore and tender in the calf and knee area and into the thigh      Ambulation/Gait   Gait Comments  uses a crutch, slow, antalgic on the left, does stairs one at a time, c/o some pain                Objective measurements completed on examination: See above findings.      [facility] Adult PT Treatment/Exercise - 02/26/19 0001      Exercises   Exercises   Knee/Hip      Knee/Hip Exercises: Aerobic   Recumbent Bike  4 minutes    Nustep  4 minutes level 3             PT Education - 02/26/19 1333    Education Details  VMO activation, HS activation    Person(s) Educated  Patient    Methods  Explanation;Demonstration;Handout    Comprehension  Verbalized understanding          PT Long Term Goals - 02/26/19 1343      PT LONG TERM GOAL #1   Title  independent with HEP    Time  8    Period  Weeks    Status  New      PT LONG TERM GOAL #2   Title  increase AROM to WFL's    Time  8    Period  Weeks    Status  New      PT LONG TERM GOAL #3   Title  increase strength to 4+/5    Time  8    Period  Weeks    Status  New      PT LONG TERM GOAL #4   Title  walk without device and with no deviation    Time  8    Period  Weeks    Status  New      PT LONG TERM GOAL #5   Title  go up and down stairs step over step    Time  8    Period  Weeks    Status  New             Plan - 02/26/19 1340    Clinical Impression Statement  Patient reports injuring her knee in January.  She underwent a left lateral meniscectomy and chondroplasty on 02/22/19.  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    Stability/Clinical Decision Making  Evolving/Moderate complexity    Clinical Decision Making  Low    Rehab Potential  Good    PT Frequency  1x / week    PT Duration  8 weeks    PT Treatment/Interventions  ADLs/Self Care Home Management;Cryotherapy;Electrical Stimulation;Gait training;Stair training;Functional mobility training;Therapeutic activities;Therapeutic exercise;Balance training;Neuromuscular re-education;Patient/family education;Vasopneumatic Device    PT Next Visit Plan  slowly add exercises and work on gait and ROM    Consulted and Agree with Plan of Care  Patient       Patient will benefit from skilled therapeutic intervention  in order to improve the following deficits and impairments:  Abnormal gait, Decreased balance, Decreased endurance, Decreased mobility, Difficulty walking, Increased edema, Decreased range of motion, Decreased activity tolerance, Decreased strength, Pain  Visit Diagnosis: Acute pain of left knee - Plan: PT plan of care cert/re-cert     Problem List There are no active problems to display for this patient.   Sumner Boast., PT 02/26/2019, 1:46 PM  [place], [place], [address] Phone: [phone]   Fax:  [phone]  Name: Brittany Smith MRN: [id number] Date of Birth: 10/16/1975

## 2019-02-26 NOTE — Anesthesia Postprocedure Evaluation (Signed)
Anesthesia Post Note  Patient: Brittany Smith  Procedure(s) Performed: KNEE ARTHROSCOPY WITH LATERAL MENISECTOMY AND CHONDROPLASTY (Left Knee)     Anesthesia Post Evaluation  Last Vitals:  Vitals:   02/22/19 1200 02/22/19 1240  BP: 113/67 115/83  Pulse: 66 65  Resp: 11 12  Temp:  36.8 C  SpO2: 97% 100%    Last Pain:  Vitals:   02/26/19 1016  TempSrc:   PainSc: 2    Pain Goal: Patients Stated Pain Goal: 3 (02/22/19 1135)                 Brennan Bailey

## 2019-03-06 ENCOUNTER — Other Ambulatory Visit: Payer: Self-pay

## 2019-03-06 ENCOUNTER — Encounter: Payer: Self-pay | Admitting: Physical Therapy

## 2019-03-06 ENCOUNTER — Ambulatory Visit: Payer: Medicaid Other | Attending: Orthopedic Surgery | Admitting: Physical Therapy

## 2019-03-06 DIAGNOSIS — M25562 Pain in left knee: Secondary | ICD-10-CM | POA: Diagnosis not present

## 2019-03-06 NOTE — Therapy (Signed)
[place], [place], [address] Phone: [phone]   Fax:  [phone]  Physical Therapy Treatment  Patient Details  Name: Brittany Smith MRN: [id number] Date of Birth: 08-18-1976 Referring Provider (PT): Cafferey   Encounter Date: 03/06/2019  PT End of Session - 03/06/19 1143    Visit Number  2    Number of Visits  4    Date for PT Re-Evaluation  03/29/19    PT Start Time  1102    PT Stop Time  1144    PT Time Calculation (min)  42 min    Activity Tolerance  Patient tolerated treatment well    Behavior During Therapy  [facility] for tasks assessed/performed       Past Medical History:  Diagnosis Date  . Complication of anesthesia    they said once i woke up  during the surgery so they had to give me more medication then it took me a long time to wake up after the surgery   . Elevated cholesterol    "slightly high" but no meds recc   . Vitamin D deficiency     Past Surgical History:  Procedure Laterality Date  . ANTERIOR CRUCIATE LIGAMENT REPAIR    . BILATERAL SALPINGECTOMY    . BREAST BIOPSY Bilateral    has 4 titanium markers in place   . KNEE ARTHROSCOPY WITH ANTERIOR CRUCIATE LIGAMENT (ACL) REPAIR    . KNEE ARTHROSCOPY WITH LATERAL MENISECTOMY Left 02/22/2019   Procedure: KNEE ARTHROSCOPY WITH LATERAL MENISECTOMY AND CHONDROPLASTY;  Surgeon: Earlie Server, MD;  Location: [facility];  Service: Orthopedics;  Laterality: Left;  . LAPAROSCOPY     " they scraped some cells "   . TUBAL LIGATION      There were no vitals filed for this visit.  Subjective Assessment - 03/06/19 1108    Subjective  "Feeling pretty good today"    Currently in Pain?  No/denies    Pain Score  0-No pain                       [facility] Adult PT Treatment/Exercise - 03/06/19 0001      Knee/Hip Exercises: Aerobic   Recumbent Bike  4 minutes    Nustep  Level 4 x 53min       Knee/Hip Exercises: Machines for Strengthening   Cybex Leg Press  20lb 2x10       Knee/Hip Exercises: Standing   Other Standing Knee Exercises  LLE TKE ballmon wall x10       Knee/Hip Exercises: Seated   Long Arc Quad  Weights;10 reps;2 sets;Left    Long Arc Quad Weight  2 lbs.    Hamstring Curl  15 reps;2 sets;Left;Strengthening    Hamstring Limitations  red Tband       Knee/Hip Exercises: Supine   Quad Sets  Left;1 set;10 reps    Short Arc Quad Sets  Left;2 sets;10 reps;Strengthening    Short Arc Quad Sets Limitations  2    Straight Leg Raises  Left;10 reps;1 set    Straight Leg Raises Limitations  2                  PT Long Term Goals - 02/26/19 1343      PT LONG TERM GOAL #1   Title  independent with HEP    Time  8    Period  Weeks  Status  New      PT LONG TERM GOAL #2   Title  increase AROM to WFL's    Time  8    Period  Weeks    Status  New      PT LONG TERM GOAL #3   Title  increase strength to 4+/5    Time  8    Period  Weeks    Status  New      PT LONG TERM GOAL #4   Title  walk without device and with no deviation    Time  8    Period  Weeks    Status  New      PT LONG TERM GOAL #5   Title  go up and down stairs step over step    Time  8    Period  Weeks    Status  New            Plan - 03/06/19 1144    Clinical Impression Statement  Pt tolerated an initial progression to TE. L knee swelling remains. She had full PROM. Some weakness noted with L leg curls and extensions. She did well with leg press. Reported some pain behind her knee with ball vs wall.  LLE fatigue quick SLR. Denied modality post treatment.    Stability/Clinical Decision Making  Evolving/Moderate complexity    Rehab Potential  Good    PT Frequency  1x / week    PT Duration  8 weeks    PT Treatment/Interventions  ADLs/Self Care Home Management;Cryotherapy;Electrical Stimulation;Gait training;Stair training;Functional mobility training;Therapeutic  activities;Therapeutic exercise;Balance training;Neuromuscular re-education;Patient/family education;Vasopneumatic Device    PT Next Visit Plan  slowly add exercises and work on gait and ROM       Patient will benefit from skilled therapeutic intervention in order to improve the following deficits and impairments:  Abnormal gait, Decreased balance, Decreased endurance, Decreased mobility, Difficulty walking, Increased edema, Decreased range of motion, Decreased activity tolerance, Decreased strength, Pain  Visit Diagnosis: Acute pain of left knee     Problem List There are no active problems to display for this patient.   Scot Jun, PTA 03/06/2019, 11:48 AM  [place], [place], [address] Phone: [phone]   Fax:  [phone]  Name: Brittany Smith MRN: [id number] Date of Birth: Jan 17, 1976

## 2019-03-13 ENCOUNTER — Ambulatory Visit: Payer: Medicaid Other | Admitting: Physical Therapy

## 2019-03-13 ENCOUNTER — Encounter: Payer: Self-pay | Admitting: Physical Therapy

## 2019-03-13 ENCOUNTER — Other Ambulatory Visit: Payer: Self-pay

## 2019-03-13 DIAGNOSIS — M25562 Pain in left knee: Secondary | ICD-10-CM

## 2019-03-13 NOTE — Therapy (Signed)
[place], [place], [address] Phone: [phone]   Fax:  [phone]  Physical Therapy Treatment  Patient Details  Name: Brittany Smith MRN: [id number] Date of Birth: 06/20/1976 Referring Provider (PT): Cafferey   Encounter Date: 03/13/2019  PT End of Session - 03/13/19 1232    Visit Number  3    Date for PT Re-Evaluation  03/29/19    PT Start Time  1148    PT Stop Time  1230    PT Time Calculation (min)  42 min    Activity Tolerance  Patient tolerated treatment well    Behavior During Therapy  [facility] for tasks assessed/performed       Past Medical History:  Diagnosis Date  . Complication of anesthesia    they said once i woke up  during the surgery so they had to give me more medication then it took me a long time to wake up after the surgery   . Elevated cholesterol    "slightly high" but no meds recc   . Vitamin D deficiency     Past Surgical History:  Procedure Laterality Date  . ANTERIOR CRUCIATE LIGAMENT REPAIR    . BILATERAL SALPINGECTOMY    . BREAST BIOPSY Bilateral    has 4 titanium markers in place   . KNEE ARTHROSCOPY WITH ANTERIOR CRUCIATE LIGAMENT (ACL) REPAIR    . KNEE ARTHROSCOPY WITH LATERAL MENISECTOMY Left 02/22/2019   Procedure: KNEE ARTHROSCOPY WITH LATERAL MENISECTOMY AND CHONDROPLASTY;  Surgeon: Earlie Server, MD;  Location: [facility];  Service: Orthopedics;  Laterality: Left;  . LAPAROSCOPY     " they scraped some cells "   . TUBAL LIGATION      There were no vitals filed for this visit.  Subjective Assessment - 03/13/19 1150    Subjective  "So two days ago I got a steroid shot" Pt reports getting the shot due to swelling. Knee is good    Currently in Pain?  No/denies    Pain Score  0-No pain                       [facility] Adult PT Treatment/Exercise - 03/13/19 0001      Knee/Hip Exercises: Aerobic   Elliptical  I5 R3 x4  min     Recumbent Bike  5 minutes      Knee/Hip Exercises: Machines for Strengthening   Cybex Knee Extension  LLE 5lb 2x10     Cybex Knee Flexion  25lb x10,  LLE 15lb 2x10     Cybex Leg Press  30lb 2x10, LLE 20lb 2x10        Knee/Hip Exercises: Standing   Forward Step Up  Right;2 sets;10 reps;Hand Hold: 0;Step Height: 6"    Walking with Sports Cord  30lb 4 way x 3                   PT Long Term Goals - 03/13/19 1235      PT LONG TERM GOAL #1   Title  independent with HEP    Status  Achieved      PT LONG TERM GOAL #2   Title  increase AROM to WFL's    Status  Partially Met            Plan - 03/13/19 1232    Clinical Impression Statement  Pt reports getting a steroid shot to help with  LE inflammation. She enters clinic without wrap on LLE. Progressed to elliptical warm up and to some LE strengthening. No report of pain with today's interventions. She does walk with limp due to LLE weakness. Some compensation with step ups.     Stability/Clinical Decision Making  Evolving/Moderate complexity    Rehab Potential  Good    PT Frequency  1x / week    PT Duration  8 weeks    PT Treatment/Interventions  ADLs/Self Care Home Management;Cryotherapy;Electrical Stimulation;Gait training;Stair training;Functional mobility training;Therapeutic activities;Therapeutic exercise;Balance training;Neuromuscular re-education;Patient/family education;Vasopneumatic Device    PT Next Visit Plan  slowly add exercises and work on gait and ROM       Patient will benefit from skilled therapeutic intervention in order to improve the following deficits and impairments:  Abnormal gait, Decreased balance, Decreased endurance, Decreased mobility, Difficulty walking, Increased edema, Decreased range of motion, Decreased activity tolerance, Decreased strength, Pain  Visit Diagnosis: Acute pain of left knee     Problem List There are no active problems to display for this patient.   Scot Jun, PTA 03/13/2019, 12:35 PM  [place], [place], [address] Phone: [phone]   Fax:  [phone]  Name: Brittany Smith MRN: [id number] Date of Birth: 02/19/1976

## 2019-03-20 ENCOUNTER — Ambulatory Visit: Payer: Medicaid Other | Admitting: Physical Therapy

## 2019-03-20 ENCOUNTER — Encounter: Payer: Self-pay | Admitting: Physical Therapy

## 2019-03-20 ENCOUNTER — Other Ambulatory Visit: Payer: Self-pay

## 2019-03-20 DIAGNOSIS — M25562 Pain in left knee: Secondary | ICD-10-CM | POA: Diagnosis not present

## 2019-03-20 NOTE — Therapy (Signed)
[place], [place], [address] Phone: [phone]   Fax:  [phone]  Physical Therapy Treatment  Patient Details  Name: Brittany Smith MRN: [id number] Date of Birth: 07/17/1976 Referring Provider (PT): Cafferey   Encounter Date: 03/20/2019  PT End of Session - 03/20/19 1137    Visit Number  4    Date for PT Re-Evaluation  03/29/19    PT Start Time  1058    PT Stop Time  1138    PT Time Calculation (min)  40 min    Activity Tolerance  Patient tolerated treatment well    Behavior During Therapy  [facility] for tasks assessed/performed       Past Medical History:  Diagnosis Date  . Complication of anesthesia    they said once i woke up  during the surgery so they had to give me more medication then it took me a long time to wake up after the surgery   . Elevated cholesterol    "slightly high" but no meds recc   . Vitamin D deficiency     Past Surgical History:  Procedure Laterality Date  . ANTERIOR CRUCIATE LIGAMENT REPAIR    . BILATERAL SALPINGECTOMY    . BREAST BIOPSY Bilateral    has 4 titanium markers in place   . KNEE ARTHROSCOPY WITH ANTERIOR CRUCIATE LIGAMENT (ACL) REPAIR    . KNEE ARTHROSCOPY WITH LATERAL MENISECTOMY Left 02/22/2019   Procedure: KNEE ARTHROSCOPY WITH LATERAL MENISECTOMY AND CHONDROPLASTY;  Surgeon: Earlie Server, MD;  Location: [facility];  Service: Orthopedics;  Laterality: Left;  . LAPAROSCOPY     " they scraped some cells "   . TUBAL LIGATION      There were no vitals filed for this visit.  Subjective Assessment - 03/20/19 1100    Subjective  "Im am sore, I had my grand kids yesterday"    Currently in Pain?  No/denies    Pain Location  --   Thighs and calves   Pain Orientation  Left;Right         [facility] PT Assessment - 03/20/19 0001      PROM   Left Knee Extension  0    Left Knee Flexion  125      Strength   Overall Strength  Comments  4/5 slight pain                   [facility] Adult PT Treatment/Exercise - 03/20/19 0001      Ambulation/Gait   Stairs  Yes    Stairs Assistance  6: Modified independent (Device/Increase time)    Stair Management Technique  No rails;One rail Right;Alternating pattern    Number of Stairs  36    Height of Stairs  6      Knee/Hip Exercises: Aerobic   Elliptical  I5 R3 x4 min     Recumbent Bike  5 minutes L1       Knee/Hip Exercises: Machines for Strengthening   Cybex Knee Extension  15lb 2x10     Cybex Knee Flexion  35lb 2x10,  LLE 15lb 2x10     Cybex Leg Press  40lb 2x10      Knee/Hip Exercises: Standing   Lateral Step Up  Left;2 sets;Hand Hold: 0;Step Height: 6";10 reps                  PT Long Term Goals - 03/20/19 1122  PT LONG TERM GOAL #1   Title  independent with HEP    Status  Achieved            Plan - 03/20/19 1138    Clinical Impression Statement  Pt has progressed meeting ROM and functional goals. She reports no functional limitations at home other than the occasional L knee pain. L quad and HS has improved but still some weakness remains. This weakness does cause her to compensate with some functional movements.    Stability/Clinical Decision Making  Evolving/Moderate complexity    PT Treatment/Interventions  ADLs/Self Care Home Management;Cryotherapy;Electrical Stimulation;Gait training;Stair training;Functional mobility training;Therapeutic activities;Therapeutic exercise;Balance training;Neuromuscular re-education;Patient/family education;Vasopneumatic Device    PT Next Visit Plan  LLE functional strength       Patient will benefit from skilled therapeutic intervention in order to improve the following deficits and impairments:  Abnormal gait, Decreased balance, Decreased endurance, Decreased mobility, Difficulty walking, Increased edema, Decreased range of motion, Decreased activity tolerance, Decreased strength,  Pain  Visit Diagnosis: 1. Acute pain of left knee        Problem List There are no active problems to display for this patient.   Scot Jun, PTA 03/20/2019, 11:40 AM  [place], [place], [address] Phone: [phone]   Fax:  [phone]  Name: Brittany Smith MRN: [id number] Date of Birth: [birth year]

## 2019-04-09 ENCOUNTER — Ambulatory Visit: Payer: Medicaid Other | Attending: Orthopedic Surgery | Admitting: Physical Therapy

## 2019-04-09 ENCOUNTER — Encounter: Payer: Self-pay | Admitting: Physical Therapy

## 2019-04-09 ENCOUNTER — Other Ambulatory Visit: Payer: Self-pay

## 2019-04-09 DIAGNOSIS — M25562 Pain in left knee: Secondary | ICD-10-CM

## 2019-04-09 NOTE — Therapy (Signed)
[place], [place], [address] Phone: [phone]   Fax:  [phone]  Physical Therapy Treatment  Patient Details  Name: Gregg Holster MRN: [id number] Date of Birth: [birth year] Referring Provider (PT): Cafferey   Encounter Date: 04/09/2019  PT End of Session - 04/09/19 1229    Visit Number  5    Number of Visits  4    PT Start Time  1150    PT Stop Time  1230    PT Time Calculation (min)  40 min    Activity Tolerance  Patient tolerated treatment well    Behavior During Therapy  [facility] for tasks assessed/performed       Past Medical History:  Diagnosis Date  . Complication of anesthesia    they said once i woke up  during the surgery so they had to give me more medication then it took me a long time to wake up after the surgery   . Elevated cholesterol    "slightly high" but no meds recc   . Vitamin D deficiency     Past Surgical History:  Procedure Laterality Date  . ANTERIOR CRUCIATE LIGAMENT REPAIR    . BILATERAL SALPINGECTOMY    . BREAST BIOPSY Bilateral    has 4 titanium markers in place   . KNEE ARTHROSCOPY WITH ANTERIOR CRUCIATE LIGAMENT (ACL) REPAIR    . KNEE ARTHROSCOPY WITH LATERAL MENISECTOMY Left 02/22/2019   Procedure: KNEE ARTHROSCOPY WITH LATERAL MENISECTOMY AND CHONDROPLASTY;  Surgeon: Earlie Server, MD;  Location: [facility];  Service: Orthopedics;  Laterality: Left;  . LAPAROSCOPY     " they scraped some cells "   . TUBAL LIGATION      There were no vitals filed for this visit.  Subjective Assessment - 04/09/19 1153    Subjective  "Its good" "Don't pop as much as it use too"    Currently in Pain?  No/denies    Pain Score  0-No pain                       [facility] Adult PT Treatment/Exercise - 04/09/19 0001      Knee/Hip Exercises: Aerobic   Elliptical  I5 R3 x4 min     Recumbent Bike  5 minutes L1       Knee/Hip Exercises:  Machines for Strengthening   Cybex Knee Extension  15lb 2x10     Cybex Knee Flexion  35lb 2x10,  LLE 15lb 2x10     Cybex Leg Press  40lb 2x1, LLE 20lb 2x10       Knee/Hip Exercises: Standing   Heel Raises  Both;2 sets;15 reps;2 seconds    Lateral Step Up  Left;2 sets;Hand Hold: 0;Step Height: 6";10 reps                  PT Long Term Goals - 03/20/19 1122      PT LONG TERM GOAL #1   Title  independent with HEP    Status  Achieved            Plan - 04/09/19 1231    Clinical Impression Statement  Pt tolerated today's interventions well. Good strength and ROM with all exercises. No functional limitations reported only weakness.    Stability/Clinical Decision Making  Evolving/Moderate complexity    Rehab Potential  Good    PT Frequency  1x / week    PT Duration  8 weeks    PT Treatment/Interventions  ADLs/Self Care Home Management;Cryotherapy;Electrical Stimulation;Gait training;Stair training;Functional mobility training;Therapeutic activities;Therapeutic exercise;Balance training;Neuromuscular re-education;Patient/family education;Vasopneumatic Device    PT Next Visit Plan  LLE functonal strenght       Patient will benefit from skilled therapeutic intervention in order to improve the following deficits and impairments:  Abnormal gait, Decreased balance, Decreased endurance, Decreased mobility, Difficulty walking, Increased edema, Decreased range of motion, Decreased activity tolerance, Decreased strength, Pain  Visit Diagnosis: 1. Acute pain of left knee        Problem List There are no active problems to display for this patient.   Scot Jun 04/09/2019, 12:34 PM  [place], [place], [address] Phone: [phone]   Fax:  [phone]  Name: Kaylei Frink MRN: [id number] Date of Birth: 06-29-1976

## 2019-06-16 ENCOUNTER — Other Ambulatory Visit: Payer: Self-pay

## 2019-06-16 ENCOUNTER — Emergency Department (HOSPITAL_COMMUNITY): Payer: Medicaid Other

## 2019-06-16 ENCOUNTER — Encounter (HOSPITAL_COMMUNITY): Payer: Self-pay

## 2019-06-16 ENCOUNTER — Emergency Department (HOSPITAL_COMMUNITY)
Admission: EM | Admit: 2019-06-16 | Discharge: 2019-06-16 | Disposition: A | Discharge status: Home / Self Care | Payer: Medicaid Other | Attending: Emergency Medicine | Admitting: Emergency Medicine

## 2019-06-16 DIAGNOSIS — Z9851 Tubal ligation status: Secondary | ICD-10-CM | POA: Diagnosis not present

## 2019-06-16 DIAGNOSIS — Z79899 Other long term (current) drug therapy: Secondary | ICD-10-CM | POA: Insufficient documentation

## 2019-06-16 DIAGNOSIS — R1030 Lower abdominal pain, unspecified: Secondary | ICD-10-CM

## 2019-06-16 DIAGNOSIS — R110 Nausea: Secondary | ICD-10-CM | POA: Diagnosis not present

## 2019-06-16 DIAGNOSIS — R1032 Left lower quadrant pain: Secondary | ICD-10-CM | POA: Insufficient documentation

## 2019-06-16 DIAGNOSIS — Z87891 Personal history of nicotine dependence: Secondary | ICD-10-CM | POA: Insufficient documentation

## 2019-06-16 LAB — COMPREHENSIVE METABOLIC PANEL
ALT: 9 U/L (ref 0–44)
AST: 15 U/L (ref 15–41)
Albumin: 4.2 g/dL (ref 3.5–5.0)
Alkaline Phosphatase: 52 U/L (ref 38–126)
Anion gap: 10 (ref 5–15)
BUN: 10 mg/dL (ref 6–20)
CO2: 22 mmol/L (ref 22–32)
Calcium: 8.9 mg/dL (ref 8.9–10.3)
Chloride: 105 mmol/L (ref 98–111)
Creatinine, Ser: 0.71 mg/dL (ref 0.44–1.00)
GFR calc Af Amer: >60 mL/min (ref >60)
GFR calc non Af Amer: >60 mL/min (ref >60)
Glucose, Bld: 89 mg/dL (ref 70–99)
Potassium: 3.9 mmol/L (ref 3.5–5.1)
Sodium: 137 mmol/L (ref 135–145)
Total Bilirubin: 0.4 mg/dL (ref 0.3–1.2)
Total Protein: 6.7 g/dL (ref 6.5–8.1)

## 2019-06-16 LAB — CBC
HCT: 39.9 % (ref 36.0–46.0)
Hemoglobin: 13.2 g/dL (ref 12.0–15.0)
MCH: 28.1 pg (ref 26.0–34.0)
MCHC: 33.1 g/dL (ref 30.0–36.0)
MCV: 85.1 fL (ref 80.0–100.0)
Platelets: 295 10*3/uL (ref 150–400)
RBC: 4.69 MIL/uL (ref 3.87–5.11)
RDW: 14.7 % (ref 11.5–15.5)
WBC: 8.4 10*3/uL (ref 4.0–10.5)
nRBC: 0 % (ref 0.0–0.2)

## 2019-06-16 LAB — URINALYSIS, ROUTINE W REFLEX MICROSCOPIC
Bilirubin Urine: NEGATIVE
Glucose, UA: NEGATIVE mg/dL
Hgb urine dipstick: NEGATIVE
Ketones, ur: NEGATIVE mg/dL
Leukocytes,Ua: NEGATIVE
Nitrite: NEGATIVE
Protein, ur: NEGATIVE mg/dL
Specific Gravity, Urine: 1.013 (ref 1.005–1.030)
pH: 6 (ref 5.0–8.0)

## 2019-06-16 LAB — I-STAT BETA HCG BLOOD, ED (MC, WL, AP ONLY): I-stat hCG, quantitative: <5 m[IU]/mL (ref <5)

## 2019-06-16 LAB — LIPASE, BLOOD: Lipase: 25 U/L (ref 11–51)

## 2019-06-16 MED ORDER — SODIUM CHLORIDE 0.9% FLUSH: 3.0000 mL | Freq: Once | INTRAVENOUS | Status: DC

## 2019-06-16 MED ORDER — KETOROLAC TROMETHAMINE 15 MG/ML IJ SOLN
15.0000 mg | Freq: Once | INTRAMUSCULAR | Status: AC
  Administered 2019-06-16: 15:00:00 15 mg via INTRAVENOUS
  Filled 2019-06-16: qty 1

## 2019-06-16 MED ORDER — SODIUM CHLORIDE (PF) 0.9 % IJ SOLN
INTRAMUSCULAR | Status: AC
  Administered 2019-06-16: 15:00:00
  Filled 2019-06-16: qty 50

## 2019-06-16 MED ORDER — IOHEXOL 300 MG/ML  SOLN
100.0000 mL | Freq: Once | INTRAMUSCULAR | Status: AC | PRN
  Administered 2019-06-16: 15:00:00 100 mL via INTRAVENOUS

## 2019-06-16 MED ORDER — CYCLOBENZAPRINE HCL 10 MG PO TABS: 5.0000 mg | ORAL_TABLET | Freq: Two times a day (BID) | ORAL | 0 refills | Status: DC | PRN

## 2019-06-16 NOTE — ED Triage Notes (Signed)
Patient c/o lower abdominal pain and nausea since having a BM this AM.

## 2019-06-16 NOTE — ED Notes (Signed)
Patient ambulated to room from triage. Patient is now attempting to provide Urine Sample at this time.

## 2019-06-16 NOTE — ED Notes (Signed)
Patient transported to CT 

## 2019-06-16 NOTE — Discharge Instructions (Addendum)
Please follow up with your primary care doctor about your pain.  It is possible this is related to muscle spasms of the pelvic floor.     We did not find signs of infection on your workup, including your urine study or your CT scan of your abdomen.  You have a 16 mm cyst on your left ovary.

## 2019-06-16 NOTE — ED Provider Notes (Signed)
[facility] DEPT Provider Note   CSN: [id number] Arrival date & time: 06/16/19  1213     History   Chief Complaint Chief Complaint  Patient presents with   Abdominal Pain   Nausea    HPI Brittany Smith is a 43 y.o. female with a history of bilateral fallopian tube removal, bilateral ovaries left intact, hx of ovarian cysts, presenting to the emergency department complaining of lower abdominal pain.  She reports that for the past 2 weeks she has had episode of acute onset sharp and stabbing left lower sided and suprapubic abdominal pain.  These episodes always occur immediately after finishing with her bowel movements.  She also had a similar episode today while urinating.  She reports she has never had any episodes like this in the past.  She reports this morning at 8 AM she had a bowel movement and had a very severe episode, the pain is what brought her into the emergency department.  She still has lingering pain in the left lower side but it is improved since this morning.  The pain does not radiate anywhere.  She denies nausea or vomiting.  She denies any constipation, diarrhea, blood in her stool.  She reports is having regular bowel movements.  She denies any hematuria or dysuria.  She denies any vaginal discharge or bleeding.  She reports she has been a monogamous relationship with a single female for the past 3 years and does not think she has a sexual disease.  She denies any fevers, chills, respiratory symptoms.  She reports allergies to Rocephin and Tramadol.     HPI  Past Medical History:  Diagnosis Date   Complication of anesthesia    they said once i woke up  during the surgery so they had to give me more medication then it took me a long time to wake up after the surgery    Elevated cholesterol    "slightly high" but no meds recc    Vitamin D deficiency     There are no active problems to display for this patient.   Past  Surgical History:  Procedure Laterality Date   ANTERIOR CRUCIATE LIGAMENT REPAIR     BILATERAL SALPINGECTOMY     BREAST BIOPSY Bilateral    has 4 titanium markers in place    KNEE ARTHROSCOPY WITH ANTERIOR CRUCIATE LIGAMENT (ACL) REPAIR     KNEE ARTHROSCOPY WITH LATERAL MENISECTOMY Left 02/22/2019   Procedure: KNEE ARTHROSCOPY WITH LATERAL MENISECTOMY AND CHONDROPLASTY;  Surgeon: Earlie Server, MD;  Location: [facility];  Service: Orthopedics;  Laterality: Left;   LAPAROSCOPY     " they scraped some cells "    TUBAL LIGATION       OB History    Gravida  3   Para      Term      Preterm      AB  1   Living  2     SAB      TAB      Ectopic  1   Multiple      Live Births  2            Home Medications    Prior to Admission medications   Medication Sig Start Date End Date Taking? Authorizing Provider  loratadine (CLARITIN) 10 MG tablet Take 10 mg by mouth daily.   Yes [provider]  naproxen sodium (ANAPROX) 550 MG tablet Take 550 mg by mouth  2 (two) times daily as needed for moderate pain.  03/14/19  Yes [provider]  cyclobenzaprine (FLEXERIL) 10 MG tablet Take 0.5 tablets (5 mg total) by mouth 2 (two) times daily as needed for muscle spasms. 06/16/19   Wyvonnia Dusky, MD  HYDROcodone-acetaminophen (NORCO) 5-325 MG tablet Take 1 tab po q4-6hrs prn pain, may need 1-2 first couple days Patient not taking: Reported on 06/16/2019 02/22/19   Chadwell, Vonna Kotyk, PA-C  naproxen (NAPROSYN) 500 MG tablet Take 1 tablet (500 mg total) by mouth 2 (two) times daily with a meal. Patient not taking: Reported on 06/16/2019 04/06/18   Little, Wenda Overland, MD    Family History Family History  Problem Relation Age of Onset   Healthy Mother    Healthy Father     Social History Social History   Tobacco Use   Smoking status: Former Smoker    Types: Cigarettes    Quit date: 10/05/2012    Years since quitting: 6.6   Smokeless  tobacco: Former Systems developer  Substance Use Topics   Alcohol use: Yes    Comment: occasionally- weekends   Drug use: No     Allergies   Ceftriaxone and Tramadol   Review of Systems Review of Systems  Constitutional: Negative for chills and fever.  Respiratory: Negative for cough and shortness of breath.   Cardiovascular: Negative for chest pain and palpitations.  Gastrointestinal: Positive for abdominal pain and nausea. Negative for anal bleeding, blood in stool, constipation, diarrhea and vomiting.  Genitourinary: Negative for decreased urine volume, difficulty urinating, dysuria, flank pain, hematuria, vaginal bleeding, vaginal discharge and vaginal pain.  Neurological: Negative for seizures and syncope.  Psychiatric/Behavioral: Negative for agitation and confusion.  All other systems reviewed and are negative.    Physical Exam Updated Vital Signs BP (!) 114/58 (BP Location: Right Arm)    Pulse (!) 59    Temp 98.2 F (36.8 C) (Oral)    Resp 16    Ht 5\' 10"  (1.778 m)    Wt 81.6 kg    LMP 06/09/2019 (Approximate)    SpO2 100%    BMI 25.83 kg/m   Physical Exam Vitals signs and nursing note reviewed.  Constitutional:      General: She is not in acute distress.    Appearance: She is well-developed.  HENT:     Head: Normocephalic and atraumatic.  Eyes:     Conjunctiva/sclera: Conjunctivae normal.  Neck:     Musculoskeletal: Neck supple.  Cardiovascular:     Rate and Rhythm: Normal rate and regular rhythm.     Heart sounds: No murmur.  Pulmonary:     Effort: Pulmonary effort is normal. No respiratory distress.     Breath sounds: Normal breath sounds.  Abdominal:     Palpations: Abdomen is soft.     Tenderness: There is abdominal tenderness in the suprapubic area and left lower quadrant. There is no right CVA tenderness, left CVA tenderness or rebound. Negative signs include Murphy's sign, McBurney's sign and psoas sign.     Comments: Mild ttp of LLQ and suprapubic No  distended bladder palpable  Skin:    General: Skin is warm and dry.  Neurological:     Mental Status: She is alert.  Psychiatric:        Mood and Affect: Mood normal.        Behavior: Behavior normal.      ED Treatments / Results  Labs (all labs ordered are listed, but only abnormal results  are displayed) Labs Reviewed  LIPASE, BLOOD  COMPREHENSIVE METABOLIC PANEL  CBC  URINALYSIS, ROUTINE W REFLEX MICROSCOPIC  I-STAT BETA HCG BLOOD, ED ([facility], [facility], [facility] ONLY)    EKG None  Radiology Ct Abdomen Pelvis W Contrast  Result Date: 06/16/2019 CLINICAL DATA:  Abdominal pain with left lower quadrant tenderness. EXAM: CT ABDOMEN AND PELVIS WITH CONTRAST TECHNIQUE: Multidetector CT imaging of the abdomen and pelvis was performed using the standard protocol following bolus administration of intravenous contrast. CONTRAST:  124mL OMNIPAQUE IOHEXOL 300 MG/ML  SOLN COMPARISON:  CT scan dated 06/30/2018 FINDINGS: Lower chest: No acute abnormality. Minimal linear scarring at both lung bases. Hepatobiliary: No focal liver abnormality is seen. No gallstones, gallbladder wall thickening, or biliary dilatation. Pancreas: Unremarkable. No pancreatic ductal dilatation or surrounding inflammatory changes. Spleen: Normal in size without focal abnormality. Adrenals/Urinary Tract: Adrenal glands are normal. Kidneys are normal except for an 8 mm cyst in the lower pole of the left kidney. No hydronephrosis. Bladder is normal. Stomach/Bowel: Stomach is within normal limits. Appendix appears normal. No evidence of bowel wall thickening, distention, or inflammatory changes. Vascular/Lymphatic: No significant vascular findings are present. No enlarged abdominal or pelvic lymph nodes. Reproductive: 16 mm rim enhancing cyst in the left ovary. Tiny follicle in the right ovary. Uterus appears normal. No significant change since the prior exam. Other: Small amount of free fluid in the pelvis, normal if the patient is not  menopausal. No abdominal wall hernia. Musculoskeletal: No acute or significant osseous findings. IMPRESSION: Benign-appearing abdomen and pelvis. Electronically Signed   By: Lorriane Shire M.D.   On: 06/16/2019 15:34    Procedures Procedures (including critical care time)  Medications Ordered in ED Medications  ketorolac (TORADOL) 15 MG/ML injection 15 mg (15 mg Intravenous Given 06/16/19 1438)  iohexol (OMNIPAQUE) 300 MG/ML solution 100 mL (100 mLs Intravenous Contrast Given 06/16/19 1505)  sodium chloride (PF) 0.9 % injection (  Given by Other 06/16/19 1507)     Initial Impression / Assessment and Plan / ED Course  I have reviewed the triage vital signs and the nursing notes.  Pertinent labs & imaging results that were available during my care of the patient were reviewed by me and considered in my medical decision making (see chart for details).   Patient is a 43 year old female presenting to emergency department with pain after defecation for approximately 2 weeks.  She reports the pain is quite severe and debilitating when it occurs.  She has never had issues like this before.  She is otherwise clinically well-appearing on exam and demonstrates only mild left lower quadrant suprapubic tenderness.  Reports she has had her fallopian tubes removed continues to have bilateral ovaries.  She is a history of ovarian cyst.  However given that her symptoms are related to defecation, I have a lower suspicion for ovarian torsion or ruptured ovarian cyst at this time.  I likewise have a low suspicion for PID.  We will check her urine for signs of infection and she does have suprapubic tenderness. Also obtain a CT scan with IV contrast evaluate for diverticulitis which may explain her symptoms. Clinical Course as of Jun 16 2311  Sun Jun 16, 2019  1611 Reproductive: 16 mm rim enhancing cyst in the left ovary. Tiny follicle in the right ovary. Uterus appears normal. No significant change since the  prior exam.  Other: Small amount of free fluid in the pelvis, normal if the patient is not menopausal. No abdominal wall hernia.  Musculoskeletal:  No acute or significant osseous findings.  IMPRESSION: Benign-appearing abdomen and pelvis.   [MT]  [id number] Patient feeling somewhat better after Toradol.  Will discharge with Flexeril as a muscle relaxer, she said is possible this is related to muscle spasms of the pelvic floor.  She will follow-up with her PCP.   [MT]    Clinical Course User Index [MT] Adin Lariccia, Carola Rhine, MD   Remainder of labs and pregnancy test are pending.  Final Clinical Impressions(s) / ED Diagnoses   Final diagnoses:  Lower abdominal pain    ED Discharge Orders         Ordered    cyclobenzaprine (FLEXERIL) 10 MG tablet  2 times daily PRN     06/16/19 1728           Wyvonnia Dusky, MD 06/16/19 2312

## 2019-06-27 ENCOUNTER — Encounter (HOSPITAL_COMMUNITY): Payer: Self-pay

## 2019-06-27 ENCOUNTER — Emergency Department (HOSPITAL_COMMUNITY)
Admission: EM | Admit: 2019-06-27 | Discharge: 2019-06-27 | Disposition: A | Discharge status: Home / Self Care | Payer: Medicaid Other | Attending: Emergency Medicine | Admitting: Emergency Medicine

## 2019-06-27 ENCOUNTER — Other Ambulatory Visit: Payer: Self-pay

## 2019-06-27 DIAGNOSIS — Z5321 Procedure and treatment not carried out due to patient leaving prior to being seen by health care provider: Secondary | ICD-10-CM | POA: Diagnosis not present

## 2019-06-27 DIAGNOSIS — R110 Nausea: Secondary | ICD-10-CM | POA: Diagnosis present

## 2019-06-27 MED ORDER — SODIUM CHLORIDE 0.9% FLUSH: 3.0000 mL | Freq: Once | INTRAVENOUS | Status: DC

## 2019-06-27 NOTE — ED Notes (Signed)
CALLED FOR RE EVALUATION. UNABLE TO LOCATE PT. 2ND ATTEMPT

## 2019-06-27 NOTE — ED Notes (Signed)
CALLED FOR 3RD TIME. UNABLE TO LOCATE PT

## 2019-06-27 NOTE — ED Notes (Signed)
Called patient name in lobby to recheck vital signs no one answered

## 2019-06-27 NOTE — ED Triage Notes (Signed)
Patient reports that she had a known left ovarian cyst and felt something pop. Patient crying and stating pain is a 10/10. Patient states, "I was told it was very small and should not have hurt,but I'm in severe pain." Patient also c/o nausea.

## 2019-07-09 ENCOUNTER — Ambulatory Visit: Payer: Medicaid Other | Admitting: Obstetrics and Gynecology

## 2019-07-09 ENCOUNTER — Other Ambulatory Visit: Payer: Self-pay

## 2019-07-09 ENCOUNTER — Encounter: Payer: Self-pay | Admitting: Obstetrics and Gynecology

## 2019-07-09 DIAGNOSIS — D242 Benign neoplasm of left breast: Secondary | ICD-10-CM | POA: Insufficient documentation

## 2019-07-09 DIAGNOSIS — D241 Benign neoplasm of right breast: Secondary | ICD-10-CM | POA: Insufficient documentation

## 2019-07-09 DIAGNOSIS — N920 Excessive and frequent menstruation with regular cycle: Secondary | ICD-10-CM

## 2019-07-09 MED ORDER — TRANEXAMIC ACID 650 MG PO TABS: 1300.0000 mg | ORAL_TABLET | Freq: Three times a day (TID) | ORAL | 2 refills | Status: DC

## 2019-07-09 NOTE — Patient Instructions (Signed)
Menorrhagia Menorrhagia is when your menstrual periods are heavy or last longer than normal. Follow these instructions at home: Medicines   Take over-the-counter and prescription medicines exactly as told by your doctor. This includes iron pills.  Do not change or switch medicines without asking your doctor.  Do not take aspirin or medicines that contain aspirin 1 week before or during your period. Aspirin may make bleeding worse. General instructions  If you need to change your pad or tampon more than once every 2 hours, limit your activity until the bleeding stops.  Iron pills can cause problems when pooping (constipation). To prevent or treat pooping problems while taking prescription iron pills, your doctor may suggest that you: ? Drink enough fluid to keep your pee (urine) clear or pale yellow. ? Take over-the-counter or prescription medicines. ? Eat foods that are high in fiber. These foods include: ? Fresh fruits and vegetables. ? Whole grains. ? Beans. ? Limit foods that are high in fat and processed sugars. This includes fried and sweet foods.  Eat healthy meals and foods that are high in iron. Foods that have a lot of iron include: ? Leafy green vegetables. ? Meat. ? Liver. ? Eggs. ? Whole grain breads and cereals.  Do not try to lose weight until your heavy bleeding has stopped and you have normal amounts of iron in your blood. If you need to lose weight, work with your doctor.  Keep all follow-up visits as told by your doctor. This is important. Contact a doctor if:  You soak through a pad or tampon every 1 or 2 hours, and this happens every time you have a period.  You need to use pads and tampons at the same time because you are bleeding so much.  You are taking medicine and you: ? Feel sick to your stomach (nauseous). ? Throw up (vomit). ? Have watery poop (diarrhea).  You have other problems that may be related to the medicine you are taking. Get help  right away if:  You soak through more than a pad or tampon in 1 hour.  You pass clots bigger than 1 inch (2.5 cm) wide.  You feel short of breath.  You feel like your heart is beating too fast.  You feel dizzy or you pass out (faint).  You feel very weak or tired. Summary  Menorrhagia is when your menstrual periods are heavy or last longer than normal.  Take over-the-counter and prescription medicines exactly as told by your doctor. This includes iron pills.  Contact a doctor if you soak through more than a pad or tampon in 1 hour or are passing large clots. This information is not intended to replace advice given to you by your health care provider. Make sure you discuss any questions you have with your health care provider. Document Released: 06/28/2008 Document Revised: 12/27/2017 Document Reviewed: 10/10/2016 Elsevier Patient Education  2020 [employer] Inc[employer]  

## 2019-07-09 NOTE — Progress Notes (Signed)
Patient ID: Brittany Smith, female   DOB: 01-02-1976, 43 y.o.   MRN: [id number] Ms Erling Cruz presents for eval of heavy cycles. Pt reports heavy cycles for the last year Cycles are monthly, last 5-7 days, 4 days heavy with cramps and clots Uses pads and tampons. Interferes with ADL's and work. NSAID's help some  Last pap 9/19 normal   H/O cryo of cervix in the past  H/O bilateral breast papillomas, followed by radiology and surgery  TSVD x 2  H/O BTL  H/O ectopic pregnancy requiring surgery, knee surgery  PE AF VSS Lungs clear Heart RRR Abd soft + BS GU nl EGBUS cervix no lesions uterus small mobile non tender no masses or tenderness  A/P Menorraghia Reviewed with pt. Will check labs and GYN U/S. Start Lystedia with next cycle. U/R/B reviewed with pt. F/U after labs and U/S completed.

## 2019-07-09 NOTE — Progress Notes (Addendum)
New GYN.  Presents for AUB/Cyst.  C/o heavy bleeding, clotting and cramps 100/10 x 1+ years. She is using super Tampons every hour and soaking through her clothes, unable to sleep and perform daily functions. Had Tubal in 2005.  Last PAP 09/2018.   Last Mammogram 11/2018 found numerous Papillomas.  They did Biopsy, MRI. And she returns 10/9 for Mammo.  GAD-7=8 Declined FLU Vaccine.

## 2019-07-10 LAB — CBC
Hematocrit: 42.6 % (ref 34.0–46.6)
Hemoglobin: 13.8 g/dL (ref 11.1–15.9)
MCH: 28.7 pg (ref 26.6–33.0)
MCHC: 32.4 g/dL (ref 31.5–35.7)
MCV: 89 fL (ref 79–97)
Platelets: 328 10*3/uL (ref 150–450)
RBC: 4.81 x10E6/uL (ref 3.77–5.28)
RDW: 13.9 % (ref 11.7–15.4)
WBC: 7.9 10*3/uL (ref 3.4–10.8)

## 2019-07-10 LAB — TSH: TSH: 0.737 u[IU]/mL (ref 0.450–4.500)

## 2019-07-11 ENCOUNTER — Other Ambulatory Visit: Payer: Self-pay | Admitting: Surgery

## 2019-07-11 DIAGNOSIS — N6019 Diffuse cystic mastopathy of unspecified breast: Secondary | ICD-10-CM

## 2019-07-17 ENCOUNTER — Other Ambulatory Visit: Payer: Self-pay

## 2019-07-17 ENCOUNTER — Ambulatory Visit (HOSPITAL_COMMUNITY)
Admission: RE | Admit: 2019-07-17 | Discharge: 2019-07-17 | Disposition: A | Discharge status: Home / Self Care | Payer: Medicaid Other | Source: Ambulatory Visit | Attending: Obstetrics and Gynecology | Admitting: Obstetrics and Gynecology

## 2019-07-17 DIAGNOSIS — N920 Excessive and frequent menstruation with regular cycle: Secondary | ICD-10-CM | POA: Insufficient documentation

## 2019-07-24 ENCOUNTER — Other Ambulatory Visit: Payer: Self-pay

## 2019-07-24 ENCOUNTER — Ambulatory Visit
Admission: RE | Admit: 2019-07-24 | Discharge: 2019-07-24 | Disposition: A | Discharge status: Home / Self Care | Payer: Medicaid Other | Source: Ambulatory Visit | Attending: Surgery | Admitting: Surgery

## 2019-07-24 DIAGNOSIS — N6019 Diffuse cystic mastopathy of unspecified breast: Secondary | ICD-10-CM

## 2019-07-30 ENCOUNTER — Other Ambulatory Visit: Payer: Self-pay

## 2019-07-30 ENCOUNTER — Encounter: Payer: Self-pay | Admitting: Obstetrics and Gynecology

## 2019-07-30 ENCOUNTER — Ambulatory Visit: Payer: Medicaid Other | Admitting: Obstetrics and Gynecology

## 2019-07-30 VITALS — BP 120/80 | HR 82 | Wt 190.0 lb

## 2019-07-30 DIAGNOSIS — N920 Excessive and frequent menstruation with regular cycle: Secondary | ICD-10-CM

## 2019-07-30 MED ORDER — TRANEXAMIC ACID 650 MG PO TABS: 1300.0000 mg | ORAL_TABLET | Freq: Three times a day (TID) | ORAL | 5 refills | Status: DC

## 2019-07-30 NOTE — Progress Notes (Signed)
RGYN patient presents for Follow /Up visit to discuss U/S results from 07/17/19.  CC:  Pelvic pain on left side 4/10 that comes and goes.

## 2019-07-30 NOTE — Progress Notes (Signed)
Patient ID: Brittany Smith, female   DOB: 1976/03/09, 43 y.o.   MRN: [id number] Ms Love presents for follow up of her heavy cycles. Labs from last visit are normal GYN U/S normal as well.  Responded well to Lysteda. Last cycle, not as heavy, less cramps, but still lasted 6-7 days. Tolerated Lysteda well.  PE AF VSS Lungs clear Heart RRR Abd soft + BS  A/P Menorrhagia with regular cycle  Will continue with Lysteda. F/.U in 6 months

## 2019-12-04 ENCOUNTER — Telehealth: Payer: Self-pay

## 2019-12-04 ENCOUNTER — Other Ambulatory Visit: Payer: Self-pay

## 2019-12-04 DIAGNOSIS — N920 Excessive and frequent menstruation with regular cycle: Secondary | ICD-10-CM

## 2019-12-04 MED ORDER — TRANEXAMIC ACID 650 MG PO TABS: 1300.0000 mg | ORAL_TABLET | Freq: Three times a day (TID) | ORAL | 5 refills | Status: DC

## 2019-12-04 NOTE — Progress Notes (Signed)
Rx sent /Refilled ok per Dr.Ervin.

## 2019-12-04 NOTE — Telephone Encounter (Signed)
-----   Message from Chancy Milroy, MD sent at 12/04/2019 11:17 AM EST ----- Regarding: RE: Rx Refill Ok  You can refill and give 5 refills  Legrand Como  ----- Message ----- From: Courtney Heys, CMA Sent: 12/04/2019  10:58 AM EST To: Chancy Milroy, MD Subject: FW: Rx Refill                                  Per pt she was not able to refill Rx.  ----- Message ----- From: Chancy Milroy, MD Sent: 12/04/2019  10:09 AM EST To: Courtney Heys, CMA Subject: RE: Rx Refill                                  So does she have enough Lysteda to get her to appt?  Legrand Como  ----- Message ----- From: Courtney Heys, CMA Sent: 12/04/2019   9:28 AM EST To: Chancy Milroy, MD Subject: FW: Rx Refill                                  She has that appt scheduled on 01/22/20.  ----- Message ----- From: Chancy Milroy, MD Sent: 12/04/2019   8:25 AM EST To: Courtney Heys, CMA Subject: RE: Rx Refill                                  Original Rx had 5 refills and she was instructed to follow up in 6 months  Thanks Legrand Como  ----- Message ----- From: Courtney Heys, CMA Sent: 12/03/2019  10:48 AM EST To: Chancy Milroy, MD Subject: Rx Refill                                      TC from pt requesting refill on Lysteda. Please advise

## 2019-12-04 NOTE — Telephone Encounter (Signed)
Lysteda refilled ok per Dr.Ervin Pt made aware.

## 2019-12-21 IMAGING — MR MR BREAST BX W LOC DEV EA ADD LESION IMAGE BX SPEC MR GUIDE*L*
6 of 8 series · 33 of 48 positions shown · IV contrast (8 ml gadavist)
Comparison: Previous exams.

Addendum:
CLINICAL DATA: Patient with history of right breast papillomas. On
recent breast MRI there are multiple masses identified in both the
right and left breast. Patient for MRI guided core needle biopsy of
2 left breast masses.

EXAM:
MRI GUIDED CORE NEEDLE BIOPSY OF THE LEFT BREAST
TECHNIQUE: Multiplanar, multisequence MR imaging of the left breast was
performed both before and after administration of intravenous
contrast.
CONTRAST:  8 mm Gadavist

[Series 5: fiducial unilateral · sagittal · 2.0mm · 1.33mm/px · 1 of 52 slices shown]
[im 1/52]
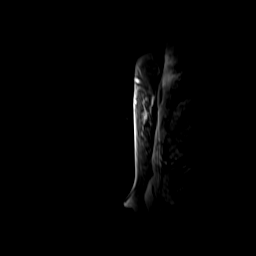

[Series 6: dynamic pre · axial · non-contrast · 1.3mm · 0.73mm/px · z∈[-29,+177]mm · 6 of 160 slices shown]
[im 1/160]
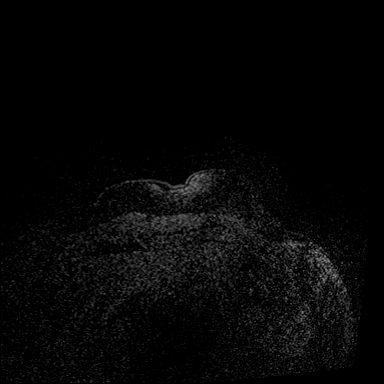
[im 32/160]
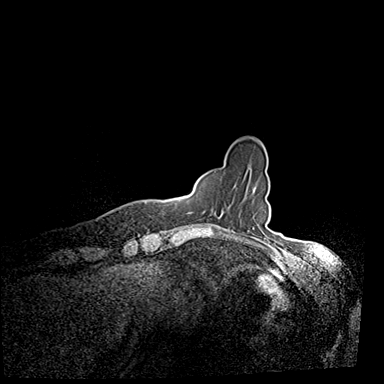
[im 64/160]
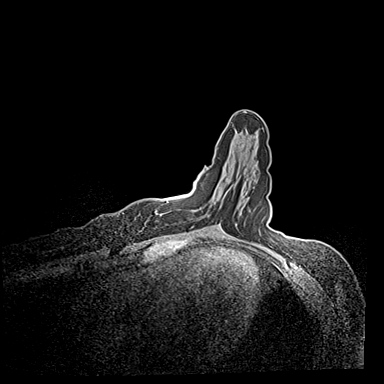
[im 96/160]
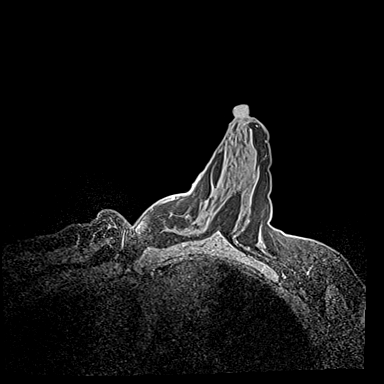
[im 128/160]
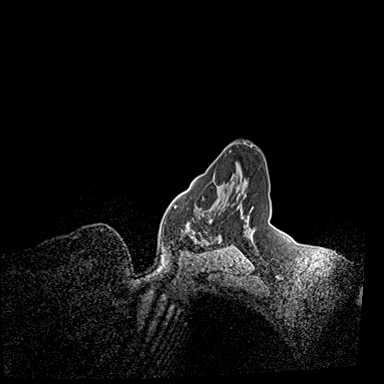
[im 160/160]
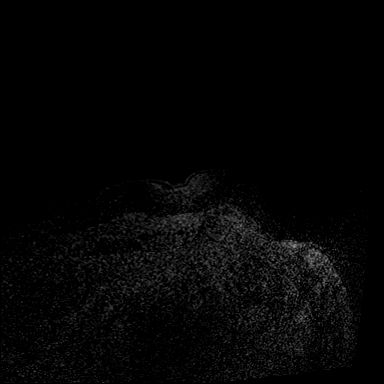

[Series 7: dynamic post 20 · axial · 1.3mm · 0.73mm/px · z∈[-29,+177]mm · 6 of 160 slices shown (1 of 2)]
[im 1/160]
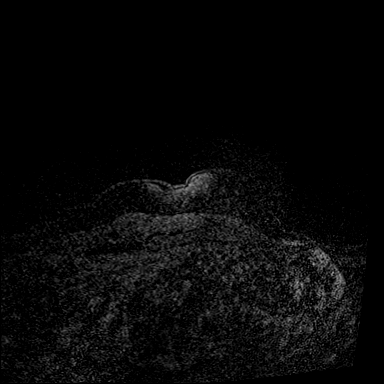
[im 32/160]
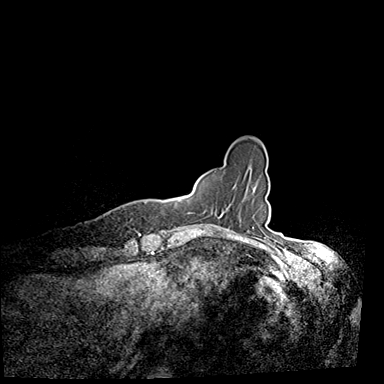
[im 64/160]
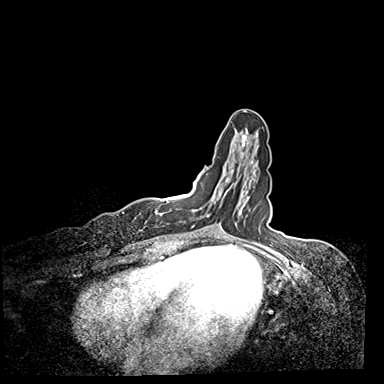
[im 96/160]
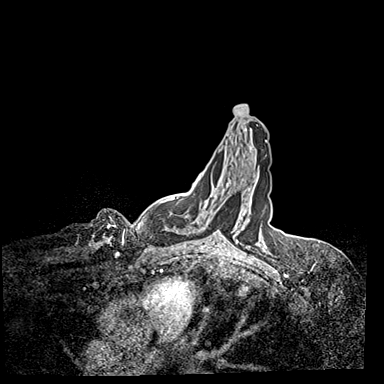
[im 128/160]
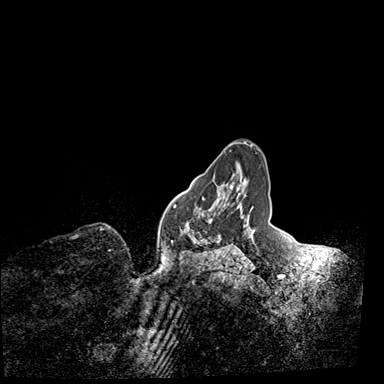
[im 160/160]
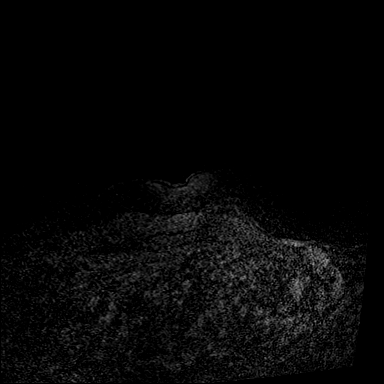

[Series 8: dynamic post 20 · axial · 1.3mm · 0.73mm/px · z∈[-29,+177]mm · 7 of 160 slices shown (2 of 2)]
[im 1/160]
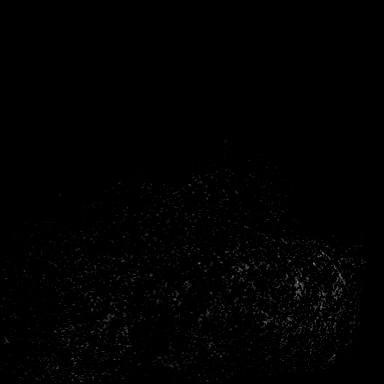
[im 27/160]
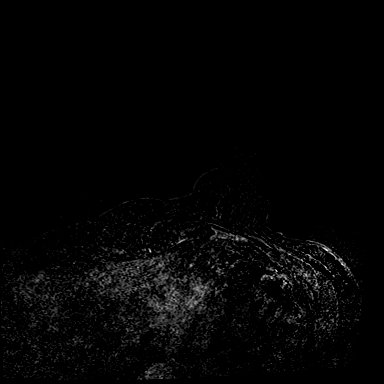
[im 54/160]
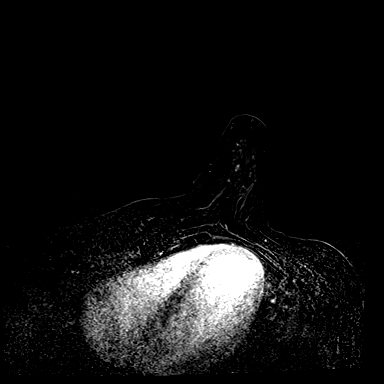
[im 80/160]
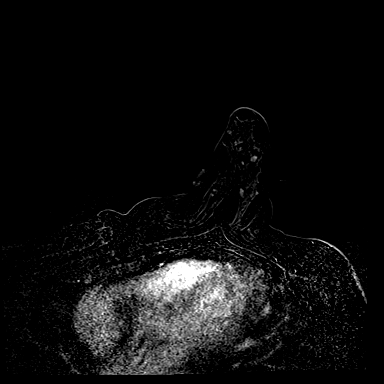
[im 107/160]
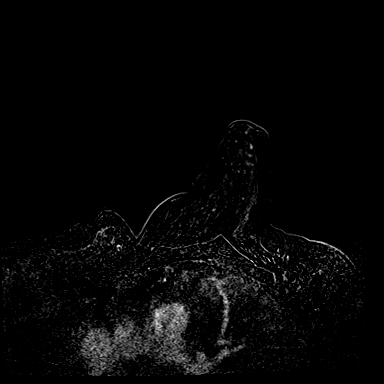
[im 133/160]
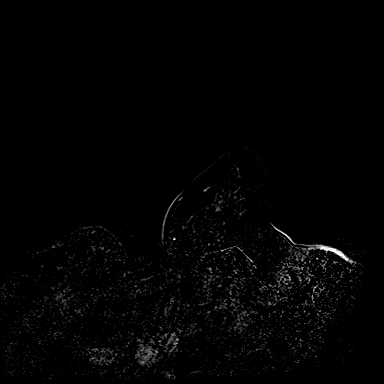
[im 160/160]
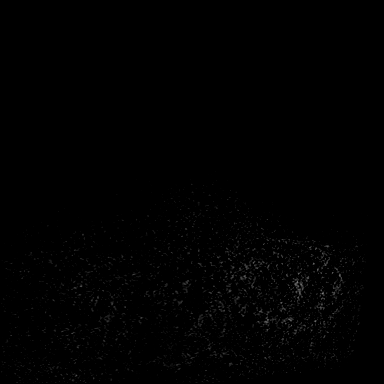

[Series 9: dynamic post 3 · axial · 1.3mm · 0.73mm/px · z∈[-29,+177]mm · 7 of 160 slices shown (1 of 2)]
[im 1/160]
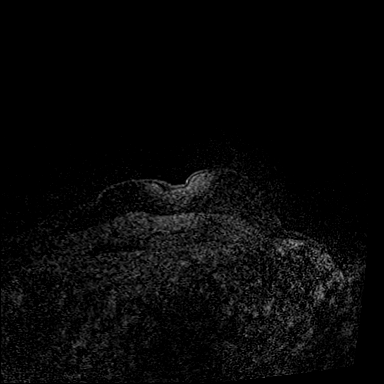
[im 27/160]
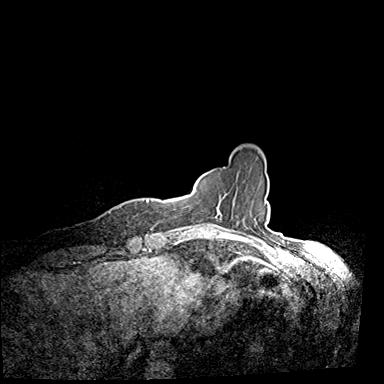
[im 54/160]
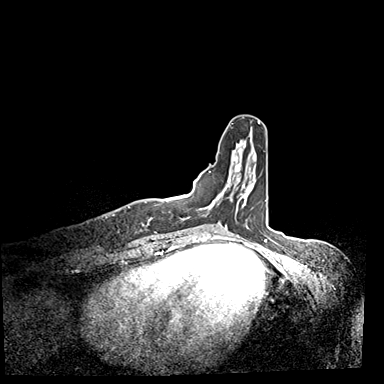
[im 80/160]
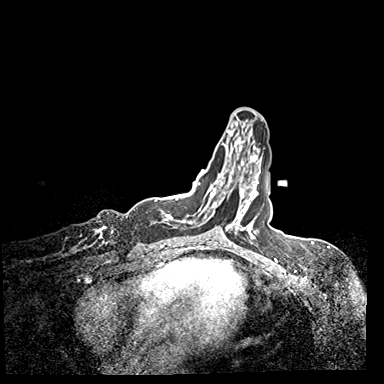
[im 107/160]
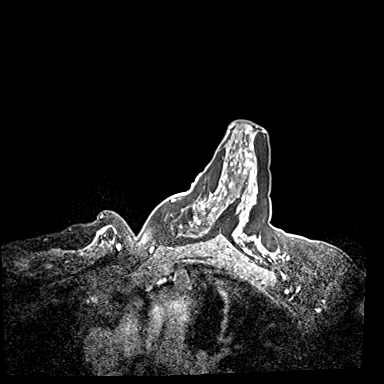
[im 133/160]
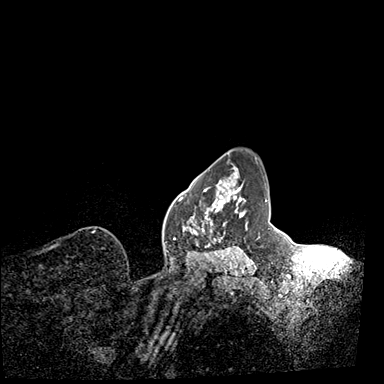
[im 160/160]
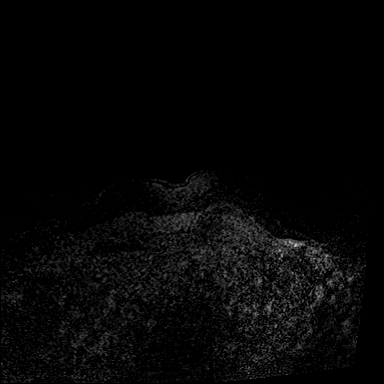

[Series 10: dynamic post 3 · axial · 1.3mm · 0.73mm/px · z∈[-29,+142]mm · 6 of 160 slices shown (2 of 2)]
[im 1/160]
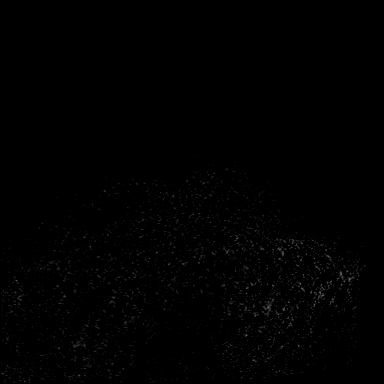
[im 27/160]
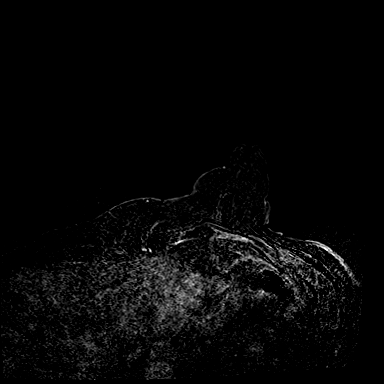
[im 54/160]
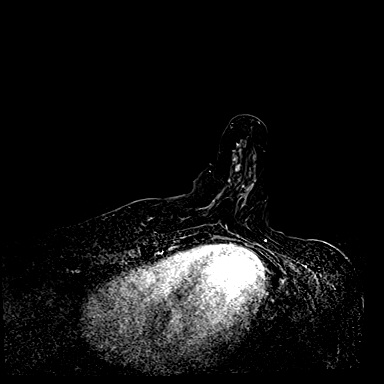
[im 80/160]
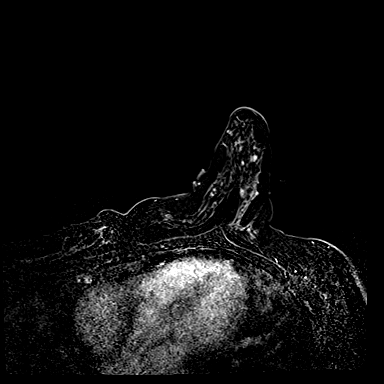
[im 107/160]
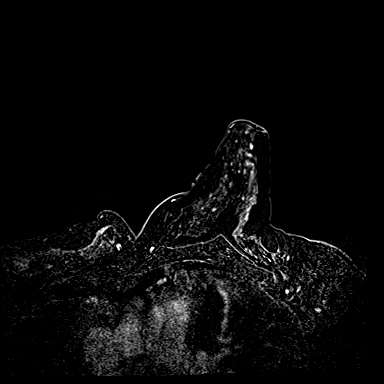
[im 133/160]
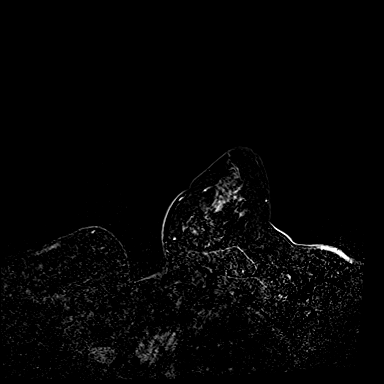

[33 of 48 positions shown; findings below may reference images not displayed]

FINDINGS: I met with the patient, and we discussed the procedure of MRI guided
biopsy, including risks, benefits, and alternatives. Specifically,
we discussed the risks of infection, bleeding, tissue injury, clip
migration, and inadequate sampling. Informed, written consent was
given. The usual time out protocol was performed immediately prior
to the procedure.

Site 1: Upper-outer left breast: Cylinder clip.

Using sterile technique, 1% Lidocaine, MRI guidance, and a 9 gauge
vacuum assisted device, biopsy was performed of enhancing mass
within the upper-outer left breast using a lateral approach. At the
conclusion of the procedure, a cylinder-shaped tissue marker clip
was deployed into the biopsy cavity. Follow-up 2-view mammogram was
performed and dictated separately.

Site 2: Lower outer left breast: Barbell shaped clip.

Using sterile technique, 1% Lidocaine, MRI guidance, and a 9 gauge
vacuum assisted device, biopsy was performed of enhancing mass
within the lower outer left breast using a lateral approach. At the
conclusion of the procedure, a barbell shaped tissue marker clip was
deployed into the biopsy cavity. Follow-up 2-view mammogram was
performed and dictated separately.
IMPRESSION: MRI guided biopsy of left breast masses, upper outer and lower outer
quadrants. No apparent complications.

ADDENDUM:
Pathology revealed INTRADUCTAL PAPILLOMA(S) of the Left breast, both
locations, upper outer and lower outer. This was found to be
concordant by Dr. Hiroshi Tesfaye. Recommend surgical consultation for
discussion of management options. Patient has findings suggestive of
extensive bilateral papillomatosis with innumerable masses
throughout the right and left breast on prior MRI.

Pathology results were discussed with the patient by telephone. The
patient reported doing well after the biopsy with tenderness at the
site. Post biopsy instructions and care were reviewed and questions
were answered. The patient was encouraged to call The [REDACTED]

Surgical consultation has been arranged with Dr. Orisvaldo Lehmkuhl at
[REDACTED] on November 16, 2018.

Pathology results reported by Jeydrik Millan Bones, RN on 11/09/2018.

*** End of Addendum ***

## 2020-01-22 ENCOUNTER — Other Ambulatory Visit: Payer: Self-pay

## 2020-01-22 ENCOUNTER — Ambulatory Visit: Payer: Medicaid Other | Admitting: Obstetrics and Gynecology

## 2020-01-22 ENCOUNTER — Encounter: Payer: Self-pay | Admitting: Obstetrics and Gynecology

## 2020-01-22 VITALS — BP 122/70 | HR 91 | Wt 184.0 lb

## 2020-01-22 DIAGNOSIS — N920 Excessive and frequent menstruation with regular cycle: Secondary | ICD-10-CM | POA: Diagnosis not present

## 2020-01-22 NOTE — Progress Notes (Signed)
Pt is in office for follow up.  Pt states bleeding has not changed much with use of Lysteda.  Pt states she is still cramping 2-3 days before cycle, cycle last about 6-7 days and she is passing clots.   Pt would like to discuss next step.

## 2020-01-22 NOTE — Progress Notes (Signed)
Patient ID: Brittany Smith, female   DOB: 01-Apr-1976, 44 y.o.   MRN: [id number] Ms Brittany Smith is here for follow up/ Marshell Levan is not helping as much as in the past. Cycles are still monthly but painful cramps and still lasting 6-7 days. She desires definite therapy.  PE AF VSS Lungs clear Heart RRR Abd soft + BS  A/P Menorrhagia  As noted above. Pt desires definite therapy. TVH reviewed with pt. R/B/Post op care reviewed. Desires to proceed. Information provided to pt. Hysterectomy papers signed today. Follow up with post op appt.

## 2020-01-22 NOTE — Patient Instructions (Signed)
Vaginal Hysterectomy, Care After Refer to this sheet in the next few weeks. These instructions provide you with information about caring for yourself after your procedure. Your health care provider may also give you more specific instructions. Your treatment has been planned according to current medical practices, but problems sometimes occur. Call your health care provider if you have any problems or questions after your procedure. What can I expect after the procedure? After the procedure, it is common to have:  Pain.  Soreness and numbness in your incision areas.  Vaginal bleeding and discharge.  Constipation.  Temporary problems emptying the bladder.  Feelings of sadness or other emotions. Follow these instructions at home: Medicines  Take over-the-counter and prescription medicines only as told by your health care provider.  If you were prescribed an antibiotic medicine, take it as told by your health care provider. Do not stop taking the antibiotic even if you start to feel better.  Do not drive or operate heavy machinery while taking prescription pain medicine. Activity  Return to your normal activities as told by your health care provider. Ask your health care provider what activities are safe for you.  Get regular exercise as told by your health care provider. You may be told to take short walks every day and go farther each time.  Do not lift anything that is heavier than 10 lb (4.5 kg). General instructions   Do not put anything in your vagina for 6 weeks after your surgery or as told by your health care provider. This includes tampons and douches.  Do not have sex until your health care provider says you can.  Do not take baths, swim, or use a hot tub until your health care provider approves.  Drink enough fluid to keep your urine clear or pale yellow.  Do not drive for 24 hours if you were given a sedative.  Keep all follow-up visits as told by your health  care provider. This is important. Contact a health care provider if:  Your pain medicine is not helping.  You have a fever.  You have redness, swelling, or pain at your incision site.  You have blood, pus, or a bad-smelling discharge from your vagina.  You continue to have difficulty urinating. Get help right away if:  You have severe abdominal or back pain.  You have heavy bleeding from your vagina.  You have chest pain or shortness of breath. This information is not intended to replace advice given to you by your health care provider. Make sure you discuss any questions you have with your health care provider. Document Revised: 05/12/2016 Document Reviewed: 10/04/2015 Elsevier Patient Education  2020 [place]. Vaginal Hysterectomy  A vaginal hysterectomy is a procedure to remove all or part of the uterus through a small incision in the vagina. In this procedure, your health care provider may remove your entire uterus, including the lower end (cervix). You may need a vaginal hysterectomy to treat:  Uterine fibroids.  A condition that causes the lining of the uterus to grow in other areas (endometriosis).  Problems with pelvic support.  Cancer of the cervix, ovaries, uterus, or tissue that lines the uterus (endometrium).  Excessive (dysfunctional) uterine bleeding. When removing your uterus, your health care provider may also remove the organs that produce eggs (ovaries) and the tubes that carry eggs to your uterus (fallopian tubes). After a vaginal hysterectomy, you will no longer be able to have a baby. You will also no longer get your  menstrual period. Tell a health care provider about:  Any allergies you have.  All medicines you are taking, including vitamins, herbs, eye drops, creams, and over-the-counter medicines.  Any problems you or family members have had with anesthetic medicines.  Any blood disorders you have.  Any surgeries you have had.  Any medical  conditions you have.  Whether you are pregnant or may be pregnant. What are the risks? Generally, this is a safe procedure. However, problems may occur, including:  Bleeding.  Infection.  A blood clot that forms in your leg and travels to your lungs (pulmonary embolism).  Damage to surrounding organs.  Pain during sex. What happens before the procedure?  Ask your health care provider what organs will be removed during surgery.  Ask your health care provider about: ? Changing or stopping your regular medicines. This is especially important if you are taking diabetes medicines or blood thinners. ? Taking medicines such as aspirin and ibuprofen. These medicines can thin your blood. Do not take these medicines before your procedure if your health care provider instructs you not to.  Follow instructions from your health care provider about eating or drinking restrictions.  Do not use any tobacco products, such as cigarettes, chewing tobacco, and e-cigarettes. If you need help quitting, ask your health care provider.  Plan to have someone take you home after discharge from the hospital. What happens during the procedure?  To reduce your risk of infection: ? Your health care team will wash or sanitize their hands. ? Your skin will be washed with soap.  An IV tube will be inserted into one of your veins.  You may be given antibiotic medicine to help prevent infection.  You will be given one or more of the following: ? A medicine to help you relax (sedative). ? A medicine to numb the area (local anesthetic). ? A medicine to make you fall asleep (general anesthetic). ? A medicine that is injected into an area of your body to numb everything beyond the injection site (regional anesthetic).  Your surgeon will make an incision in your vagina.  Your surgeon will locate and remove all or part of your uterus.  Your ovaries and fallopian tubes may be removed at the same time.  The  incision will be closed with stitches (sutures) that dissolve over time. The procedure may vary among health care providers and hospitals. What happens after the procedure?  Your blood pressure, heart rate, breathing rate, and blood oxygen level will be monitored often until the medicines you were given have worn off.  You will be encouraged to get up and walk around after a few hours to help prevent complications.  You may have IV tubes in place for a few days.  You will be given pain medicine as needed.  Do not drive for 24 hours if you were given a sedative. This information is not intended to replace advice given to you by your health care provider. Make sure you discuss any questions you have with your health care provider. Document Revised: 05/12/2016 Document Reviewed: 10/04/2015 Elsevier Patient Education  2020 [employer].

## 2020-02-18 NOTE — Progress Notes (Signed)
[place], [place] [place] [address] Phone: [phone] Fax: [phone]  [facility] [place], [address] [place] [address] Phone: [phone] Fax: [phone]    Your procedure is scheduled on Tuesday, May 25th.  Report to [facility] Main Entrance "A" at 9:10 A.M., and check in at the Admitting office.  Call this number if you have problems the morning of surgery:  [phone]  Call [phone] if you have any questions prior to your surgery date Monday-Friday 8am-4pm   Remember:  Do not eat after midnight the night before your surgery  You may drink clear liquids until 8:10 A.M. the morning of your surgery.   Clear liquids allowed are: Water, Non-Citrus Juices (without pulp), Carbonated Beverages, Clear Tea, Black Coffee Only, and Gatorade    Take these medicines the morning of surgery with A SIP OF WATER  fexofenadine (ALLEGRA)   As of today, STOP taking any Aspirin (unless otherwise instructed by your surgeon) and Aspirin containing products, Aleve, Naproxen, Ibuprofen, Motrin, Advil, Goody's, BC's, all herbal medications, fish oil, and all vitamins.             Do not wear jewelry, make up, or nail polish            Do not wear lotions, powders, perfumes,or deodorant.            Do not shave 48 hours prior to surgery.             Do not bring valuables to the hospital.            [facility] is not responsible for any belongings or valuables.  Do NOT Smoke (Tobacco/Vapping) or drink Alcohol 24 hours prior to your procedure If you use a CPAP at night, you may bring all equipment for your overnight stay.   Contacts, glasses, dentures or bridgework may not be worn into surgery.      For patients admitted to the hospital, discharge time will be determined by your treatment team.   Patients discharged the day of surgery  will not be allowed to drive home, and someone needs to stay with them for 24 hours.  Special instructions:   [facility]- Preparing For Surgery  Before surgery, you can play an important role. Because skin is not sterile, your skin needs to be as free of germs as possible. You can reduce the number of germs on your skin by washing with CHG (chlorahexidine gluconate) Soap before surgery.  CHG is an antiseptic cleaner which kills germs and bonds with the skin to continue killing germs even after washing.    Oral Hygiene is also important to reduce your risk of infection.  Remember - BRUSH YOUR TEETH THE MORNING OF SURGERY WITH YOUR REGULAR TOOTHPASTE  Please do not use if you have an allergy to CHG or antibacterial soaps. If your skin becomes reddened/irritated stop using the CHG.  Do not shave (including legs and underarms) for at least 48 hours prior to first CHG shower. It is OK to shave your face.  Please follow these instructions carefully.   1. Shower the NIGHT BEFORE SURGERY and the MORNING OF SURGERY with CHG Soap.   2. If you chose to wash your hair, wash your hair first as usual with your normal shampoo.  3. After you shampoo, rinse your  hair and body thoroughly to remove the shampoo.  4. Use CHG as you would any other liquid soap. You can apply CHG directly to the skin and wash gently with a scrungie or a clean washcloth.   5. Apply the CHG Soap to your body ONLY FROM THE NECK DOWN.  Do not use on open wounds or open sores. Avoid contact with your eyes, ears, mouth and genitals (private parts). Wash Face and genitals (private parts)  with your normal soap.   6. Wash thoroughly, paying special attention to the area where your surgery will be performed.  7. Thoroughly rinse your body with warm water from the neck down.  8. DO NOT shower/wash with your normal soap after using and rinsing off the CHG Soap.  9. Pat yourself dry with a CLEAN TOWEL.  10. Wear CLEAN PAJAMAS to bed  the night before surgery, wear comfortable clothes the morning of surgery  11. Place CLEAN SHEETS on your bed the night of your first shower and DO NOT SLEEP WITH PETS.  Day of Surgery: Shower with CHG soap as instructed above.  Do not apply any deodorants/lotions.  Please wear clean clothes to the hospital/surgery center.   Remember to brush your teeth WITH YOUR REGULAR TOOTHPASTE.   Please read over the following fact sheets that you were given.

## 2020-02-19 ENCOUNTER — Encounter (HOSPITAL_COMMUNITY): Payer: Self-pay

## 2020-02-19 ENCOUNTER — Other Ambulatory Visit: Payer: Self-pay

## 2020-02-19 ENCOUNTER — Encounter (HOSPITAL_COMMUNITY)
Admission: RE | Admit: 2020-02-19 | Discharge: 2020-02-19 | Disposition: A | Discharge status: Home / Self Care | Payer: Medicaid Other | Source: Ambulatory Visit | Attending: Obstetrics and Gynecology | Admitting: Obstetrics and Gynecology

## 2020-02-19 DIAGNOSIS — Z01812 Encounter for preprocedural laboratory examination: Secondary | ICD-10-CM | POA: Insufficient documentation

## 2020-02-19 LAB — BASIC METABOLIC PANEL
Anion gap: 12 (ref 5–15)
BUN: 5 mg/dL — ABNORMAL LOW (ref 6–20)
CO2: 24 mmol/L (ref 22–32)
Calcium: 9.9 mg/dL (ref 8.9–10.3)
Chloride: 102 mmol/L (ref 98–111)
Creatinine, Ser: 0.8 mg/dL (ref 0.44–1.00)
GFR calc Af Amer: >60 mL/min (ref >60)
GFR calc non Af Amer: >60 mL/min (ref >60)
Glucose, Bld: 110 mg/dL — ABNORMAL HIGH (ref 70–99)
Potassium: 3.9 mmol/L (ref 3.5–5.1)
Sodium: 138 mmol/L (ref 135–145)

## 2020-02-19 LAB — CBC
HCT: 47.9 % — ABNORMAL HIGH (ref 36.0–46.0)
Hemoglobin: 15.9 g/dL — ABNORMAL HIGH (ref 12.0–15.0)
MCH: 28.4 pg (ref 26.0–34.0)
MCHC: 33.2 g/dL (ref 30.0–36.0)
MCV: 85.5 fL (ref 80.0–100.0)
Platelets: 341 10*3/uL (ref 150–400)
RBC: 5.6 MIL/uL — ABNORMAL HIGH (ref 3.87–5.11)
RDW: 14.6 % (ref 11.5–15.5)
WBC: 9.5 10*3/uL (ref 4.0–10.5)
nRBC: 0 % (ref 0.0–0.2)

## 2020-02-19 LAB — ABO/RH: ABO/RH(D): B POS

## 2020-02-19 NOTE — Progress Notes (Signed)
PCP -  Cardiologist -   PPM/ICD - denies  Chest x-ray - N/A  EKG - N/A Stress Test - denies  ECHO - denies Cardiac Cath - denies   Sleep Study - denies CPAP - N/A  DM: denies  Blood Thinner Instructions: N/A Aspirin Instructions: N/A  ERAS Protcol - Yes PRE-SURGERY Ensure or G2- None ordered  COVID TEST- Scheduled for 02/22/2020. Patient verbalized understanding of self-quaranitine instructions, appointment time and place.  Anesthesia review: No  Patient denies shortness of breath, fever, cough and chest pain at PAT appointment  All instructions explained to the patient, with a verbal understanding of the material. Patient agrees to go over the instructions while at home for a better understanding. Patient also instructed to self quarantine after being tested for COVID-19. The opportunity to ask questions was provided.

## 2020-02-20 ENCOUNTER — Telehealth: Payer: Self-pay

## 2020-02-20 NOTE — Telephone Encounter (Signed)
Returned call, and answered questions about surgery

## 2020-02-22 ENCOUNTER — Other Ambulatory Visit (HOSPITAL_COMMUNITY)
Admission: RE | Admit: 2020-02-22 | Discharge: 2020-02-22 | Disposition: A | Discharge status: Home / Self Care | Payer: Medicaid Other | Source: Ambulatory Visit | Attending: Obstetrics and Gynecology | Admitting: Obstetrics and Gynecology

## 2020-02-22 DIAGNOSIS — Z01812 Encounter for preprocedural laboratory examination: Secondary | ICD-10-CM | POA: Insufficient documentation

## 2020-02-22 DIAGNOSIS — Z20822 Contact with and (suspected) exposure to covid-19: Secondary | ICD-10-CM | POA: Diagnosis not present

## 2020-02-22 LAB — SARS CORONAVIRUS 2 (TAT 6-24 HRS): SARS Coronavirus 2: NEGATIVE

## 2020-02-23 NOTE — H&P (Signed)
Brittany Smith is an 44 y.o. female with H/O menorrhagia which has become refactory to medical therapy. She has had a normal GYN U/S. Cycles are monthly, heavy with cramps and last 6-7 days She desires definite therapy.   TSVD x 2  H/O BTL H/O ectopic pregnancy with surgery H/O knee surgery  Menstrual History: Menarche age: 86 No LMP recorded.    Past Medical History:  Diagnosis Date  . Anxiety   . Arthritis   . Elevated cholesterol    "slightly high" but no meds recc   . Pregnancy induced hypertension   . Vaginal Pap smear, abnormal   . Vitamin D deficiency     Past Surgical History:  Procedure Laterality Date  . ANTERIOR CRUCIATE LIGAMENT REPAIR    . BILATERAL SALPINGECTOMY    . BREAST BIOPSY Right 09/28/2018   x2  . BREAST BIOPSY Left 11/07/2018   x2  . KNEE ARTHROSCOPY WITH ANTERIOR CRUCIATE LIGAMENT (ACL) REPAIR    . KNEE ARTHROSCOPY WITH LATERAL MENISECTOMY Left 02/22/2019   Procedure: KNEE ARTHROSCOPY WITH LATERAL MENISECTOMY AND CHONDROPLASTY;  Surgeon: Earlie Server, MD;  Location: [facility];  Service: Orthopedics;  Laterality: Left;  . LAPAROSCOPY     " they scraped some cells "   . TUBAL LIGATION      Family History  Problem Relation Age of Onset  . Healthy Mother   . Healthy Father     Social History:  reports that she quit smoking about 7 years ago. Her smoking use included cigarettes. She has never used smokeless tobacco. She reports current alcohol use. She reports that she does not use drugs.  Allergies:  Allergies  Allergen Reactions  . Ceftriaxone Itching  . Tramadol Nausea And Vomiting and Other (See Comments)    Drowsy     No medications prior to admission.    Review of Systems  Constitutional: Negative.   Respiratory: Negative.   Cardiovascular: Negative.   Gastrointestinal: Negative.   Genitourinary: Negative.     There were no vitals taken for this visit. Physical Exam  Constitutional: She appears  well-developed and well-nourished.  Cardiovascular: Normal rate and regular rhythm.  Respiratory: Effort normal and breath sounds normal.  GI: Soft. Bowel sounds are normal.  Genitourinary:    Genitourinary Comments: Nl EGBUS, uterus < 10 week size, mobile, non tender, no adnexal masses     Results for orders placed or performed during the hospital encounter of 02/22/20 (from the past 24 hour(s))  SARS CORONAVIRUS 2 (TAT 6-24 HRS) Nasopharyngeal Nasopharyngeal Swab     Status: None   Collection Time: 02/22/20 10:05 AM   Specimen: Nasopharyngeal Swab  Result Value Ref Range   SARS Coronavirus 2 NEGATIVE NEGATIVE    No results found.  Assessment/Plan: Menorrhagia refactory to medical therapy.  TVH with possible BS reviewed with pt. R/B/Post op care discussed. Pt has verbalized understanding and desires to proceed.   Chancy Milroy 02/23/2020, 9:56 AM

## 2020-02-25 ENCOUNTER — Observation Stay (HOSPITAL_COMMUNITY)
Admission: RE | Admit: 2020-02-25 | Discharge: 2020-02-26 | Disposition: A | Discharge status: Home / Self Care | Payer: Medicaid Other | Attending: Obstetrics and Gynecology | Admitting: Obstetrics and Gynecology

## 2020-02-25 ENCOUNTER — Other Ambulatory Visit: Payer: Self-pay

## 2020-02-25 ENCOUNTER — Encounter (HOSPITAL_COMMUNITY)
Admission: RE | Disposition: A | Discharge status: Home / Self Care | Payer: Self-pay | Source: Home / Self Care | Attending: Obstetrics and Gynecology

## 2020-02-25 ENCOUNTER — Observation Stay (HOSPITAL_COMMUNITY): Payer: Medicaid Other | Admitting: Physician Assistant

## 2020-02-25 ENCOUNTER — Observation Stay (HOSPITAL_COMMUNITY): Payer: Medicaid Other | Admitting: Certified Registered Nurse Anesthetist

## 2020-02-25 ENCOUNTER — Encounter (HOSPITAL_COMMUNITY): Payer: Self-pay | Admitting: Obstetrics and Gynecology

## 2020-02-25 DIAGNOSIS — Z20822 Contact with and (suspected) exposure to covid-19: Secondary | ICD-10-CM | POA: Insufficient documentation

## 2020-02-25 DIAGNOSIS — N888 Other specified noninflammatory disorders of cervix uteri: Secondary | ICD-10-CM | POA: Diagnosis not present

## 2020-02-25 DIAGNOSIS — Z79899 Other long term (current) drug therapy: Secondary | ICD-10-CM | POA: Insufficient documentation

## 2020-02-25 DIAGNOSIS — M1990 Unspecified osteoarthritis, unspecified site: Secondary | ICD-10-CM | POA: Insufficient documentation

## 2020-02-25 DIAGNOSIS — D259 Leiomyoma of uterus, unspecified: Secondary | ICD-10-CM | POA: Diagnosis not present

## 2020-02-25 DIAGNOSIS — N920 Excessive and frequent menstruation with regular cycle: Principal | ICD-10-CM | POA: Insufficient documentation

## 2020-02-25 DIAGNOSIS — N879 Dysplasia of cervix uteri, unspecified: Secondary | ICD-10-CM

## 2020-02-25 DIAGNOSIS — E7800 Pure hypercholesterolemia, unspecified: Secondary | ICD-10-CM | POA: Diagnosis not present

## 2020-02-25 DIAGNOSIS — Z9889 Other specified postprocedural states: Secondary | ICD-10-CM

## 2020-02-25 DIAGNOSIS — N800 Endometriosis of uterus: Secondary | ICD-10-CM | POA: Diagnosis not present

## 2020-02-25 DIAGNOSIS — Z87891 Personal history of nicotine dependence: Secondary | ICD-10-CM | POA: Diagnosis not present

## 2020-02-25 DIAGNOSIS — N938 Other specified abnormal uterine and vaginal bleeding: Secondary | ICD-10-CM

## 2020-02-25 HISTORY — PX: VAGINAL HYSTERECTOMY: SHX2639

## 2020-02-25 LAB — POCT PREGNANCY, URINE: Preg Test, Ur: NEGATIVE

## 2020-02-25 SURGERY — HYSTERECTOMY, VAGINAL
Anesthesia: General

## 2020-02-25 MED ORDER — KETOROLAC TROMETHAMINE 15 MG/ML IJ SOLN
15.0000 mg | INTRAMUSCULAR | Status: AC
  Administered 2020-02-25: 15 mg via INTRAVENOUS
  Filled 2020-02-25: qty 1

## 2020-02-25 MED ORDER — KETOROLAC TROMETHAMINE 30 MG/ML IJ SOLN
30.0000 mg | Freq: Four times a day (QID) | INTRAMUSCULAR | Status: AC
  Administered 2020-02-25 – 2020-02-26 (×4): 30 mg via INTRAVENOUS
  Filled 2020-02-25 (×4): qty 1

## 2020-02-25 MED ORDER — ONDANSETRON HCL 4 MG/2ML IJ SOLN: 4.0000 mg | Freq: Four times a day (QID) | INTRAMUSCULAR | Status: DC | PRN

## 2020-02-25 MED ORDER — SIMETHICONE 80 MG PO CHEW
80.0000 mg | CHEWABLE_TABLET | Freq: Four times a day (QID) | ORAL | Status: DC | PRN
  Administered 2020-02-26: 80 mg via ORAL
  Filled 2020-02-25: qty 1

## 2020-02-25 MED ORDER — LIDOCAINE 2% (20 MG/ML) 5 ML SYRINGE
INTRAMUSCULAR | Status: DC | PRN
  Administered 2020-02-25: 60 mg via INTRAVENOUS

## 2020-02-25 MED ORDER — FENTANYL CITRATE (PF) 100 MCG/2ML IJ SOLN
INTRAMUSCULAR | Status: AC
  Filled 2020-02-25: qty 2

## 2020-02-25 MED ORDER — ONDANSETRON HCL 4 MG PO TABS: 4.0000 mg | ORAL_TABLET | Freq: Four times a day (QID) | ORAL | Status: DC | PRN

## 2020-02-25 MED ORDER — METRONIDAZOLE IN NACL 5-0.79 MG/ML-% IV SOLN
500.0000 mg | INTRAVENOUS | Status: AC
  Administered 2020-02-25: 500 mg via INTRAVENOUS
  Filled 2020-02-25: qty 100

## 2020-02-25 MED ORDER — OXYCODONE-ACETAMINOPHEN 5-325 MG PO TABS: 1.0000 | ORAL_TABLET | ORAL | Status: DC | PRN

## 2020-02-25 MED ORDER — PROPOFOL 10 MG/ML IV BOLUS
INTRAVENOUS | Status: AC
  Filled 2020-02-25: qty 20

## 2020-02-25 MED ORDER — ZOLPIDEM TARTRATE 5 MG PO TABS: 5.0000 mg | ORAL_TABLET | Freq: Every evening | ORAL | Status: DC | PRN

## 2020-02-25 MED ORDER — DEXAMETHASONE SODIUM PHOSPHATE 10 MG/ML IJ SOLN
INTRAMUSCULAR | Status: AC
  Filled 2020-02-25: qty 1

## 2020-02-25 MED ORDER — SUGAMMADEX SODIUM 200 MG/2ML IV SOLN
INTRAVENOUS | Status: DC | PRN
  Administered 2020-02-25: 50 mg via INTRAVENOUS
  Administered 2020-02-25: 200 mg via INTRAVENOUS

## 2020-02-25 MED ORDER — MIDAZOLAM HCL 5 MG/5ML IJ SOLN
INTRAMUSCULAR | Status: DC | PRN
  Administered 2020-02-25: 2 mg via INTRAVENOUS

## 2020-02-25 MED ORDER — HYDROMORPHONE HCL 1 MG/ML IJ SOLN
INTRAMUSCULAR | Status: AC
  Filled 2020-02-25: qty 1

## 2020-02-25 MED ORDER — SENNA 8.6 MG PO TABS
1.0000 | ORAL_TABLET | Freq: Two times a day (BID) | ORAL | Status: DC
  Administered 2020-02-25 – 2020-02-26 (×3): 8.6 mg via ORAL
  Filled 2020-02-25 (×3): qty 1

## 2020-02-25 MED ORDER — PROPOFOL 10 MG/ML IV BOLUS
INTRAVENOUS | Status: DC | PRN
  Administered 2020-02-25: 200 mg via INTRAVENOUS

## 2020-02-25 MED ORDER — ORAL CARE MOUTH RINSE: 15.0000 mL | Freq: Once | OROMUCOSAL | Status: AC

## 2020-02-25 MED ORDER — LIDOCAINE 2% (20 MG/ML) 5 ML SYRINGE
INTRAMUSCULAR | Status: AC
  Filled 2020-02-25: qty 5

## 2020-02-25 MED ORDER — HYDROMORPHONE HCL 1 MG/ML IJ SOLN
0.2500 mg | INTRAMUSCULAR | Status: DC | PRN
  Administered 2020-02-25 (×2): 0.5 mg via INTRAVENOUS

## 2020-02-25 MED ORDER — GENTAMICIN SULFATE 40 MG/ML IJ SOLN
5.0000 mg/kg | INTRAVENOUS | Status: AC
  Administered 2020-02-25: 420 mg via INTRAVENOUS
  Filled 2020-02-25: qty 10.5

## 2020-02-25 MED ORDER — ACETAMINOPHEN 500 MG PO TABS
1000.0000 mg | ORAL_TABLET | ORAL | Status: AC
  Administered 2020-02-25: 1000 mg via ORAL
  Filled 2020-02-25: qty 2

## 2020-02-25 MED ORDER — SOD CITRATE-CITRIC ACID 500-334 MG/5ML PO SOLN
30.0000 mL | ORAL | Status: AC
  Administered 2020-02-25: 30 mL via ORAL
  Filled 2020-02-25: qty 30

## 2020-02-25 MED ORDER — DEXAMETHASONE SODIUM PHOSPHATE 10 MG/ML IJ SOLN
INTRAMUSCULAR | Status: DC | PRN
  Administered 2020-02-25: 10 mg via INTRAVENOUS

## 2020-02-25 MED ORDER — HYDROMORPHONE HCL 1 MG/ML IJ SOLN: 1.0000 mg | INTRAMUSCULAR | Status: DC | PRN

## 2020-02-25 MED ORDER — ROCURONIUM BROMIDE 100 MG/10ML IV SOLN
INTRAVENOUS | Status: DC | PRN
  Administered 2020-02-25: 50 mg via INTRAVENOUS
  Administered 2020-02-25: 10 mg via INTRAVENOUS

## 2020-02-25 MED ORDER — FENTANYL CITRATE (PF) 250 MCG/5ML IJ SOLN
INTRAMUSCULAR | Status: DC | PRN
  Administered 2020-02-25: 50 ug via INTRAVENOUS
  Administered 2020-02-25: 100 ug via INTRAVENOUS
  Administered 2020-02-25 (×2): 50 ug via INTRAVENOUS

## 2020-02-25 MED ORDER — OXYCODONE-ACETAMINOPHEN 5-325 MG PO TABS
2.0000 | ORAL_TABLET | ORAL | Status: DC | PRN
  Administered 2020-02-25 – 2020-02-26 (×4): 2 via ORAL
  Filled 2020-02-25 (×4): qty 2

## 2020-02-25 MED ORDER — LACTATED RINGERS IV SOLN: INTRAVENOUS | Status: DC

## 2020-02-25 MED ORDER — ONDANSETRON HCL 4 MG/2ML IJ SOLN
INTRAMUSCULAR | Status: AC
  Filled 2020-02-25: qty 2

## 2020-02-25 MED ORDER — FENTANYL CITRATE (PF) 100 MCG/2ML IJ SOLN
25.0000 ug | INTRAMUSCULAR | Status: DC | PRN
  Administered 2020-02-25: 25 ug via INTRAVENOUS
  Administered 2020-02-25: 50 ug via INTRAVENOUS
  Administered 2020-02-25: 25 ug via INTRAVENOUS
  Administered 2020-02-25: 50 ug via INTRAVENOUS

## 2020-02-25 MED ORDER — ROCURONIUM BROMIDE 10 MG/ML (PF) SYRINGE
PREFILLED_SYRINGE | INTRAVENOUS | Status: AC
  Filled 2020-02-25: qty 10

## 2020-02-25 MED ORDER — IBUPROFEN 800 MG PO TABS
800.0000 mg | ORAL_TABLET | Freq: Three times a day (TID) | ORAL | Status: DC
  Filled 2020-02-25: qty 1

## 2020-02-25 MED ORDER — MIDAZOLAM HCL 2 MG/2ML IJ SOLN
INTRAMUSCULAR | Status: AC
  Filled 2020-02-25: qty 2

## 2020-02-25 MED ORDER — FENTANYL CITRATE (PF) 250 MCG/5ML IJ SOLN
INTRAMUSCULAR | Status: AC
  Filled 2020-02-25: qty 5

## 2020-02-25 MED ORDER — ONDANSETRON HCL 4 MG/2ML IJ SOLN
INTRAMUSCULAR | Status: DC | PRN
  Administered 2020-02-25: 4 mg via INTRAVENOUS

## 2020-02-25 MED ORDER — CHLORHEXIDINE GLUCONATE 0.12 % MT SOLN
15.0000 mL | Freq: Once | OROMUCOSAL | Status: AC
  Administered 2020-02-25: 15 mL via OROMUCOSAL
  Filled 2020-02-25: qty 15

## 2020-02-25 SURGICAL SUPPLY — 25 items
BLADE SURG 10 STRL SS (BLADE) ×3 IMPLANT
CANISTER SUCT 3000ML PPV (MISCELLANEOUS) ×3 IMPLANT
COVER WAND RF STERILE (DRAPES) ×3 IMPLANT
GAUZE 4X4 16PLY RFD (DISPOSABLE) ×5 IMPLANT
GLOVE BIO SURGEON STRL SZ7.5 (GLOVE) ×3 IMPLANT
GLOVE BIOGEL PI IND STRL 6.5 (GLOVE) ×1 IMPLANT
GLOVE BIOGEL PI IND STRL 7.0 (GLOVE) ×2 IMPLANT
GLOVE BIOGEL PI INDICATOR 6.5 (GLOVE) ×2
GLOVE BIOGEL PI INDICATOR 7.0 (GLOVE) ×2
GLOVE INDICATOR 8.0 STRL GRN (GLOVE) ×3 IMPLANT
GOWN STRL REUS W/ TWL LRG LVL3 (GOWN DISPOSABLE) ×3 IMPLANT
GOWN STRL REUS W/ TWL XL LVL3 (GOWN DISPOSABLE) ×1 IMPLANT
GOWN STRL REUS W/TWL LRG LVL3 (GOWN DISPOSABLE) ×6
GOWN STRL REUS W/TWL XL LVL3 (GOWN DISPOSABLE) ×6
KIT TURNOVER KIT B (KITS) ×3 IMPLANT
NS IRRIG 1000ML POUR BTL (IV SOLUTION) ×3 IMPLANT
PACK VAGINAL WOMENS (CUSTOM PROCEDURE TRAY) ×3 IMPLANT
PAD OB MATERNITY 4.3X12.25 (PERSONAL CARE ITEMS) ×3 IMPLANT
SUT VIC AB 2-0 CT1 18 (SUTURE) ×3 IMPLANT
SUT VIC AB 2-0 CT1 27 (SUTURE) ×3
SUT VIC AB 2-0 CT1 TAPERPNT 27 (SUTURE) ×1 IMPLANT
SUT VIC AB PLUS 45CM 1-MO-4 (SUTURE) ×8 IMPLANT
SUT VICRYL 1 TIES 12X18 (SUTURE) ×3 IMPLANT
TOWEL GREEN STERILE FF (TOWEL DISPOSABLE) ×4 IMPLANT
TRAY FOLEY W/BAG SLVR 14FR (SET/KITS/TRAYS/PACK) ×3 IMPLANT

## 2020-02-25 NOTE — Anesthesia Preprocedure Evaluation (Addendum)
Anesthesia Evaluation  Patient identified by MRN, date of birth, ID band Patient awake    Reviewed: Allergy & Precautions, NPO status , Patient's Chart, lab work & pertinent test results  History of Anesthesia Complications Negative for: history of anesthetic complications  Airway Mallampati: II  TM Distance: >3 FB Neck ROM: Full    Dental  (+) Teeth Intact   Pulmonary neg pulmonary ROS, former smoker,    Pulmonary exam normal        Cardiovascular negative cardio ROS Normal cardiovascular exam     Neuro/Psych negative neurological ROS  negative psych ROS   GI/Hepatic negative GI ROS, Neg liver ROS,   Endo/Other  negative endocrine ROS  Renal/GU negative Renal ROS  negative genitourinary   Musculoskeletal negative musculoskeletal ROS (+)   Abdominal   Peds  Hematology negative hematology ROS (+)   Anesthesia Other Findings   Reproductive/Obstetrics                           Anesthesia Physical Anesthesia Plan  ASA: II  Anesthesia Plan: General   Post-op Pain Management:    Induction: Intravenous  PONV Risk Score and Plan: 4 or greater and Ondansetron, Dexamethasone, Treatment may vary due to age or medical condition and Midazolam  Airway Management Planned: Oral ETT  Additional Equipment: None  Intra-op Plan:   Post-operative Plan: Extubation in OR  Informed Consent: I have reviewed the patients History and Physical, chart, labs and discussed the procedure including the risks, benefits and alternatives for the proposed anesthesia with the patient or authorized representative who has indicated his/her understanding and acceptance.     Dental advisory given  Plan Discussed with:   Anesthesia Plan Comments:        Anesthesia Quick Evaluation

## 2020-02-25 NOTE — Progress Notes (Signed)
Pt refused urinary catheter removal at this time. Will continue to monitor.

## 2020-02-25 NOTE — Interval H&P Note (Signed)
History and Physical Interval Note:  02/25/2020 10:25 AM  Brittany Smith  has presented today for surgery, with the diagnosis of Menorrhagia.  The various methods of treatment have been discussed with the patient and family. After consideration of risks, benefits and other options for treatment, the patient has consented to  Procedure(s): HYSTERECTOMY VAGINAL (N/A) as a surgical intervention.  The patient's history has been reviewed, patient examined, no change in status, stable for surgery.  I have reviewed the patient's chart and labs.  Questions were answered to the patient's satisfaction.     Chancy Milroy

## 2020-02-25 NOTE — Plan of Care (Signed)

## 2020-02-25 NOTE — Transfer of Care (Signed)
Immediate Anesthesia Transfer of Care Note  Patient: Brittany Smith  Procedure(s) Performed: HYSTERECTOMY VAGINAL (N/A )  Patient Location: PACU  Anesthesia Type:General  Level of Consciousness: drowsy  Airway & Oxygen Therapy: Patient Spontanous Breathing and Patient connected to face mask oxygen  Post-op Assessment: Report given to RN and Post -op Vital signs reviewed and stable  Post vital signs: Reviewed  Last Vitals:  Vitals Value Taken Time  BP 122/70 02/25/20 1221  Temp    Pulse 71 02/25/20 1224  Resp 12 02/25/20 1224  SpO2 100 % 02/25/20 1224  Vitals shown include unvalidated device data.  Last Pain:  Vitals:   02/25/20 0911  TempSrc: Oral  PainSc:          Complications: No apparent anesthesia complications

## 2020-02-25 NOTE — Progress Notes (Signed)
Pt refuses to get up and walk tonight, also refused foley cath removal. Will try again later.

## 2020-02-25 NOTE — Op Note (Signed)
Brittany Smith PROCEDURE DATE: 02/25/2020  PREOPERATIVE DIAGNOSIS:   Menorrhagia POSTOPERATIVE DIAGNOSIS:  SAA SURGEON:  M. Rip Harbour, M.D. ASSISTANT: Aletha Halim, M.D. An experienced assistant was required given the standard of surgical care given the complexity of the case.  This assistant was needed for exposure, dissection, suctioning, retraction and for overall help during the procedure.   OPERATION:  Total Vaginal hysterectomy with morcellation ANESTHESIA:  General endotracheal.  INDICATIONS: The patient is a 44 y.o. [id number] with history of symptomatic menorrhagia. The patient made a decision to undergo definite surgical treatment. On the preoperative visit, the risks, benefits, indications, and alternatives of the procedure were reviewed with the patient.  On the day of surgery, the risks of surgery were again discussed with the patient including but not limited to: bleeding which may require transfusion or reoperation; infection which may require antibiotics; injury to bowel, bladder, ureters or other surrounding organs; need for additional procedures; thromboembolic phenomenon, incisional problems and other postoperative/anesthesia complications. Written informed consent was obtained.    OPERATIVE FINDINGS: A 10 week size uterus with absent tubes tubes and ovaries bilaterally.  ESTIMATED BLOOD LOSS: 200 ml FLUIDS: As recorded URINE OUTPUT:  As recorded SPECIMENS:  Uterus and cervix sent to pathology COMPLICATIONS:  None immediate.  DESCRIPTION OF PROCEDURE:  The patient received intravenous antibiotics and had sequential compression devices applied to her lower extremities while in the preoperative area.  She was then taken to the operating room where general anesthesia was administered and was found to be adequate.  She was placed in the dorsal lithotomy position, and was prepped and draped in a sterile manner.  A Foley catheter was inserted into her bladder and attached to  constant drainage. After an adequate timeout was performed, attention was turned to her pelvis.  A weighted speculum was then placed in the vagina and the posterior lip of the cervix were grasped  with tenaculum. The posterior cul de sac was entered sharply. Long bill weight speculum was placed. The anterior cervix was grasped with tenaculum.  The cervix was then circumferentially incised, and the bladder was dissected off the pubocervical fascia anteriorly without complication.  The anterior cul-de-sac was then entered sharply without difficulty and a retractor was placed.  Zeplin clamps were then used to clamp the uterosacral ligaments on either side.  They were then cut and sutured ligated with 0 Vicryl. Of note, all sutures used in this case were 0 Vicryl unless otherwise noted.   The cardinal ligaments were then clamped, cut and ligated. The uterine vessels and broad ligaments were then serially clamped with the Zeplin clamps, cut, and suture ligated on both sides. Due to size of the body of the uterus and to allow more visualization. The uterus was morcellated using a bivalve technique. This allow visualization of the final bilateral IP pedicles. They were clamped, cut and ligated with a free tie and then in a [place] fashion.   Excellent hemostasis was noted at this point. Each ovary was seen and noted to appear normal.  The tubes were noted to be absent and this concurred with pt's history of bilateral salpingectomy in the past.   After completion of the hysterectomy, all pedicles from the uterosacral ligament to the cornua were examined hemostasis was confirmed.  The perineum was closed with 2/0 Vicryl in a purse string fashion. The vaginal cuff was then closed with 2/0 Vicryl.   All instruments were then removed from the pelvis.  The patient tolerated the procedure well.  All instruments, needles, and sponge counts were correct x 2. The patient was taken to the recovery room in stable condition.     Arlina Robes, MD, [place] Attending [place], [facility]

## 2020-02-25 NOTE — Progress Notes (Signed)
Pt arrived to room 6N31 via bed after surgery. Received report from Arnette Norris, RN in PACU. See assessment. Will continue to monitor.

## 2020-02-25 NOTE — Anesthesia Procedure Notes (Signed)
Procedure Name: Intubation Date/Time: 02/25/2020 10:54 AM Performed by: Janene Harvey, CRNA Pre-anesthesia Checklist: Patient identified, Emergency Drugs available, Suction available and Patient being monitored Patient Re-evaluated:Patient Re-evaluated prior to induction Oxygen Delivery Method: Circle system utilized Preoxygenation: Pre-oxygenation with 100% oxygen Induction Type: IV induction Ventilation: Mask ventilation without difficulty Laryngoscope Size: Mac and 4 Grade View: Grade II Tube type: Oral Tube size: 7.0 mm Number of attempts: 1 Airway Equipment and Method: Stylet and Oral airway Placement Confirmation: ETT inserted through vocal cords under direct vision,  positive ETCO2 and breath sounds checked- equal and bilateral Secured at: 22 cm Tube secured with: Tape Dental Injury: Teeth and Oropharynx as per pre-operative assessment

## 2020-02-25 NOTE — Anesthesia Postprocedure Evaluation (Signed)
Anesthesia Post Note  Patient: Brittany Smith  Procedure(s) Performed: HYSTERECTOMY VAGINAL (N/A )     Patient location during evaluation: PACU Anesthesia Type: General Level of consciousness: awake and alert Pain management: pain level controlled Vital Signs Assessment: post-procedure vital signs reviewed and stable Respiratory status: spontaneous breathing, nonlabored ventilation, respiratory function stable and patient connected to nasal cannula oxygen Cardiovascular status: blood pressure returned to baseline and stable Postop Assessment: no apparent nausea or vomiting Anesthetic complications: no    Last Vitals:  Vitals:   02/25/20 1414 02/25/20 1458  BP: 132/77   Pulse:  (!) 50  Resp: 14   Temp: 36.6 C   SpO2: 99%     Last Pain:  Vitals:   02/25/20 1534  TempSrc:   PainSc: 10-Worst pain ever                 Lidia Collum

## 2020-02-25 NOTE — Progress Notes (Signed)
Pt complaining of severe pain despite percocet admin. HR 45. Heat pack applied. Office for Rip Harbour, MD notified. Stinson, DO returned call. No new orders at this time. Will continue to monitor.

## 2020-02-26 ENCOUNTER — Other Ambulatory Visit: Payer: Self-pay

## 2020-02-26 ENCOUNTER — Emergency Department (HOSPITAL_COMMUNITY)
Admission: EM | Admit: 2020-02-26 | Discharge: 2020-02-27 | Disposition: A | Discharge status: Home / Self Care | Payer: Medicaid Other | Attending: Emergency Medicine | Admitting: Emergency Medicine

## 2020-02-26 DIAGNOSIS — Z5321 Procedure and treatment not carried out due to patient leaving prior to being seen by health care provider: Secondary | ICD-10-CM | POA: Insufficient documentation

## 2020-02-26 DIAGNOSIS — R1030 Lower abdominal pain, unspecified: Secondary | ICD-10-CM | POA: Diagnosis not present

## 2020-02-26 DIAGNOSIS — G8918 Other acute postprocedural pain: Secondary | ICD-10-CM | POA: Insufficient documentation

## 2020-02-26 DIAGNOSIS — N920 Excessive and frequent menstruation with regular cycle: Secondary | ICD-10-CM | POA: Diagnosis not present

## 2020-02-26 LAB — BASIC METABOLIC PANEL
Anion gap: 9 (ref 5–15)
BUN: 5 mg/dL — ABNORMAL LOW (ref 6–20)
CO2: 23 mmol/L (ref 22–32)
Calcium: 8.6 mg/dL — ABNORMAL LOW (ref 8.9–10.3)
Chloride: 104 mmol/L (ref 98–111)
Creatinine, Ser: 0.67 mg/dL (ref 0.44–1.00)
GFR calc Af Amer: >60 mL/min (ref >60)
GFR calc non Af Amer: >60 mL/min (ref >60)
Glucose, Bld: 130 mg/dL — ABNORMAL HIGH (ref 70–99)
Potassium: 4.3 mmol/L (ref 3.5–5.1)
Sodium: 136 mmol/L (ref 135–145)

## 2020-02-26 LAB — CBC
HCT: 35.2 % — ABNORMAL LOW (ref 36.0–46.0)
Hemoglobin: 12 g/dL (ref 12.0–15.0)
MCH: 28.7 pg (ref 26.0–34.0)
MCHC: 34.1 g/dL (ref 30.0–36.0)
MCV: 84.2 fL (ref 80.0–100.0)
Platelets: 286 10*3/uL (ref 150–400)
RBC: 4.18 MIL/uL (ref 3.87–5.11)
RDW: 14.3 % (ref 11.5–15.5)
WBC: 19.5 10*3/uL — ABNORMAL HIGH (ref 4.0–10.5)
nRBC: 0 % (ref 0.0–0.2)

## 2020-02-26 LAB — TYPE AND SCREEN
ABO/RH(D): B POS
Antibody Screen: NEGATIVE

## 2020-02-26 LAB — SURGICAL PATHOLOGY

## 2020-02-26 MED ORDER — OXYCODONE-ACETAMINOPHEN 5-325 MG PO TABS: 1.0000 | ORAL_TABLET | ORAL | 0 refills | Status: DC | PRN

## 2020-02-26 MED ORDER — IBUPROFEN 800 MG PO TABS: 800.0000 mg | ORAL_TABLET | Freq: Three times a day (TID) | ORAL | 1 refills | Status: DC

## 2020-02-26 NOTE — Plan of Care (Signed)
  Problem: Activity: Goal: Risk for activity intolerance will decrease Outcome: Progressing   Problem: Nutrition: Goal: Adequate nutrition will be maintained Outcome: Progressing   Problem: Pain Managment: Goal: General experience of comfort will improve Outcome: Progressing   

## 2020-02-26 NOTE — ED Triage Notes (Signed)
Pt had vaginal hysterectomy yesterday, by dr. Laurena Bering with [facility]. Pt presenting with lower abdominal pain and two episodes of "bright red" blood when she vomited. No large amounts of vaginal bleeding. Did not take any pain meds PTA.

## 2020-02-26 NOTE — Discharge Instructions (Signed)
Vaginal Hysterectomy, Care After Refer to this sheet in the next few weeks. These instructions provide you with information about caring for yourself after your procedure. Your health care provider may also give you more specific instructions. Your treatment has been planned according to current medical practices, but problems sometimes occur. Call your health care provider if you have any problems or questions after your procedure. What can I expect after the procedure? After the procedure, it is common to have:  Pain.  Soreness and numbness in your incision areas.  Vaginal bleeding and discharge.  Constipation.  Temporary problems emptying the bladder.  Feelings of sadness or other emotions. Follow these instructions at home: Medicines  Take over-the-counter and prescription medicines only as told by your health care provider.  If you were prescribed an antibiotic medicine, take it as told by your health care provider. Do not stop taking the antibiotic even if you start to feel better.  Do not drive or operate heavy machinery while taking prescription pain medicine. Activity  Return to your normal activities as told by your health care provider. Ask your health care provider what activities are safe for you.  Get regular exercise as told by your health care provider. You may be told to take short walks every day and go farther each time.  Do not lift anything that is heavier than 10 lb (4.5 kg). General instructions   Do not put anything in your vagina for 6 weeks after your surgery or as told by your health care provider. This includes tampons and douches.  Do not have sex until your health care provider says you can.  Do not take baths, swim, or use a hot tub until your health care provider approves.  Drink enough fluid to keep your urine clear or pale yellow.  Do not drive for 24 hours if you were given a sedative.  Keep all follow-up visits as told by your health  care provider. This is important. Contact a health care provider if:  Your pain medicine is not helping.  You have a fever.  You have redness, swelling, or pain at your incision site.  You have blood, pus, or a bad-smelling discharge from your vagina.  You continue to have difficulty urinating. Get help right away if:  You have severe abdominal or back pain.  You have heavy bleeding from your vagina.  You have chest pain or shortness of breath. This information is not intended to replace advice given to you by your health care provider. Make sure you discuss any questions you have with your health care provider. Document Revised: 05/12/2016 Document Reviewed: 10/04/2015 Elsevier Patient Education  [place] Can Do to Manage Your COVID-19 Symptoms at Home If you have possible or confirmed COVID-19: 1. Stay home from work and school. And stay away from other public places. If you must go out, avoid using any kind of public transportation, ridesharing, or taxis. 2. Monitor your symptoms carefully. If your symptoms get worse, call your healthcare provider immediately. 3. Get rest and stay hydrated. 4. If you have a medical appointment, call the healthcare provider ahead of time and tell them that you have or may have COVID-19. 5. For medical emergencies, call 911 and notify the dispatch personnel that you have or may have COVID-19. 6. Cover your cough and sneezes with a tissue or use the inside of your elbow. 7. Wash your hands often with soap and water for at least 20 seconds  or clean your hands with an alcohol-based hand sanitizer that contains at least 60% alcohol. 8. As much as possible, stay in a specific room and away from other people in your home. Also, you should use a separate bathroom, if available. If you need to be around other people in or outside of the home, wear a mask. 9. Avoid sharing personal items with other people in your household, like  dishes, towels, and bedding. 10. Clean all surfaces that are touched often, like counters, tabletops, and doorknobs. Use household cleaning sprays or wipes according to the label instructions. [email or web address] 04/03/2019 This information is not intended to replace advice given to you by your health care provider. Make sure you discuss any questions you have with your health care provider. Document Revised: 09/05/2019 Document Reviewed: 09/05/2019 Elsevier Patient Education  [place].

## 2020-02-26 NOTE — Discharge Summary (Signed)
Physician Discharge Summary  Patient ID: Brittany Smith MRN: [id number] DOB/AGE: 44/12/1975 44 y.o.  Admit date: 02/25/2020 Discharge date: 02/26/2020  Admission Diagnoses: Menorrhagia. DUB  Discharge Diagnoses:  Active Problems:   Post-operative state   Uterine bleeding, dysfunctional   Discharged Condition: good  Hospital Course: Ms Love was admitted with above Dx. Underwent TVH with morcellation. See OP note for additional information. Pt's post op course was unremarkable. She progressed to ambulating, voiding, tolerating diet, + flatus and good oral pain control. Felt amendable for discharge home. Discharge instructions, medications and follow up reviewed with pt. Pt verbalized understanding.  Consults: None  Significant Diagnostic Studies: labs  Treatments: surgery: TVH  Discharge Exam: Blood pressure (!) 107/58, pulse (!) 52, temperature 98.4 F (36.9 C), temperature source Oral, resp. rate 18, height 5\' 10"  (1.778 m), weight 81.6 kg, SpO2 100 %. Lungs clear Heart RRR Abd soft + BS GU no bleeding Ext nontender  Disposition: Discharge disposition: 01-Home or Self Care       Discharge Instructions    Call MD for:  difficulty breathing, headache or visual disturbances   Complete by: As directed    Call MD for:  extreme fatigue   Complete by: As directed    Call MD for:  hives   Complete by: As directed    Call MD for:  persistant dizziness or light-headedness   Complete by: As directed    Call MD for:  persistant nausea and vomiting   Complete by: As directed    Call MD for:  redness, tenderness, or signs of infection (pain, swelling, redness, odor or green/yellow discharge around incision site)   Complete by: As directed    Call MD for:  severe uncontrolled pain   Complete by: As directed    Call MD for:  temperature >100.4   Complete by: As directed    Diet - low sodium heart healthy   Complete by: As directed    Increase activity slowly   Complete by:  As directed    Sexual Activity Restrictions   Complete by: As directed    Pelvic rest x 4 weeks     Allergies as of 02/26/2020      Reactions   Ceftriaxone Itching   Tramadol Nausea And Vomiting, Other (See Comments)   Drowsy       Medication List    STOP taking these medications   tranexamic acid 650 MG Tabs tablet Commonly known as: LYSTEDA     TAKE these medications   fexofenadine 180 MG tablet Commonly known as: ALLEGRA Take 180 mg by mouth daily.   ibuprofen 800 MG tablet Commonly known as: ADVIL Take 1 tablet (800 mg total) by mouth 3 (three) times daily.   levocetirizine 5 MG tablet Commonly known as: XYZAL Take 5 mg by mouth at bedtime.   MELATONIN PO Take 1 tablet by mouth at bedtime as needed (sleep).   oxyCODONE-acetaminophen 5-325 MG tablet Commonly known as: PERCOCET/ROXICET Take 1 tablet by mouth every 4 (four) hours as needed for moderate pain.      Follow-up Information    [facility]. Schedule an appointment as soon as possible for a visit in 4 week(s).   Why: Post op appt with Dr. Gardiner Fanti Contact information: [address] [place] [phone]          Signed: Chancy Milroy 02/26/2020, 12:27 PM

## 2020-02-27 LAB — CBC
HCT: 33.7 % — ABNORMAL LOW (ref 36.0–46.0)
Hemoglobin: 11.4 g/dL — ABNORMAL LOW (ref 12.0–15.0)
MCH: 28.4 pg (ref 26.0–34.0)
MCHC: 33.8 g/dL (ref 30.0–36.0)
MCV: 84 fL (ref 80.0–100.0)
Platelets: 291 10*3/uL (ref 150–400)
RBC: 4.01 MIL/uL (ref 3.87–5.11)
RDW: 14.2 % (ref 11.5–15.5)
WBC: 11.4 10*3/uL — ABNORMAL HIGH (ref 4.0–10.5)
nRBC: 0 % (ref 0.0–0.2)

## 2020-02-27 LAB — COMPREHENSIVE METABOLIC PANEL
ALT: 7 U/L (ref 0–44)
AST: 15 U/L (ref 15–41)
Albumin: 3.3 g/dL — ABNORMAL LOW (ref 3.5–5.0)
Alkaline Phosphatase: 47 U/L (ref 38–126)
Anion gap: 6 (ref 5–15)
BUN: <5 mg/dL — ABNORMAL LOW (ref 6–20)
CO2: 25 mmol/L (ref 22–32)
Calcium: 8.6 mg/dL — ABNORMAL LOW (ref 8.9–10.3)
Chloride: 106 mmol/L (ref 98–111)
Creatinine, Ser: 0.8 mg/dL (ref 0.44–1.00)
GFR calc Af Amer: >60 mL/min (ref >60)
GFR calc non Af Amer: >60 mL/min (ref >60)
Glucose, Bld: 109 mg/dL — ABNORMAL HIGH (ref 70–99)
Potassium: 4.4 mmol/L (ref 3.5–5.1)
Sodium: 137 mmol/L (ref 135–145)
Total Bilirubin: 0.2 mg/dL — ABNORMAL LOW (ref 0.3–1.2)
Total Protein: 5.6 g/dL — ABNORMAL LOW (ref 6.5–8.1)

## 2020-02-27 NOTE — ED Notes (Signed)
No answer x2 for vitals recheck 

## 2020-02-27 NOTE — ED Notes (Signed)
No answer for vitals reheck x1

## 2020-03-09 ENCOUNTER — Ambulatory Visit (HOSPITAL_BASED_OUTPATIENT_CLINIC_OR_DEPARTMENT_OTHER): Payer: Medicaid Other | Admitting: Obstetrics and Gynecology

## 2020-03-09 ENCOUNTER — Encounter: Payer: Self-pay | Admitting: Obstetrics and Gynecology

## 2020-03-09 ENCOUNTER — Other Ambulatory Visit: Payer: Self-pay

## 2020-03-09 VITALS — BP 119/70 | HR 69 | Wt 183.0 lb

## 2020-03-09 DIAGNOSIS — Z9889 Other specified postprocedural states: Secondary | ICD-10-CM

## 2020-03-09 DIAGNOSIS — Z90710 Acquired absence of both cervix and uterus: Secondary | ICD-10-CM

## 2020-03-09 DIAGNOSIS — Z48816 Encounter for surgical aftercare following surgery on the genitourinary system: Secondary | ICD-10-CM

## 2020-03-09 NOTE — Patient Instructions (Signed)
Health Maintenance, Female Adopting a healthy lifestyle and getting preventive care are important in promoting health and wellness. Ask your health care provider about:  The right schedule for you to have regular tests and exams.  Things you can do on your own to prevent diseases and keep yourself healthy. What should I know about diet, weight, and exercise? Eat a healthy diet   Eat a diet that includes plenty of vegetables, fruits, low-fat dairy products, and lean protein.  Do not eat a lot of foods that are high in solid fats, added sugars, or sodium. Maintain a healthy weight Body mass index (BMI) is used to identify weight problems. It estimates body fat based on height and weight. Your health care provider can help determine your BMI and help you achieve or maintain a healthy weight. Get regular exercise Get regular exercise. This is one of the most important things you can do for your health. Most adults should:  Exercise for at least 150 minutes each week. The exercise should increase your heart rate and make you sweat (moderate-intensity exercise).  Do strengthening exercises at least twice a week. This is in addition to the moderate-intensity exercise.  Spend less time sitting. Even light physical activity can be beneficial. Watch cholesterol and blood lipids Have your blood tested for lipids and cholesterol at 44 years of age, then have this test every 5 years. Have your cholesterol levels checked more often if:  Your lipid or cholesterol levels are high.  You are older than 44 years of age.  You are at high risk for heart disease. What should I know about cancer screening? Depending on your health history and family history, you may need to have cancer screening at various ages. This may include screening for:  Breast cancer.  Cervical cancer.  Colorectal cancer.  Skin cancer.  Lung cancer. What should I know about heart disease, diabetes, and high blood  pressure? Blood pressure and heart disease  High blood pressure causes heart disease and increases the risk of stroke. This is more likely to develop in people who have high blood pressure readings, are of African descent, or are overweight.  Have your blood pressure checked: ? Every 3-5 years if you are 18-39 years of age. ? Every year if you are 40 years old or older. Diabetes Have regular diabetes screenings. This checks your fasting blood sugar level. Have the screening done:  Once every three years after age 40 if you are at a normal weight and have a low risk for diabetes.  More often and at a younger age if you are overweight or have a high risk for diabetes. What should I know about preventing infection? Hepatitis B If you have a higher risk for hepatitis B, you should be screened for this virus. Talk with your health care provider to find out if you are at risk for hepatitis B infection. Hepatitis C Testing is recommended for:  Everyone born from 1945 through 1965.  Anyone with known risk factors for hepatitis C. Sexually transmitted infections (STIs)  Get screened for STIs, including gonorrhea and chlamydia, if: ? You are sexually active and are younger than 44 years of age. ? You are older than 44 years of age and your health care provider tells you that you are at risk for this type of infection. ? Your sexual activity has changed since you were last screened, and you are at increased risk for chlamydia or gonorrhea. Ask your health care provider if   you are at risk.  Ask your health care provider about whether you are at high risk for HIV. Your health care provider may recommend a prescription medicine to help prevent HIV infection. If you choose to take medicine to prevent HIV, you should first get tested for HIV. You should then be tested every 3 months for as long as you are taking the medicine. Pregnancy  If you are about to stop having your period (premenopausal) and  you may become pregnant, seek counseling before you get pregnant.  Take 400 to 800 micrograms (mcg) of folic acid every day if you become pregnant.  Ask for birth control (contraception) if you want to prevent pregnancy. Osteoporosis and menopause Osteoporosis is a disease in which the bones lose minerals and strength with aging. This can result in bone fractures. If you are 65 years old or older, or if you are at risk for osteoporosis and fractures, ask your health care provider if you should:  Be screened for bone loss.  Take a calcium or vitamin D supplement to lower your risk of fractures.  Be given hormone replacement therapy (HRT) to treat symptoms of menopause. Follow these instructions at home: Lifestyle  Do not use any products that contain nicotine or tobacco, such as cigarettes, e-cigarettes, and chewing tobacco. If you need help quitting, ask your health care provider.  Do not use street drugs.  Do not share needles.  Ask your health care provider for help if you need support or information about quitting drugs. Alcohol use  Do not drink alcohol if: ? Your health care provider tells you not to drink. ? You are pregnant, may be pregnant, or are planning to become pregnant.  If you drink alcohol: ? Limit how much you use to 0-1 drink a day. ? Limit intake if you are breastfeeding.  Be aware of how much alcohol is in your drink. In the [place] one drink equals one 12 oz bottle of beer (355 mL), one 5 oz glass of wine (148 mL), or one 1 oz glass of hard liquor (44 mL). General instructions  Schedule regular health, dental, and eye exams.  Stay current with your vaccines.  Tell your health care provider if: ? You often feel depressed. ? You have ever been abused or do not feel safe at home. Summary  Adopting a healthy lifestyle and getting preventive care are important in promoting health and wellness.  Follow your health care provider's instructions about healthy  diet, exercising, and getting tested or screened for diseases.  Follow your health care provider's instructions on monitoring your cholesterol and blood pressure. This information is not intended to replace advice given to you by your health care provider. Make sure you discuss any questions you have with your health care provider. Document Revised: 09/12/2018 Document Reviewed: 09/12/2018 Elsevier Patient Education  2020 [employer] Inc.  

## 2020-03-09 NOTE — Progress Notes (Signed)
Patient ID: Brittany Smith, female   DOB: Mar 02, 1976, 44 y.o.   MRN: [id number] Ms Erling Cruz is here for post op visit S/P TVH on 02/25/20 Pathology reviewed with pt Pt denies any bowel or bladder dysfunction. Tolerating diet. Ambulating without problems  PE AF VSS Lungs clear Heart RRR Abd soft + BS GU cuff healing well Bimanual uterus and cervix absent non tender and no masses  A/P Post opTVH  Return in nl ADL's as tolerates. Pelvic rest until 4 weeks post op F/U PRN or in 1 yr

## 2020-03-09 NOTE — Progress Notes (Signed)
RGYN patient present for Post -Op visit today.   Procedure :02/25/20  Total Vaginal hysterectomy with morcellation  CC: Bloating and gas.

## 2020-07-01 ENCOUNTER — Other Ambulatory Visit: Payer: Self-pay | Admitting: Surgery

## 2020-07-01 DIAGNOSIS — Z1231 Encounter for screening mammogram for malignant neoplasm of breast: Secondary | ICD-10-CM

## 2020-07-24 ENCOUNTER — Ambulatory Visit: Payer: Medicaid Other

## 2020-07-24 ENCOUNTER — Other Ambulatory Visit: Payer: Self-pay | Admitting: Surgery

## 2020-07-24 DIAGNOSIS — Z1231 Encounter for screening mammogram for malignant neoplasm of breast: Secondary | ICD-10-CM

## 2020-09-03 ENCOUNTER — Encounter: Payer: Self-pay | Admitting: General Practice

## 2021-06-23 ENCOUNTER — Other Ambulatory Visit: Payer: Self-pay | Admitting: Surgery

## 2021-06-23 DIAGNOSIS — Z1231 Encounter for screening mammogram for malignant neoplasm of breast: Secondary | ICD-10-CM

## 2021-06-25 ENCOUNTER — Ambulatory Visit
Admission: RE | Admit: 2021-06-25 | Discharge: 2021-06-25 | Disposition: A | Discharge status: Home / Self Care | Payer: Medicaid Other | Source: Ambulatory Visit | Attending: Surgery | Admitting: Surgery

## 2021-06-25 ENCOUNTER — Other Ambulatory Visit: Payer: Self-pay

## 2021-06-25 DIAGNOSIS — Z1231 Encounter for screening mammogram for malignant neoplasm of breast: Secondary | ICD-10-CM

## 2021-06-28 ENCOUNTER — Other Ambulatory Visit: Payer: Self-pay | Admitting: Surgery

## 2021-06-28 DIAGNOSIS — N644 Mastodynia: Secondary | ICD-10-CM

## 2021-07-07 ENCOUNTER — Other Ambulatory Visit: Payer: Self-pay

## 2021-07-07 ENCOUNTER — Ambulatory Visit
Admission: RE | Admit: 2021-07-07 | Discharge: 2021-07-07 | Disposition: A | Discharge status: Home / Self Care | Payer: Medicaid Other | Source: Ambulatory Visit | Attending: Surgery | Admitting: Surgery

## 2021-07-07 ENCOUNTER — Other Ambulatory Visit: Payer: Self-pay | Admitting: Surgery

## 2021-07-07 DIAGNOSIS — N644 Mastodynia: Secondary | ICD-10-CM

## 2021-07-25 ENCOUNTER — Encounter (HOSPITAL_COMMUNITY): Payer: Self-pay | Admitting: Emergency Medicine

## 2021-07-25 ENCOUNTER — Emergency Department (HOSPITAL_COMMUNITY)
Admission: EM | Admit: 2021-07-25 | Discharge: 2021-07-25 | Disposition: A | Discharge status: Home / Self Care | Payer: 59 | Attending: Emergency Medicine | Admitting: Emergency Medicine

## 2021-07-25 ENCOUNTER — Emergency Department (HOSPITAL_COMMUNITY): Payer: 59

## 2021-07-25 ENCOUNTER — Other Ambulatory Visit: Payer: Self-pay

## 2021-07-25 DIAGNOSIS — Z87891 Personal history of nicotine dependence: Secondary | ICD-10-CM | POA: Insufficient documentation

## 2021-07-25 DIAGNOSIS — Z20822 Contact with and (suspected) exposure to covid-19: Secondary | ICD-10-CM | POA: Insufficient documentation

## 2021-07-25 DIAGNOSIS — R0982 Postnasal drip: Secondary | ICD-10-CM | POA: Diagnosis not present

## 2021-07-25 DIAGNOSIS — R0981 Nasal congestion: Secondary | ICD-10-CM | POA: Insufficient documentation

## 2021-07-25 DIAGNOSIS — R059 Cough, unspecified: Secondary | ICD-10-CM | POA: Diagnosis not present

## 2021-07-25 DIAGNOSIS — R0602 Shortness of breath: Secondary | ICD-10-CM | POA: Diagnosis not present

## 2021-07-25 LAB — COMPREHENSIVE METABOLIC PANEL
ALT: 10 U/L (ref 0–44)
AST: 14 U/L — ABNORMAL LOW (ref 15–41)
Albumin: 4.6 g/dL (ref 3.5–5.0)
Alkaline Phosphatase: 55 U/L (ref 38–126)
Anion gap: 8 (ref 5–15)
BUN: 9 mg/dL (ref 6–20)
CO2: 24 mmol/L (ref 22–32)
Calcium: 9.1 mg/dL (ref 8.9–10.3)
Chloride: 106 mmol/L (ref 98–111)
Creatinine, Ser: 0.72 mg/dL (ref 0.44–1.00)
GFR, Estimated: >60 mL/min (ref >60)
Glucose, Bld: 121 mg/dL — ABNORMAL HIGH (ref 70–99)
Potassium: 3.6 mmol/L (ref 3.5–5.1)
Sodium: 138 mmol/L (ref 135–145)
Total Bilirubin: 0.7 mg/dL (ref 0.3–1.2)
Total Protein: 7.4 g/dL (ref 6.5–8.1)

## 2021-07-25 LAB — CBC WITH DIFFERENTIAL/PLATELET
Abs Immature Granulocytes: 0.04 10*3/uL (ref 0.00–0.07)
Basophils Absolute: 0 10*3/uL (ref 0.0–0.1)
Basophils Relative: 0 %
Eosinophils Absolute: 0.2 10*3/uL (ref 0.0–0.5)
Eosinophils Relative: 2 %
HCT: 44.1 % (ref 36.0–46.0)
Hemoglobin: 14.8 g/dL (ref 12.0–15.0)
Immature Granulocytes: 0 %
Lymphocytes Relative: 26 %
Lymphs Abs: 2.7 10*3/uL (ref 0.7–4.0)
MCH: 28.7 pg (ref 26.0–34.0)
MCHC: 33.6 g/dL (ref 30.0–36.0)
MCV: 85.5 fL (ref 80.0–100.0)
Monocytes Absolute: 0.7 10*3/uL (ref 0.1–1.0)
Monocytes Relative: 6 %
Neutro Abs: 7 10*3/uL (ref 1.7–7.7)
Neutrophils Relative %: 66 %
Platelets: 321 10*3/uL (ref 150–400)
RBC: 5.16 MIL/uL — ABNORMAL HIGH (ref 3.87–5.11)
RDW: 14.1 % (ref 11.5–15.5)
WBC: 10.6 10*3/uL — ABNORMAL HIGH (ref 4.0–10.5)
nRBC: 0 % (ref 0.0–0.2)

## 2021-07-25 LAB — RESP PANEL BY RT-PCR (FLU A&B, COVID) ARPGX2
Influenza A by PCR: NEGATIVE
Influenza B by PCR: NEGATIVE
SARS Coronavirus 2 by RT PCR: NEGATIVE

## 2021-07-25 LAB — I-STAT BETA HCG BLOOD, ED (MC, WL, AP ONLY): I-stat hCG, quantitative: <5 m[IU]/mL (ref <5)

## 2021-07-25 LAB — D-DIMER, QUANTITATIVE: D-Dimer, Quant: 0.52 ug/mL-FEU — ABNORMAL HIGH (ref 0.00–0.50)

## 2021-07-25 LAB — LIPASE, BLOOD: Lipase: 24 U/L (ref 11–51)

## 2021-07-25 MED ORDER — IOHEXOL 350 MG/ML SOLN
80.0000 mL | Freq: Once | INTRAVENOUS | Status: AC | PRN
  Administered 2021-07-25: 80 mL via INTRAVENOUS

## 2021-07-25 MED ORDER — ONDANSETRON HCL 4 MG/2ML IJ SOLN: 4.0000 mg | Freq: Once | INTRAMUSCULAR | Status: DC

## 2021-07-25 MED ORDER — OXYMETAZOLINE HCL 0.05 % NA SOLN
1.0000 | Freq: Once | NASAL | Status: AC
  Administered 2021-07-25: 1 via NASAL
  Filled 2021-07-25: qty 30

## 2021-07-25 MED ORDER — SODIUM CHLORIDE 0.9 % IV BOLUS
1000.0000 mL | Freq: Once | INTRAVENOUS | Status: AC
  Administered 2021-07-25: 1000 mL via INTRAVENOUS

## 2021-07-25 MED ORDER — ONDANSETRON 4 MG PO TBDP: ORAL_TABLET | ORAL | 0 refills | Status: DC

## 2021-07-25 NOTE — ED Notes (Signed)
Second EKG given to Darl Householder, MD

## 2021-07-25 NOTE — ED Notes (Signed)
Light green and lavender top tube sent to lab.

## 2021-07-25 NOTE — ED Provider Notes (Signed)
[facility] DEPT Provider Note   CSN: [id number] Arrival date & time: 07/25/21  1233     History Chief Complaint  Patient presents with   Shortness of Breath   Emesis    Brittany Smith is a 45 y.o. female hx of anxiety, here presenting with shortness of breath and vomiting.  Patient states that she has been having postnasal drip today.  She states that she noticed mucus dripping down her throat and then she swallows it and she vomits it back up.  She has some subjective shortness of breath as well.  Denies any sick contacts with COVID.  Denies any recent travel or history of blood clots.  She took Zyrtec with minimal relief  The history is provided by the patient.      Past Medical History:  Diagnosis Date   Anxiety    Arthritis    Elevated cholesterol    "slightly high" but no meds recc    Pregnancy induced hypertension    Vaginal Pap smear, abnormal    Vitamin D deficiency     Patient Active Problem List   Diagnosis Date Noted   Post-operative state 02/25/2020   Papilloma of both breasts 07/09/2019    Past Surgical History:  Procedure Laterality Date   ANTERIOR CRUCIATE LIGAMENT REPAIR     BILATERAL SALPINGECTOMY     BREAST BIOPSY Right 09/28/2018   x2   BREAST BIOPSY Left 11/07/2018   x2   KNEE ARTHROSCOPY WITH ANTERIOR CRUCIATE LIGAMENT (ACL) REPAIR     KNEE ARTHROSCOPY WITH LATERAL MENISECTOMY Left 02/22/2019   Procedure: KNEE ARTHROSCOPY WITH LATERAL MENISECTOMY AND CHONDROPLASTY;  Surgeon: Earlie Server, MD;  Location: [facility];  Service: Orthopedics;  Laterality: Left;   LAPAROSCOPY     " they scraped some cells "    TUBAL LIGATION     VAGINAL HYSTERECTOMY N/A 02/25/2020   Procedure: HYSTERECTOMY VAGINAL;  Surgeon: Chancy Milroy, MD;  Location: [place];  Service: Gynecology;  Laterality: N/A;     OB History     Gravida  3   Para  1   Term      Preterm  1   AB  1   Living  2       SAB      IAB      Ectopic  1   Multiple      Live Births  2           Family History  Problem Relation Age of Onset   Healthy Mother    Healthy Father     Social History   Tobacco Use   Smoking status: Former    Types: Cigarettes    Quit date: 10/05/2012    Years since quitting: 8.8   Smokeless tobacco: Never  Vaping Use   Vaping Use: Never used  Substance Use Topics   Alcohol use: Yes    Comment: occasionally- weekends   Drug use: No    Home Medications Prior to Admission medications   Medication Sig Start Date End Date Taking? Authorizing Provider  fexofenadine (ALLEGRA) 180 MG tablet Take 180 mg by mouth daily.    [provider]  ibuprofen (ADVIL) 800 MG tablet Take 1 tablet (800 mg total) by mouth 3 (three) times daily. 02/26/20   Chancy Milroy, MD  levocetirizine (XYZAL) 5 MG tablet Take 5 mg by mouth at bedtime.    [provider]  MELATONIN PO Take 1  tablet by mouth at bedtime as needed (sleep).    [provider]    Allergies    Ceftriaxone and Tramadol  Review of Systems   Review of Systems  Respiratory:  Positive for shortness of breath.   All other systems reviewed and are negative.  Physical Exam Updated Vital Signs BP 127/79   Pulse 62   Temp 98.2 F (36.8 C) (Oral)   Resp 15   Ht 5' 10.5" (1.791 m)   Wt 93 kg   LMP 01/22/2020 (Exact Date)   SpO2 100%   BMI 29.00 kg/m   Physical Exam Vitals and nursing note reviewed.  Constitutional:      Comments: Coughing, slightly uncomfortable  HENT:     Head: Normocephalic.     Mouth/Throat:     Mouth: Mucous membranes are moist.  Eyes:     Extraocular Movements: Extraocular movements intact.     Pupils: Pupils are equal, round, and reactive to light.  Cardiovascular:     Rate and Rhythm: Normal rate and regular rhythm.  Pulmonary:     Comments: Slightly tachypneic, no wheezing or crackles Abdominal:     Palpations: Abdomen is soft.  Musculoskeletal:         General: Normal range of motion.     Cervical back: Normal range of motion and neck supple.  Skin:    General: Skin is warm.     Capillary Refill: Capillary refill takes less than 2 seconds.  Neurological:     General: No focal deficit present.     Mental Status: She is oriented to person, place, and time.  Psychiatric:        Mood and Affect: Mood normal.        Behavior: Behavior normal.    ED Results / Procedures / Treatments   Labs (all labs ordered are listed, but only abnormal results are displayed) Labs Reviewed  CBC WITH DIFFERENTIAL/PLATELET - Abnormal; Notable for the following components:      Result Value   WBC 10.6 (*)    RBC 5.16 (*)    All other components within normal limits  COMPREHENSIVE METABOLIC PANEL - Abnormal; Notable for the following components:   Glucose, Bld 121 (*)    AST 14 (*)    All other components within normal limits  D-DIMER, QUANTITATIVE (NOT AT [facility]) - Abnormal; Notable for the following components:   D-Dimer, Quant 0.52 (*)    All other components within normal limits  RESP PANEL BY RT-PCR (FLU A&B, COVID) ARPGX2  LIPASE, BLOOD  I-STAT BETA HCG BLOOD, ED ([facility], [facility], [facility] ONLY)    EKG EKG Interpretation  Date/Time:  Sunday July 25 2021 17:38:49 EDT Ventricular Rate:  61 PR Interval:  152 QRS Duration: 83 QT Interval:  419 QTC Calculation: 422 R Axis:   71 Text Interpretation: Sinus rhythm No significant change since last tracing Confirmed by Wandra Arthurs [phone]) on 07/25/2021 6:13:35 PM  Radiology DG Neck Soft Tissue  Result Date: 07/25/2021 CLINICAL DATA:  Cough and difficulty swallowing. EXAM: NECK SOFT TISSUES - 1+ VIEW COMPARISON:  None. FINDINGS: There is no evidence of retropharyngeal soft tissue swelling or epiglottic enlargement. The cervical airway is unremarkable and no radio-opaque foreign body identified. Mild degenerative changes in the LOWER cervical spine. IMPRESSION: Negative. Electronically Signed   By:  Nolon Nations M.D.   On: 07/25/2021 14:24   DG Chest 2 View  Result Date: 07/25/2021 CLINICAL DATA:  Cough. Difficulty swallowing. Chest pain. Shortness  of breath. EXAM: CHEST - 2 VIEW COMPARISON:  04/06/2018 FINDINGS: The heart size and mediastinal contours are within normal limits. Both lungs are clear. The visualized skeletal structures are unremarkable. IMPRESSION: No active cardiopulmonary disease. Electronically Signed   By: Nolon Nations M.D.   On: 07/25/2021 14:23   CT Angio Chest PE W and/or Wo Contrast  Result Date: 07/25/2021 CLINICAL DATA:  PE suspected, chest pain and shortness of breath EXAM: CT ANGIOGRAPHY CHEST WITH CONTRAST TECHNIQUE: Multidetector CT imaging of the chest was performed using the standard protocol during bolus administration of intravenous contrast. Multiplanar CT image reconstructions and MIPs were obtained to evaluate the vascular anatomy. CONTRAST:  62mL OMNIPAQUE IOHEXOL 350 MG/ML SOLN COMPARISON:  No prior CT chest FINDINGS: Cardiovascular: Satisfactory opacification of the pulmonary arteries to the segmental level. No evidence of pulmonary embolism. Normal heart size. No pericardial effusion. Mediastinum/Nodes: No enlarged mediastinal, hilar, or axillary lymph nodes. Thyroid gland, trachea, and esophagus demonstrate no significant findings. Lungs/Pleura: Lungs are clear. No pleural effusion or pneumothorax. Upper Abdomen: No acute abnormality. Musculoskeletal: No chest wall abnormality. No acute or significant osseous findings. Review of the MIP images confirms the above findings. IMPRESSION: Negative for pulmonary embolism.  No acute process in the chest. Electronically Signed   By: Merilyn Baba M.D.   On: 07/25/2021 20:32    Procedures Procedures   Medications Ordered in ED Medications  sodium chloride 0.9 % bolus 1,000 mL (1,000 mLs Intravenous New Bag/Given 07/25/21 1858)  oxymetazoline (AFRIN) 0.05 % nasal spray 1 spray (1 spray Each Nare Given  07/25/21 1850)  iohexol (OMNIPAQUE) 350 MG/ML injection 80 mL (80 mLs Intravenous Contrast Given 07/25/21 2018)    ED Course  I have reviewed the triage vital signs and the nursing notes.  Pertinent labs & imaging results that were available during my care of the patient were reviewed by me and considered in my medical decision making (see chart for details).    MDM Rules/Calculators/A&P                           Sharnell Knight is a 45 y.o. female here presenting with cough and postnasal drip and posttussive vomiting.  I think likely seasonal allergies versus bronchitis versus pneumonia versus COVID versus PE.  Will get D-dimer and labs and hydrate patient  9:00 PM D-dimer slightly elevated.  CTA did not show any PE.  COVID test is negative.  Patient felt better after Afrin.  Stable for discharge.  We will have her continue Zyrtec as needed.  Final Clinical Impression(s) / ED Diagnoses Final diagnoses:  None    Rx / DC Orders ED Discharge Orders     None        Drenda Freeze, MD 07/25/21 2100

## 2021-07-25 NOTE — ED Provider Notes (Signed)
Emergency Medicine Provider Triage Evaluation Note  Brittany Smith , a 45 y.o. female  was evaluated in triage.  Pt complains of difficulty swallowing, chest pain, and shortness of breath since this morning.  She normally has a hard time with mucus in the morning which is typically resolve with Zyrtec.  She took her Zyrtec and it did not improve her symptoms this time.  She denies fever, chills, abdominal pain, diarrhea.  Tested negative for COVID yesterday.  Review of Systems  Positive:  Negative: See above   Physical Exam  BP 114/86 (BP Location: Right Arm)   Pulse 78   Temp 98.1 F (36.7 C) (Oral)   Resp 18   SpO2 99%  Gen:   Awake, no distress   Resp:  Normal effort  MSK:   Moves extremities without difficulty  Other:  Tearful and appears uncomfortable.  Coughing on exam.  Medical Decision Making  Medically screening exam initiated at 12:55 PM.  Appropriate orders placed.  Doralee Albino was informed that the remainder of the evaluation will be completed by another provider, this initial triage assessment does not replace that evaluation, and the importance of remaining in the ED until their evaluation is complete.  Vital signs are stable.  She has been 100% on room air.  No tachycardia.  Seems to be transport dysphagia.   Myna Bright [place], PA-C 07/25/21 1256    Lajean Saver, MD 07/27/21 [phone]

## 2021-07-25 NOTE — Discharge Instructions (Addendum)
Your labs and your imaging are reassuring right now. I think your symptoms are from congestion.  Use Afrin 4 times a day for congestion.  You may continue zyrtec   Take Zofran as needed for nausea  Stay hydrated  See your doctor for follow-up  Return to ER if you have worse congestion, trouble breathing, vomiting, dehydration

## 2021-07-25 NOTE — ED Triage Notes (Signed)
States she can swallow, has been coughing up mucus, dry heaves and nausea. Took a Zyrtec this morning, normally takes it at night. States her mucus 'overtook' her, now it hurts to take a deep breath.

## 2022-01-05 ENCOUNTER — Other Ambulatory Visit: Payer: Medicaid Other

## 2022-02-03 ENCOUNTER — Emergency Department (HOSPITAL_BASED_OUTPATIENT_CLINIC_OR_DEPARTMENT_OTHER)
Admission: EM | Admit: 2022-02-03 | Discharge: 2022-02-03 | Disposition: A | Discharge status: Home / Self Care | Payer: 59 | Attending: Emergency Medicine | Admitting: Emergency Medicine

## 2022-02-03 ENCOUNTER — Encounter (HOSPITAL_BASED_OUTPATIENT_CLINIC_OR_DEPARTMENT_OTHER): Payer: Self-pay

## 2022-02-03 ENCOUNTER — Emergency Department (HOSPITAL_BASED_OUTPATIENT_CLINIC_OR_DEPARTMENT_OTHER): Payer: 59

## 2022-02-03 ENCOUNTER — Other Ambulatory Visit: Payer: Self-pay

## 2022-02-03 DIAGNOSIS — R109 Unspecified abdominal pain: Secondary | ICD-10-CM | POA: Diagnosis present

## 2022-02-03 DIAGNOSIS — R1031 Right lower quadrant pain: Secondary | ICD-10-CM | POA: Diagnosis not present

## 2022-02-03 DIAGNOSIS — R110 Nausea: Secondary | ICD-10-CM | POA: Insufficient documentation

## 2022-02-03 LAB — URINALYSIS, ROUTINE W REFLEX MICROSCOPIC
Bilirubin Urine: NEGATIVE
Glucose, UA: NEGATIVE mg/dL
Hgb urine dipstick: NEGATIVE
Ketones, ur: NEGATIVE mg/dL
Leukocytes,Ua: NEGATIVE
Nitrite: NEGATIVE
Protein, ur: NEGATIVE mg/dL
Specific Gravity, Urine: 1.025 (ref 1.005–1.030)
pH: 7 (ref 5.0–8.0)

## 2022-02-03 LAB — COMPREHENSIVE METABOLIC PANEL
ALT: 12 U/L (ref 0–44)
AST: 18 U/L (ref 15–41)
Albumin: 4.3 g/dL (ref 3.5–5.0)
Alkaline Phosphatase: 63 U/L (ref 38–126)
Anion gap: 7 (ref 5–15)
BUN: 13 mg/dL (ref 6–20)
CO2: 22 mmol/L (ref 22–32)
Calcium: 9.2 mg/dL (ref 8.9–10.3)
Chloride: 106 mmol/L (ref 98–111)
Creatinine, Ser: 0.77 mg/dL (ref 0.44–1.00)
GFR, Estimated: >60 mL/min (ref >60)
Glucose, Bld: 103 mg/dL — ABNORMAL HIGH (ref 70–99)
Potassium: 4.1 mmol/L (ref 3.5–5.1)
Sodium: 135 mmol/L (ref 135–145)
Total Bilirubin: 0.4 mg/dL (ref 0.3–1.2)
Total Protein: 7.3 g/dL (ref 6.5–8.1)

## 2022-02-03 LAB — LIPASE, BLOOD: Lipase: 29 U/L (ref 11–51)

## 2022-02-03 LAB — CBC
HCT: 41.2 % (ref 36.0–46.0)
Hemoglobin: 14.4 g/dL (ref 12.0–15.0)
MCH: 28.8 pg (ref 26.0–34.0)
MCHC: 35 g/dL (ref 30.0–36.0)
MCV: 82.4 fL (ref 80.0–100.0)
Platelets: 292 10*3/uL (ref 150–400)
RBC: 5 MIL/uL (ref 3.87–5.11)
RDW: 13.9 % (ref 11.5–15.5)
WBC: 10.6 10*3/uL — ABNORMAL HIGH (ref 4.0–10.5)
nRBC: 0 % (ref 0.0–0.2)

## 2022-02-03 LAB — PREGNANCY, URINE: Preg Test, Ur: NEGATIVE

## 2022-02-03 MED ORDER — ONDANSETRON 4 MG PO TBDP: ORAL_TABLET | ORAL | 0 refills | Status: DC

## 2022-02-03 MED ORDER — DICYCLOMINE HCL 20 MG PO TABS: 20.0000 mg | ORAL_TABLET | Freq: Two times a day (BID) | ORAL | 0 refills | Status: DC

## 2022-02-03 MED ORDER — IOHEXOL 300 MG/ML  SOLN
100.0000 mL | Freq: Once | INTRAMUSCULAR | Status: AC | PRN
  Administered 2022-02-03: 100 mL via INTRAVENOUS

## 2022-02-03 MED ORDER — SODIUM CHLORIDE 0.9 % IV BOLUS: 1000.0000 mL | Freq: Once | INTRAVENOUS | Status: DC

## 2022-02-03 MED ORDER — MORPHINE SULFATE (PF) 4 MG/ML IV SOLN
4.0000 mg | Freq: Once | INTRAVENOUS | Status: AC
  Administered 2022-02-03: 4 mg via INTRAVENOUS
  Filled 2022-02-03: qty 1

## 2022-02-03 MED ORDER — ONDANSETRON HCL 4 MG/2ML IJ SOLN
4.0000 mg | Freq: Once | INTRAMUSCULAR | Status: AC
  Administered 2022-02-03: 4 mg via INTRAVENOUS
  Filled 2022-02-03: qty 2

## 2022-02-03 NOTE — ED Triage Notes (Signed)
Patient states around 8 am she started having "hot flashes" and through the day she has been feeling pain targeting her RLQ.  ?

## 2022-02-03 NOTE — ED Notes (Signed)
Patient denies any chest pain,sob  ?

## 2022-02-03 NOTE — ED Provider Notes (Signed)
?[facility] EMERGENCY DEPARTMENT ?Provider Note ? ? ?CSN: [id number] ?Arrival date & time: 02/03/22  1640 ? ?  ? ?History ? ?Chief Complaint  ?Patient presents with  ? Abdominal Pain  ? ? ?Myley Bahner is a 46 y.o. female. ? ?46 yo  F with a cc of of abdominal discomfort.  Started this morning.  Right-sided and crampy.  Having some nausea but no vomiting.  Has been able to eat and drink without issue.  Denies any urinary symptoms.  Has a history of a hysterectomy.  Denies vaginal bleeding or discharge.  Denies trauma.  Denies diarrhea. ? ? ?Abdominal Pain ? ?  ? ?Home Medications ?Prior to Admission medications   ?Medication Sig Start Date End Date Taking? Authorizing Provider  ?dicyclomine (BENTYL) 20 MG tablet Take 1 tablet (20 mg total) by mouth 2 (two) times daily. 02/03/22  Yes Deno Etienne, DO  ?fexofenadine (ALLEGRA) 180 MG tablet Take 180 mg by mouth daily.   Yes [provider]  ?ibuprofen (ADVIL) 800 MG tablet Take 1 tablet (800 mg total) by mouth 3 (three) times daily. 02/26/20  Yes Chancy Milroy, MD  ?levocetirizine (XYZAL) 5 MG tablet Take 5 mg by mouth at bedtime.   Yes [provider]  ?MELATONIN PO Take 1 tablet by mouth at bedtime as needed (sleep).   Yes [provider]  ?ondansetron (ZOFRAN-ODT) 4 MG disintegrating tablet '4mg'$  ODT q4 hours prn nausea/vomit 02/03/22  Yes Deno Etienne, DO  ?   ? ?Allergies    ?Ceftriaxone and Tramadol   ? ?Review of Systems   ?Review of Systems  ?Gastrointestinal:  Positive for abdominal pain.  ? ?Physical Exam ?Updated Vital Signs ?BP 130/81   Pulse 81   Temp 97.8 ?F (36.6 ?C) (Oral)   Resp 16   Ht '5\' 11"'$  (1.803 m)   Wt 88.9 kg   LMP 01/22/2020 (Exact Date)   SpO2 100%   BMI 27.34 kg/m?  ?Physical Exam ?Vitals and nursing note reviewed.  ?Constitutional:   ?   General: She is not in acute distress. ?   Appearance: She is well-developed. She is not diaphoretic.  ?HENT:  ?   Head: Normocephalic and atraumatic.  ?Eyes:  ?    Pupils: Pupils are equal, round, and reactive to light.  ?Cardiovascular:  ?   Rate and Rhythm: Normal rate and regular rhythm.  ?   Heart sounds: No murmur heard. ?  No friction rub. No gallop.  ?Pulmonary:  ?   Effort: Pulmonary effort is normal.  ?   Breath sounds: No wheezing or rales.  ?Abdominal:  ?   General: There is no distension.  ?   Palpations: Abdomen is soft.  ?   Tenderness: There is abdominal tenderness.  ?   Comments: Focal pain to the RLQ.  No rebound, no guarding  ?Musculoskeletal:     ?   General: No tenderness.  ?   Cervical back: Normal range of motion and neck supple.  ?Skin: ?   General: Skin is warm and dry.  ?Neurological:  ?   Mental Status: She is alert and oriented to person, place, and time.  ?Psychiatric:     ?   Behavior: Behavior normal.  ? ? ?ED Results / Procedures / Treatments   ?Labs ?(all labs ordered are listed, but only abnormal results are displayed) ?Labs Reviewed  ?COMPREHENSIVE METABOLIC PANEL - Abnormal; Notable for the following components:  ?    Result Value  ?  Glucose, Bld 103 (*)   ? All other components within normal limits  ?CBC - Abnormal; Notable for the following components:  ? WBC 10.6 (*)   ? All other components within normal limits  ?LIPASE, BLOOD  ?URINALYSIS, ROUTINE W REFLEX MICROSCOPIC  ?PREGNANCY, URINE  ? ? ?EKG ?EKG Interpretation ? ?Date/Time:  Thursday Feb 03 2022 16:50:15 EDT ?Ventricular Rate:  76 ?PR Interval:  135 ?QRS Duration: 86 ?QT Interval:  397 ?QTC Calculation: 447 ?R Axis:   66 ?Text Interpretation: Sinus rhythm No significant change since last tracing Confirmed by Deno Etienne [phone]) on 02/03/2022 5:09:06 PM ? ?Radiology ?CT ABDOMEN PELVIS W CONTRAST ? ?Result Date: 02/03/2022 ?CLINICAL DATA:  Right lower quadrant abdominal pain. EXAM: CT ABDOMEN AND PELVIS WITH CONTRAST TECHNIQUE: Multidetector CT imaging of the abdomen and pelvis was performed using the standard protocol following bolus administration of intravenous contrast. RADIATION  DOSE REDUCTION: This exam was performed according to the departmental dose-optimization program which includes automated exposure control, adjustment of the mA and/or kV according to patient size and/or use of iterative reconstruction technique. CONTRAST:  161m OMNIPAQUE IOHEXOL 300 MG/ML  SOLN COMPARISON:  06/16/2019 FINDINGS: Lower chest: Mild scarring at the left lung base. No active process. Hepatobiliary: Liver parenchyma is normal.  No calcified gallstones. Pancreas: Normal Spleen: Normal Adrenals/Urinary Tract: Adrenal glands are normal. Kidneys are normal. Bladder is normal. Stomach/Bowel: Stomach and small intestine are normal. Normal appendix. The colon is normal. Vascular/Lymphatic: The aorta and IVC are normal.  No adenopathy. Reproductive: Previous hysterectomy. No ovarian or adnexal abnormality on the right. Several small cysts of the left ovary, the largest measuring 3.1 cm. No follow-up imaging recommended. Other: No free fluid or air. Musculoskeletal: Normal IMPRESSION: No abnormality seen to explain right lower quadrant pain. Normal appearing appendix. No other abnormal bowel finding. No solid organ pathology. None previous hysterectomy. Electronically Signed   By: MNelson ChimesM.D.   On: 02/03/2022 18:26   ? ?Procedures ?Procedures  ? ? ?Medications Ordered in ED ?Medications  ?sodium chloride 0.9 % bolus 1,000 mL (0 mLs Intravenous Stopped 02/03/22 1724)  ?morphine (PF) 4 MG/ML injection 4 mg (4 mg Intravenous Given 02/03/22 1726)  ?ondansetron (ZOFRAN) injection 4 mg (4 mg Intravenous Given 02/03/22 1730)  ?iohexol (OMNIPAQUE) 300 MG/ML solution 100 mL (100 mLs Intravenous Contrast Given 02/03/22 1802)  ? ? ?ED Course/ Medical Decision Making/ A&P ?  ?                        ?Medical Decision Making ?Amount and/or Complexity of Data Reviewed ?Labs: ordered. ?Radiology: ordered. ? ?Risk ?Prescription drug management. ? ? ?46yo F with a chief complaints of abdominal pain.  This has been going on since  this morning.  Patient has focal right lower quadrant tenderness on my exam.  Will obtain a CT scan to assess for acute appendicitis.  Blood work UA treat pain and nausea reassess. ? ?LFT unremarkable, lipase negative, very mild leukocytosis at 10.6.  UA negative for infection.  CT scan of the abdomen pelvis negative for acute intra abdominal pathology.  Specifically no appendicitis seen.  Discussed results with patient.  Trial of Bentyl and antiemetics.  PCP follow-up. ? ?7:10 PM:  I have discussed the diagnosis/risks/treatment options with the patient.  Evaluation and diagnostic testing in the emergency department does not suggest an emergent condition requiring admission or immediate intervention beyond what has been performed at this time.  They will follow  up with  PCP. We also discussed returning to the ED immediately if new or worsening sx occur. We discussed the sx which are most concerning (e.g., sudden worsening pain, fever, inability to tolerate by mouth) that necessitate immediate return. Medications administered to the patient during their visit and any new prescriptions provided to the patient are listed below. ? ?Medications given during this visit ?Medications  ?sodium chloride 0.9 % bolus 1,000 mL (0 mLs Intravenous Stopped 02/03/22 1724)  ?morphine (PF) 4 MG/ML injection 4 mg (4 mg Intravenous Given 02/03/22 1726)  ?ondansetron (ZOFRAN) injection 4 mg (4 mg Intravenous Given 02/03/22 1730)  ?iohexol (OMNIPAQUE) 300 MG/ML solution 100 mL (100 mLs Intravenous Contrast Given 02/03/22 1802)  ? ? ? ?The patient appears reasonably screen and/or stabilized for discharge and I doubt any other medical condition or other [facility] requiring further screening, evaluation, or treatment in the ED at this time prior to discharge.  ? ? ? ? ? ? ? ? ?Final Clinical Impression(s) / ED Diagnoses ?Final diagnoses:  ?Right lower quadrant abdominal pain  ? ? ?Rx / DC Orders ?ED Discharge Orders   ? ?      Ordered  ?  dicyclomine  (BENTYL) 20 MG tablet  2 times daily       ? 02/03/22 1855  ?  ondansetron (ZOFRAN-ODT) 4 MG disintegrating tablet       ? 02/03/22 1855  ? ?  ?  ? ?  ? ? ?  ?Deno Etienne, DO ?02/03/22 1910 ? ?

## 2022-02-03 NOTE — Discharge Instructions (Addendum)
Follow-up with your family doctor.  Return for worsening pain fever or inability eat or drink. ?

## 2022-04-10 ENCOUNTER — Emergency Department (HOSPITAL_BASED_OUTPATIENT_CLINIC_OR_DEPARTMENT_OTHER)
Admission: EM | Admit: 2022-04-10 | Discharge: 2022-04-10 | Disposition: A | Discharge status: Home / Self Care | Payer: 59 | Attending: Emergency Medicine | Admitting: Emergency Medicine

## 2022-04-10 ENCOUNTER — Emergency Department (HOSPITAL_BASED_OUTPATIENT_CLINIC_OR_DEPARTMENT_OTHER): Payer: 59

## 2022-04-10 ENCOUNTER — Encounter (HOSPITAL_BASED_OUTPATIENT_CLINIC_OR_DEPARTMENT_OTHER): Payer: Self-pay | Admitting: Emergency Medicine

## 2022-04-10 ENCOUNTER — Other Ambulatory Visit: Payer: Self-pay

## 2022-04-10 DIAGNOSIS — S8991XA Unspecified injury of right lower leg, initial encounter: Secondary | ICD-10-CM | POA: Diagnosis present

## 2022-04-10 DIAGNOSIS — S8391XA Sprain of unspecified site of right knee, initial encounter: Secondary | ICD-10-CM

## 2022-04-10 DIAGNOSIS — X509XXA Other and unspecified overexertion or strenuous movements or postures, initial encounter: Secondary | ICD-10-CM | POA: Insufficient documentation

## 2022-04-10 MED ORDER — NAPROXEN 375 MG PO TABS: 375.0000 mg | ORAL_TABLET | Freq: Two times a day (BID) | ORAL | 0 refills | Status: DC

## 2022-04-10 NOTE — ED Triage Notes (Signed)
Pt reports R knee pain since yesterday. Pain began when pt was getting up from the floor and knee felt like it dislocated. Pt ambulatory. Pain worse with turning.

## 2022-04-10 NOTE — Discharge Instructions (Signed)
Use the crutches and knee immobilizer for comfort.  Follow-up with your orthopedic doctor for further evaluation if the symptoms persist

## 2022-04-10 NOTE — ED Notes (Signed)
X-ray at bedside

## 2022-04-10 NOTE — ED Provider Notes (Addendum)
[facility] EMERGENCY DEPARTMENT Provider Note   CSN: [id number] Arrival date & time: 04/10/22  1009     History  Chief Complaint  Patient presents with   Knee Pain    Brittany Smith is a 46 y.o. female.   Knee Pain   Patient presents to the ED with complaints of a knee injury that occurred yesterday.  Patient states she was playing on the floor with her granddaughter.  When she went to get up she felt like something popped out of place.  Patient has noted some continued pain and discomfort since that time.  It hurts to bend her knee.  Home Medications Prior to Admission medications   Medication Sig Start Date End Date Taking? Authorizing Provider  naproxen (NAPROSYN) 375 MG tablet Take 1 tablet (375 mg total) by mouth 2 (two) times daily. 04/10/22  Yes Dorie Rank, MD  dicyclomine (BENTYL) 20 MG tablet Take 1 tablet (20 mg total) by mouth 2 (two) times daily. 02/03/22   Deno Etienne, DO  fexofenadine (ALLEGRA) 180 MG tablet Take 180 mg by mouth daily.    [provider]  ibuprofen (ADVIL) 800 MG tablet Take 1 tablet (800 mg total) by mouth 3 (three) times daily. 02/26/20   Chancy Milroy, MD  levocetirizine (XYZAL) 5 MG tablet Take 5 mg by mouth at bedtime.    [provider]  MELATONIN PO Take 1 tablet by mouth at bedtime as needed (sleep).    [provider]  ondansetron (ZOFRAN-ODT) 4 MG disintegrating tablet '4mg'$  ODT q4 hours prn nausea/vomit 02/03/22   Deno Etienne, DO      Allergies    Ceftriaxone and Tramadol    Review of Systems   Review of Systems  Physical Exam Updated Vital Signs BP 111/67 (BP Location: Right Arm)   Pulse 76   Temp 97.7 F (36.5 C) (Oral)   Resp 20   Ht 1.778 m ('5\' 10"'$ )   Wt 90.7 kg   LMP 01/22/2020 (Exact Date)   SpO2 97%   BMI 28.70 kg/m  Physical Exam Vitals and nursing note reviewed.  Constitutional:      General: She is not in acute distress.    Appearance: She is well-developed.  HENT:      Head: Normocephalic and atraumatic.     Right Ear: External ear normal.     Left Ear: External ear normal.  Eyes:     General: No scleral icterus.       Right eye: No discharge.        Left eye: No discharge.     Conjunctiva/sclera: Conjunctivae normal.  Neck:     Trachea: No tracheal deviation.  Cardiovascular:     Rate and Rhythm: Normal rate.  Pulmonary:     Effort: Pulmonary effort is normal. No respiratory distress.     Breath sounds: No stridor.  Abdominal:     General: There is no distension.  Musculoskeletal:        General: No swelling or deformity.     Cervical back: Neck supple.     Right knee: No deformity or effusion. Tenderness present.     Comments: Patient able to hold lower leg extended off the bed  Skin:    General: Skin is warm and dry.     Findings: No rash.  Neurological:     Mental Status: She is alert.     Cranial Nerves: Cranial nerve deficit: no gross deficits.     ED  Results / Procedures / Treatments   Labs (all labs ordered are listed, but only abnormal results are displayed) Labs Reviewed - No data to display  EKG None  Radiology DG Knee Complete 4 Views Right  Result Date: 04/10/2022 CLINICAL DATA:  Right knee pain EXAM: RIGHT KNEE - COMPLETE 4+ VIEW COMPARISON:  None Available. FINDINGS: There is no evidence of acute fracture. Alignment is normal. There is a trace joint effusion. IMPRESSION: No evidence of acute fracture.  Trace joint effusion. Electronically Signed   By: Maurine Simmering M.D.   On: 04/10/2022 10:57    Procedures Procedures    Medications Ordered in ED Medications - No data to display  ED Course/ Medical Decision Making/ A&P                           Medical Decision Making Problems Addressed: Sprain of right knee, unspecified ligament, initial encounter: acute illness or injury that poses a threat to life or bodily functions  Amount and/or Complexity of Data Reviewed Radiology: ordered and independent interpretation  performed.  Risk Prescription drug management.  Patient presented to the ED for evaluation of a knee injury.  X-rays do not show any signs of fracture or dislocation.  There might be a small effusion.  Possible ligamentous injury, sprain.  We will place in a knee immobilizer.  Crutches provided.  Patient had previous ACL surgery on her other knee.  She has seen [facility] in the past.  Encouraged outpatient follow-up with        Final Clinical Impression(s) / ED Diagnoses Final diagnoses:  Sprain of right knee, unspecified ligament, initial encounter    Rx / DC Orders ED Discharge Orders          Ordered    naproxen (NAPROSYN) 375 MG tablet  2 times daily        04/10/22 1141              Dorie Rank, MD 04/10/22 1141    Dorie Rank, MD 04/10/22 1141

## 2022-06-21 ENCOUNTER — Other Ambulatory Visit: Payer: Self-pay | Admitting: Surgery

## 2022-06-21 DIAGNOSIS — N644 Mastodynia: Secondary | ICD-10-CM

## 2022-06-24 ENCOUNTER — Other Ambulatory Visit: Payer: Self-pay | Admitting: Surgery

## 2022-06-24 DIAGNOSIS — R928 Other abnormal and inconclusive findings on diagnostic imaging of breast: Secondary | ICD-10-CM

## 2022-07-29 ENCOUNTER — Other Ambulatory Visit: Payer: 59

## 2022-07-29 ENCOUNTER — Ambulatory Visit
Admission: RE | Admit: 2022-07-29 | Discharge: 2022-07-29 | Disposition: A | Discharge status: Home / Self Care | Payer: Medicaid Other | Source: Ambulatory Visit | Attending: Surgery | Admitting: Surgery

## 2022-07-29 ENCOUNTER — Ambulatory Visit
Admission: RE | Admit: 2022-07-29 | Discharge: 2022-07-29 | Disposition: A | Discharge status: Home / Self Care | Payer: 59 | Source: Ambulatory Visit | Attending: Surgery | Admitting: Surgery

## 2022-07-29 DIAGNOSIS — N644 Mastodynia: Secondary | ICD-10-CM

## 2022-07-29 DIAGNOSIS — R928 Other abnormal and inconclusive findings on diagnostic imaging of breast: Secondary | ICD-10-CM

## 2022-08-31 ENCOUNTER — Telehealth: Payer: Self-pay | Admitting: Family Medicine

## 2022-08-31 NOTE — Telephone Encounter (Signed)
Pt called at 4:15pm on 08/31/22 to say she has a fever and will not be able to come in for her [facility] patient [facility] appt.

## 2022-09-01 ENCOUNTER — Ambulatory Visit: Payer: 59 | Admitting: Family Medicine

## 2022-09-02 NOTE — Telephone Encounter (Signed)
Ok.  Pt can reschedule when feeling better.

## 2022-09-05 NOTE — Telephone Encounter (Signed)
Thank you :)

## 2022-11-16 ENCOUNTER — Ambulatory Visit: Payer: Medicaid Other | Admitting: Obstetrics and Gynecology

## 2022-11-26 ENCOUNTER — Encounter (HOSPITAL_COMMUNITY): Payer: Self-pay

## 2022-11-26 ENCOUNTER — Ambulatory Visit (HOSPITAL_COMMUNITY)
Admission: EM | Admit: 2022-11-26 | Discharge: 2022-11-26 | Disposition: A | Discharge status: Home / Self Care | Payer: Medicaid Other | Attending: Family Medicine | Admitting: Family Medicine

## 2022-11-26 DIAGNOSIS — R202 Paresthesia of skin: Secondary | ICD-10-CM | POA: Diagnosis not present

## 2022-11-26 DIAGNOSIS — M79642 Pain in left hand: Secondary | ICD-10-CM

## 2022-11-26 DIAGNOSIS — M79641 Pain in right hand: Secondary | ICD-10-CM

## 2022-11-26 LAB — CBG MONITORING, ED: Glucose-Capillary: 133 mg/dL — ABNORMAL HIGH (ref 70–99)

## 2022-11-26 MED ORDER — KETOROLAC TROMETHAMINE 60 MG/2ML IM SOLN
INTRAMUSCULAR | Status: AC
  Filled 2022-11-26: qty 2

## 2022-11-26 MED ORDER — HYDROCODONE-ACETAMINOPHEN 5-325 MG PO TABS
1.0000 | ORAL_TABLET | Freq: Once | ORAL | Status: AC
  Administered 2022-11-26: 1 via ORAL

## 2022-11-26 MED ORDER — KETOROLAC TROMETHAMINE 60 MG/2ML IM SOLN
60.0000 mg | Freq: Once | INTRAMUSCULAR | Status: AC
  Administered 2022-11-26: 60 mg via INTRAMUSCULAR

## 2022-11-26 MED ORDER — PREDNISONE 10 MG (48) PO TBPK: ORAL_TABLET | ORAL | 0 refills | Status: DC

## 2022-11-26 MED ORDER — HYDROCODONE-ACETAMINOPHEN 5-325 MG PO TABS
ORAL_TABLET | ORAL | Status: AC
  Filled 2022-11-26: qty 1

## 2022-11-26 NOTE — ED Triage Notes (Signed)
Pt states for the past 4 days her right arm started to hurt from her elbow down and her right hand feels numb and tingling. States this morning her left hand started to feel numb and tingling. States she had this same thing about 2 years ago.

## 2022-11-26 NOTE — Discharge Instructions (Addendum)
Meds ordered this encounter  Medications   ketorolac (TORADOL) injection 60 mg   HYDROcodone-acetaminophen (NORCO/VICODIN) 5-325 MG per tablet 1 tablet   predniSONE (STERAPRED UNI-PAK 48 TAB) 10 MG (48) TBPK tablet    Sig: Take as directed.    Dispense:  48 tablet    Refill:  0

## 2022-11-30 NOTE — ED Provider Notes (Signed)
[facility]   [id number] 11/26/22 Arrival Time: [id number]  ASSESSMENT & PLAN:  1. Paresthesias in left hand   2. Paresthesias in right hand   3. Bilateral hand pain    Normal neurologic exam. No suspicion for ICH or SAH. No indication for neurodiagnostic imaging at this time. Discussed.  Labs Reviewed  CBG MONITORING, ED - Abnormal; Notable for the following components:      Result Value   Glucose-Capillary 133 (*)    All other components within normal limits   Declines further lab work. Carpal-tunnel like. Given left wrist splint. Has right at home. To wear until orthopaedic f/u.  Follow-up Information     Schedule an appointment as soon as possible for a visit  with [facility].   Specialty: Specialist Contact information: [address] [place] [address] [phone]                 Meds ordered this encounter  Medications   ketorolac (TORADOL) injection 60 mg   HYDROcodone-acetaminophen (NORCO/VICODIN) 5-325 MG per tablet 1 tablet   predniSONE (STERAPRED UNI-PAK 48 TAB) 10 MG (48) TBPK tablet    Sig: Take as directed.    Dispense:  48 tablet    Refill:  0   Work note given.  Reviewed expectations re: course of current medical issues. Questions answered. Outlined signs and symptoms indicating need for more acute intervention. Patient verbalized understanding. After Visit Summary given.   SUBJECTIVE:  Brittany Smith is a 47 y.o. female who reports that over the past 4 days her right arm started to hurt from her elbow down and her right hand feels numb and tingling. States this morning her left hand started to feel numb and tingling. States she had this same thing about 2 years ago. Has worn wrist splint in the past that helps. Able to perform ADL. No medications PTA.  Social History   Substance and Sexual Activity  Alcohol Use Yes   Comment: occasionally- weekends   Social History   Tobacco Use  Smoking Status  Former   Types: Cigarettes   Quit date: 10/05/2012   Years since quitting: 10.1  Smokeless Tobacco Never   Denies trauma.   OBJECTIVE:  Vitals:   11/26/22 1216  BP: 121/81  Pulse: 73  Resp: 16  Temp: 98.1 F (36.7 C)  TempSrc: Oral  SpO2: 100%    General appearance: alert; no distress Eyes: PERRLA; EOMI; conjunctiva normal HENT: normocephalic; atraumatic; TMs normal; nasal mucosa normal; oral mucosa normal Neck: supple with FROM Extremities: no cyanosis or edema; symmetrical with no gross deformities Skin: warm and dry Neurologic: normal gait; DTR's normal and symmetric; with normal sensation over bilateral UE; normal grip strength Psychological: alert and cooperative; normal mood and affect  Investigations: Results for orders placed or performed during the hospital encounter of 11/26/22  POC CBG monitoring  Result Value Ref Range   Glucose-Capillary 133 (H) 70 - 99 mg/dL   Labs Reviewed  CBG MONITORING, ED - Abnormal; Notable for the following components:      Result Value   Glucose-Capillary 133 (*)    All other components within normal limits   No results found.  Allergies  Allergen Reactions   Ceftriaxone Itching   Tramadol Nausea And Vomiting and Other (See Comments)    Drowsy     Past Medical History:  Diagnosis Date   Anxiety    Arthritis    Elevated cholesterol    "slightly high" but  no meds recc    Pregnancy induced hypertension    Vaginal Pap smear, abnormal    Vitamin D deficiency    Social History   Socioeconomic History   Marital status: Married    Spouse name: Not on file   Number of children: 2   Years of education: Not on file   Highest education level: GED or equivalent  Occupational History   Not on file  Tobacco Use   Smoking status: Former    Types: Cigarettes    Quit date: 10/05/2012    Years since quitting: 10.1   Smokeless tobacco: Never  Vaping Use   Vaping Use: Never used  Substance and Sexual Activity   Alcohol use:  Yes    Comment: occasionally- weekends   Drug use: No   Sexual activity: Yes    Birth control/protection: Surgical    Comment: tubal ligation  Other Topics Concern   Not on file  Social History Narrative   Not on file   Social Determinants of Health   Financial Resource Strain: Not on file  Food Insecurity: Not on file  Transportation Needs: No Transportation Needs (09/21/2018)   PRAPARE - Hydrologist (Medical): No    Lack of Transportation (Non-Medical): No  Physical Activity: Not on file  Stress: Not on file  Social Connections: Not on file  Intimate Partner Violence: Not on file   Family History  Problem Relation Age of Onset   Healthy Mother    Healthy Father    Past Surgical History:  Procedure Laterality Date   ANTERIOR CRUCIATE LIGAMENT REPAIR     BILATERAL SALPINGECTOMY     BREAST BIOPSY Right 09/28/2018   x2   BREAST BIOPSY Left 11/07/2018   x2   KNEE ARTHROSCOPY WITH ANTERIOR CRUCIATE LIGAMENT (ACL) REPAIR     KNEE ARTHROSCOPY WITH LATERAL MENISECTOMY Left 02/22/2019   Procedure: KNEE ARTHROSCOPY WITH LATERAL MENISECTOMY AND CHONDROPLASTY;  Surgeon: Earlie Server, MD;  Location: [facility];  Service: Orthopedics;  Laterality: Left;   LAPAROSCOPY     " they scraped some cells "    TUBAL LIGATION     VAGINAL HYSTERECTOMY N/A 02/25/2020   Procedure: HYSTERECTOMY VAGINAL;  Surgeon: Chancy Milroy, MD;  Location: [facility];  Service: Gynecology;  Laterality: N/AVanessa Kick, MD 11/30/22 1017    Vanessa Kick, MD 12/01/22 [phone]

## 2023-02-09 ENCOUNTER — Other Ambulatory Visit: Payer: Self-pay

## 2023-02-09 ENCOUNTER — Emergency Department (HOSPITAL_BASED_OUTPATIENT_CLINIC_OR_DEPARTMENT_OTHER)
Admission: EM | Admit: 2023-02-09 | Discharge: 2023-02-09 | Disposition: A | Discharge status: Home / Self Care | Payer: Medicaid Other | Attending: Emergency Medicine | Admitting: Emergency Medicine

## 2023-02-09 ENCOUNTER — Encounter (HOSPITAL_BASED_OUTPATIENT_CLINIC_OR_DEPARTMENT_OTHER): Payer: Self-pay | Admitting: Emergency Medicine

## 2023-02-09 ENCOUNTER — Emergency Department (HOSPITAL_BASED_OUTPATIENT_CLINIC_OR_DEPARTMENT_OTHER): Payer: Medicaid Other

## 2023-02-09 DIAGNOSIS — Z20822 Contact with and (suspected) exposure to covid-19: Secondary | ICD-10-CM | POA: Insufficient documentation

## 2023-02-09 DIAGNOSIS — J069 Acute upper respiratory infection, unspecified: Secondary | ICD-10-CM | POA: Diagnosis not present

## 2023-02-09 DIAGNOSIS — R059 Cough, unspecified: Secondary | ICD-10-CM | POA: Diagnosis present

## 2023-02-09 LAB — SARS CORONAVIRUS 2 BY RT PCR: SARS Coronavirus 2 by RT PCR: NEGATIVE

## 2023-02-09 MED ORDER — BENZONATATE 100 MG PO CAPS: 100.0000 mg | ORAL_CAPSULE | Freq: Three times a day (TID) | ORAL | 0 refills | Status: DC | PRN

## 2023-02-09 NOTE — ED Provider Notes (Signed)
[facility] Provider Note   CSN: [id number] Arrival date & time: 02/09/23  0813     History  Chief Complaint  Patient presents with   Cough    Brittany Smith is a 47 y.o. female.  HPI 47 year old female presents with a chief complaint of cough.  Symptoms started yesterday.  She has not had a fever.  She has had cough which is occasionally productive of clear sputum.  Is also having nasal congestion and allergies and now due to her cough has developed a sore throat.  Feels like sometimes she cannot get a full breath.  Her chest burns after she coughs but otherwise she has not had chest pain.  Home Medications Prior to Admission medications   Medication Sig Start Date End Date Taking? Authorizing Provider  benzonatate (TESSALON) 100 MG capsule Take 1 capsule (100 mg total) by mouth 3 (three) times daily as needed for cough. 02/09/23  Yes Pricilla Loveless, MD  levocetirizine (XYZAL) 5 MG tablet Take 5 mg by mouth at bedtime.    [provider]  predniSONE (STERAPRED UNI-PAK 48 TAB) 10 MG (48) TBPK tablet Take as directed. 11/26/22   Mardella Layman, MD      Allergies    Ceftriaxone and Tramadol    Review of Systems   Review of Systems  Constitutional:  Negative for fever.  HENT:  Positive for congestion and sore throat.   Respiratory:  Positive for cough and shortness of breath.     Physical Exam Updated Vital Signs BP 123/86   Pulse 93   Temp 98.3 F (36.8 C) (Oral)   Resp 16   Ht 5\' 10"  (1.778 m)   Wt 77.1 kg   LMP 01/22/2020 (Exact Date)   SpO2 98%   BMI 24.39 kg/m  Physical Exam Vitals and nursing note reviewed.  Constitutional:      Appearance: She is well-developed.  HENT:     Head: Normocephalic and atraumatic.     Nose: Congestion present.     Mouth/Throat:     Pharynx: No posterior oropharyngeal erythema.     Tonsils: No tonsillar exudate or tonsillar abscesses.  Cardiovascular:     Rate and  Rhythm: Normal rate and regular rhythm.     Heart sounds: Normal heart sounds.  Pulmonary:     Effort: Pulmonary effort is normal.     Breath sounds: Normal breath sounds.  Abdominal:     General: There is no distension.  Skin:    General: Skin is warm and dry.  Neurological:     Mental Status: She is alert.     ED Results / Procedures / Treatments   Labs (all labs ordered are listed, but only abnormal results are displayed) Labs Reviewed  SARS CORONAVIRUS 2 BY RT PCR    EKG None  Radiology DG Chest 2 View  Result Date: 02/09/2023 CLINICAL DATA:  Cough, shortness of breath, chest congestion, and chest burning. EXAM: CHEST - 2 VIEW COMPARISON:  Chest radiographs and chest CTA 07/25/2021 FINDINGS: The cardiomediastinal silhouette is unchanged with normal heart size. No airspace consolidation, edema, pleural effusion, or pneumothorax is identified. Mild thoracic dextroscoliosis is noted. IMPRESSION: No active cardiopulmonary disease. Electronically Signed   By: Sebastian Ache M.D.   On: 02/09/2023 08:59    Procedures Procedures    Medications Ordered in ED Medications - No data to display  ED Course/ Medical Decision Making/ A&P  Medical Decision Making Amount and/or Complexity of Data Reviewed Labs:     Details: COVID test is negative Radiology: ordered and independent interpretation performed.    Details: No pneumonia  Risk Prescription drug management.   Patient presents with an upper respiratory infection.  She does have some chest burning and while I think this is likely from how much she is coughing, I did offer an EKG which she declines.  I suspect this is a viral URI and have recommended supportive care treatments.  I do not think this is likely to be bacterial.  Lungs and x-ray are both clear.  Will give supportive care treatments and have her follow-up with PCP.  Will give return precautions.  She is asking for a work note to go back  to work today.        Final Clinical Impression(s) / ED Diagnoses Final diagnoses:  Upper respiratory tract infection, unspecified type    Rx / DC Orders ED Discharge Orders          Ordered    benzonatate (TESSALON) 100 MG capsule  3 times daily PRN        02/09/23 0944              Pricilla Loveless, MD 02/09/23 1008

## 2023-02-09 NOTE — Discharge Instructions (Addendum)
You are being prescribed a medicine to help with cough.  You should also take Claritin, Flonase, and Mucinex or other over-the-counter cough/mucolytic medicines.  If you develop fever, coughing up blood, shortness of breath, chest pain, or any other new/concerning symptoms then return to the ER or call 911.

## 2023-02-09 NOTE — ED Triage Notes (Signed)
Patient arrives ambulatory by POV c/o having difficulty to catch her breath to talk, cough with clear mucus and chest congestion. Reports coughing spell last night causing her chest to feel on fire. Denies any sick contacts.

## 2023-02-09 NOTE — ED Notes (Signed)
Discharge instructions reviewed with patient. Patient verbalizes understanding, no further questions at this time. Medications/prescriptions and follow up information provided. No acute distress noted at time of departure.  

## 2023-03-17 ENCOUNTER — Emergency Department (HOSPITAL_BASED_OUTPATIENT_CLINIC_OR_DEPARTMENT_OTHER)
Admission: EM | Admit: 2023-03-17 | Discharge: 2023-03-17 | Disposition: A | Discharge status: Home / Self Care | Payer: Medicaid Other | Attending: Emergency Medicine | Admitting: Emergency Medicine

## 2023-03-17 ENCOUNTER — Other Ambulatory Visit (HOSPITAL_BASED_OUTPATIENT_CLINIC_OR_DEPARTMENT_OTHER): Payer: Self-pay

## 2023-03-17 ENCOUNTER — Encounter (HOSPITAL_BASED_OUTPATIENT_CLINIC_OR_DEPARTMENT_OTHER): Payer: Self-pay | Admitting: Urology

## 2023-03-17 ENCOUNTER — Other Ambulatory Visit: Payer: Self-pay

## 2023-03-17 ENCOUNTER — Emergency Department (HOSPITAL_BASED_OUTPATIENT_CLINIC_OR_DEPARTMENT_OTHER): Payer: Medicaid Other

## 2023-03-17 DIAGNOSIS — K529 Noninfective gastroenteritis and colitis, unspecified: Secondary | ICD-10-CM

## 2023-03-17 DIAGNOSIS — R1032 Left lower quadrant pain: Secondary | ICD-10-CM | POA: Insufficient documentation

## 2023-03-17 DIAGNOSIS — R350 Frequency of micturition: Secondary | ICD-10-CM | POA: Insufficient documentation

## 2023-03-17 DIAGNOSIS — N83202 Unspecified ovarian cyst, left side: Secondary | ICD-10-CM

## 2023-03-17 LAB — URINALYSIS, ROUTINE W REFLEX MICROSCOPIC
Bilirubin Urine: NEGATIVE
Glucose, UA: NEGATIVE mg/dL
Hgb urine dipstick: NEGATIVE
Ketones, ur: NEGATIVE mg/dL
Leukocytes,Ua: NEGATIVE
Nitrite: NEGATIVE
Protein, ur: NEGATIVE mg/dL
Specific Gravity, Urine: >=1.03 (ref 1.005–1.030)
pH: 5.5 (ref 5.0–8.0)

## 2023-03-17 LAB — CBC WITH DIFFERENTIAL/PLATELET
Abs Immature Granulocytes: 0.04 10*3/uL (ref 0.00–0.07)
Basophils Absolute: 0.1 10*3/uL (ref 0.0–0.1)
Basophils Relative: 1 %
Eosinophils Absolute: 0.2 10*3/uL (ref 0.0–0.5)
Eosinophils Relative: 2 %
HCT: 38.3 % (ref 36.0–46.0)
Hemoglobin: 12.8 g/dL (ref 12.0–15.0)
Immature Granulocytes: 0 %
Lymphocytes Relative: 24 %
Lymphs Abs: 2.5 10*3/uL (ref 0.7–4.0)
MCH: 28.1 pg (ref 26.0–34.0)
MCHC: 33.4 g/dL (ref 30.0–36.0)
MCV: 84.2 fL (ref 80.0–100.0)
Monocytes Absolute: 0.9 10*3/uL (ref 0.1–1.0)
Monocytes Relative: 9 %
Neutro Abs: 6.5 10*3/uL (ref 1.7–7.7)
Neutrophils Relative %: 64 %
Platelets: 292 10*3/uL (ref 150–400)
RBC: 4.55 MIL/uL (ref 3.87–5.11)
RDW: 14.4 % (ref 11.5–15.5)
WBC: 10.2 10*3/uL (ref 4.0–10.5)
nRBC: 0 % (ref 0.0–0.2)

## 2023-03-17 LAB — COMPREHENSIVE METABOLIC PANEL
ALT: 9 U/L (ref 0–44)
AST: 13 U/L — ABNORMAL LOW (ref 15–41)
Albumin: 3.9 g/dL (ref 3.5–5.0)
Alkaline Phosphatase: 49 U/L (ref 38–126)
Anion gap: 6 (ref 5–15)
BUN: 17 mg/dL (ref 6–20)
CO2: 24 mmol/L (ref 22–32)
Calcium: 8.9 mg/dL (ref 8.9–10.3)
Chloride: 106 mmol/L (ref 98–111)
Creatinine, Ser: 0.81 mg/dL (ref 0.44–1.00)
GFR, Estimated: >60 mL/min (ref >60)
Glucose, Bld: 94 mg/dL (ref 70–99)
Potassium: 4.1 mmol/L (ref 3.5–5.1)
Sodium: 136 mmol/L (ref 135–145)
Total Bilirubin: 0.4 mg/dL (ref 0.3–1.2)
Total Protein: 6.6 g/dL (ref 6.5–8.1)

## 2023-03-17 LAB — LACTIC ACID, PLASMA: Lactic Acid, Venous: 0.8 mmol/L (ref 0.5–1.9)

## 2023-03-17 LAB — PREGNANCY, URINE: Preg Test, Ur: NEGATIVE

## 2023-03-17 LAB — LIPASE, BLOOD: Lipase: 34 U/L (ref 11–51)

## 2023-03-17 MED ORDER — IOHEXOL 300 MG/ML  SOLN
100.0000 mL | Freq: Once | INTRAMUSCULAR | Status: AC | PRN
  Administered 2023-03-17: 100 mL via INTRAVENOUS

## 2023-03-17 MED ORDER — AMOXICILLIN-POT CLAVULANATE 875-125 MG PO TABS
1.0000 | ORAL_TABLET | Freq: Two times a day (BID) | ORAL | 0 refills | Status: DC
  Filled 2023-03-17: qty 14, 7d supply, fill #0

## 2023-03-17 NOTE — ED Provider Notes (Signed)
[facility] Provider Note   CSN: [id number] Arrival date & time: 03/17/23  1256     History  Chief Complaint  Patient presents with   Abdominal Pain    Brittany Smith is a 47 y.o. female with IBS who presents with intermittent LLQ pain that started 3 days ago with several episodes of loose stools, 4 episodes yesterday.  No history of similar pain.  Denies any N/V, reports urinary frequency. Denies fever. Pain worse with movement.  She has no urinary, vaginal symptoms.  No hematochezia/melena.  Has a prior abdominal surgical history of hysterectomy.  Had a colonoscopy 2 years ago which showed some benign polyps.   Abdominal Pain      Home Medications Prior to Admission medications   Medication Sig Start Date End Date Taking? Authorizing Provider  benzonatate (TESSALON) 100 MG capsule Take 1 capsule (100 mg total) by mouth 3 (three) times daily as needed for cough. 02/09/23   Pricilla Loveless, MD  levocetirizine (XYZAL) 5 MG tablet Take 5 mg by mouth at bedtime.    [provider]  predniSONE (STERAPRED UNI-PAK 48 TAB) 10 MG (48) TBPK tablet Take as directed. 11/26/22   Mardella Layman, MD      Allergies    Ceftriaxone and Tramadol    Review of Systems   Review of Systems  Gastrointestinal:  Positive for abdominal pain.   A 10 point review of systems was performed and is negative unless otherwise reported in HPI.  Physical Exam Updated Vital Signs BP (!) 114/54 (BP Location: Right Arm)   Pulse 75   Temp (!) 97.5 F (36.4 C) (Oral)   Resp 18   Ht 5\' 10"  (1.778 m)   Wt 77.1 kg   LMP 01/22/2020 (Exact Date)   SpO2 99%   BMI 24.39 kg/m  Physical Exam General: Normal appearing female, lying in bed.  HEENT: Sclera anicteric, MMM, trachea midline.  Cardiology: RRR, no murmurs/rubs/gallops. BL radial and DP pulses equal bilaterally.  Resp: Normal respiratory rate and effort. CTAB, no wheezes, rhonchi, crackles.   Abd: Soft, +LLQ TTP, non-distended. No rebound tenderness or guarding.  GU: Deferred. MSK: No peripheral edema or signs of trauma. Extremities without deformity or TTP. No cyanosis or clubbing. Skin: warm, dry. Back: No CVA tenderness Neuro: A&Ox4, CNs II-XII grossly intact. MAEs. Sensation grossly intact.  Psych: Normal mood and affect.   ED Results / Procedures / Treatments   Labs (all labs ordered are listed, but only abnormal results are displayed) Labs Reviewed  COMPREHENSIVE METABOLIC PANEL - Abnormal; Notable for the following components:      Result Value   AST 13 (*)    All other components within normal limits  URINALYSIS, ROUTINE W REFLEX MICROSCOPIC  CBC WITH DIFFERENTIAL/PLATELET  LIPASE, BLOOD  LACTIC ACID, PLASMA  PREGNANCY, URINE  LACTIC ACID, PLASMA    EKG None  Radiology No results found.  Procedures Procedures    Medications Ordered in ED Medications  iohexol (OMNIPAQUE) 300 MG/ML solution 100 mL (100 mLs Intravenous Contrast Given 03/17/23 1456)    ED Course/ Medical Decision Making/ A&P                          Medical Decision Making Amount and/or Complexity of Data Reviewed Labs: ordered. Decision-making details documented in ED Course. Radiology: ordered.  Risk Prescription drug management.    This patient presents to the ED for concern of  LLQ abd pain, this involves an extensive number of treatment options, and is a complaint that carries with it a high risk of complications and morbidity.  HDS, afebrile, well-appearing  MDM:    For DDX for abdominal pain includes but is not limited to:  Abdominal exam without peritoneal signs. No evidence of acute abdomen at this time.  Greatest concern for diverticulitis with intermittent left lower quad abdominal pain and left lower quadrant tenderness palpation.  Will obtain CT Abd pelvis for further evaluation.  Also consider patient with known IBS symptoms.  Low suspicion for acute  hepatobiliary disease (including acute cholecystitis or cholangitis), acute pancreatitis (neg lipase), PUD (including gastric perforation), vascular catastrophe, bowel obstruction, viscus perforation.     Clinical Course as of 03/17/23 1504  Fri Mar 17, 2023  1328 Urinalysis, Routine w reflex microscopic -Urine, Clean Catch No UTI or hematuria [HN]  1504 Labs including CMP, CBC, lipase, urine preg, and lactate are wnl.  [HN]    Clinical Course User Index [HN] Loetta Rough, MD    Labs: I Ordered, and personally interpreted labs.  The pertinent results include:  those listed above  Imaging Studies ordered: I ordered imaging studies including CT abd pelvis w contrast I independently visualized and interpreted imaging. I agree with the radiologist interpretation  Additional history obtained from chart review.    Social Determinants of Health: Patient lives independently   Disposition:  Patient is signed out to the oncoming ED physician Dr. Lockie Mola who is made aware of her history, presentation, exam, workup, and plan. Plan is to obtain CT abd pelvis and reeval.   Co morbidities that complicate the patient evaluation  Past Medical History:  Diagnosis Date   Anxiety    Arthritis    Elevated cholesterol    "slightly high" but no meds recc    Pregnancy induced hypertension    Vaginal Pap smear, abnormal    Vitamin D deficiency      Medicines Meds ordered this encounter  Medications   iohexol (OMNIPAQUE) 300 MG/ML solution 100 mL    I have reviewed the patients home medicines and have made adjustments as needed  Problem List / ED Course: Problem List Items Addressed This Visit   None               This note was created using dictation software, which may contain spelling or grammatical errors.    Loetta Rough, MD 03/17/23 1504

## 2023-03-17 NOTE — ED Triage Notes (Signed)
Pt states LLQ pain that started 3 days ago with diarrhea  Denies any N/V, reports urinary frequency  Denies fever Pain worse with sitting down   H/o IBS

## 2023-03-17 NOTE — Discharge Instructions (Signed)
Recommend 1000 mg of Tylenol every 6 hours as needed for pain.  Recommend 600 mg ibuprofen every 8 hours as needed for pain.  Take antibiotics as prescribed.  Follow-up with your primary care doctor.

## 2023-03-17 NOTE — ED Provider Notes (Signed)
CT scan showed left-sided ovarian cyst that may be has a rupture but also colitis.  Will prescribe Augmentin.  Pain is pretty well-controlled.  She will continue Tylenol and ibuprofen at home.  Lab work was unremarkable.  Patient discharged in good condition.  Recommend follow-up with primary care doctor.  This chart was dictated using voice recognition software.  Despite best efforts to proofread,  errors can occur which can change the documentation meaning.    Virgina Norfolk, DO 03/17/23 1545

## 2023-06-15 ENCOUNTER — Ambulatory Visit
Admission: RE | Admit: 2023-06-15 | Discharge: 2023-06-15 | Disposition: A | Discharge status: Home / Self Care | Payer: Medicaid Other | Source: Ambulatory Visit | Attending: Internal Medicine | Admitting: Internal Medicine

## 2023-06-15 VITALS — BP 117/60 | HR 61 | Temp 98.3°F | Resp 16

## 2023-06-15 DIAGNOSIS — M25531 Pain in right wrist: Secondary | ICD-10-CM | POA: Diagnosis not present

## 2023-06-15 DIAGNOSIS — G5601 Carpal tunnel syndrome, right upper limb: Secondary | ICD-10-CM

## 2023-06-15 MED ORDER — METHYLPREDNISOLONE 4 MG PO TBPK: ORAL_TABLET | ORAL | 0 refills | Status: DC

## 2023-06-15 MED ORDER — METHYLPREDNISOLONE ACETATE 80 MG/ML IJ SUSP
40.0000 mg | Freq: Once | INTRAMUSCULAR | Status: AC
  Administered 2023-06-15: 40 mg via INTRAMUSCULAR

## 2023-06-15 NOTE — Discharge Instructions (Signed)
You are given an injection of a steroid in the clinic.  Start the steroid taper pack tomorrow, 9/13.  Continue wearing the wrist brace.  Contact orthopedics to make an appointment to be evaluated for further treatment options of your symptoms.  Please go to the ER if you develop any worsening symptoms.  I hope you feel better soon!

## 2023-06-15 NOTE — ED Triage Notes (Signed)
Pt presents to UC w/ c/o bilateral wrist pain, worse on the right x1 week. Pt states she was unable to sleep last night d/t pain. Pt has tried using wrist brace and using naproxen without relief.  Denies direct injury.

## 2023-06-15 NOTE — ED Provider Notes (Signed)
[facility]    CSN: [id number] Arrival date & time: 06/15/23  1726      History   Chief Complaint No chief complaint on file.   HPI Brittany Smith is a 47 y.o. female presents for evaluation of wrist pain.  Patient reports several months of bilateral wrist pain with the right being the worst.  States pain keeps her up at night.  She does endorse paresthesias that extend from her elbow to her thumb second and third fingers.  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  She was seen for this in February in urgent care and states she had improvement after given steroids.  She has been wearing a wrist brace at night to help with pain.  She has not followed up with the PCP or orthopedics regarding her symptoms.  No history of diabetes.  No other concerns at this time.  HPI  Past Medical History:  Diagnosis Date   Anxiety    Arthritis    Elevated cholesterol    "slightly high" but no meds recc    Pregnancy induced hypertension    Vaginal Pap smear, abnormal    Vitamin D deficiency     Patient Active Problem List   Diagnosis Date Noted   Post-operative state 02/25/2020   Papilloma of both breasts 07/09/2019    Past Surgical History:  Procedure Laterality Date   ANTERIOR CRUCIATE LIGAMENT REPAIR     BILATERAL SALPINGECTOMY     BREAST BIOPSY Right 09/28/2018   x2   BREAST BIOPSY Left 11/07/2018   x2   KNEE ARTHROSCOPY WITH ANTERIOR CRUCIATE LIGAMENT (ACL) REPAIR     KNEE ARTHROSCOPY WITH LATERAL MENISECTOMY Left 02/22/2019   Procedure: KNEE ARTHROSCOPY WITH LATERAL MENISECTOMY AND CHONDROPLASTY;  Surgeon: Frederico Hamman, MD;  Location: [facility];  Service: Orthopedics;  Laterality: Left;   LAPAROSCOPY     " they scraped some cells "    TUBAL LIGATION     VAGINAL HYSTERECTOMY N/A 02/25/2020   Procedure: HYSTERECTOMY VAGINAL;  Surgeon: Hermina Staggers, MD;  Location: [facility];  Service: Gynecology;  Laterality:  N/A;    OB History     Gravida  3   Para  1   Term      Preterm  1   AB  1   Living  2      SAB      IAB      Ectopic  1   Multiple      Live Births  2            Home Medications    Prior to Admission medications   Medication Sig Start Date End Date Taking? Authorizing Provider  methylPREDNISolone (MEDROL DOSEPAK) 4 MG TBPK tablet Take as directed on package 06/16/23  Yes Radford Pax, NP  amoxicillin-clavulanate (AUGMENTIN) 875-125 MG tablet Take 1 tablet by mouth every 12 (twelve) hours. 03/17/23   Curatolo, Adam, DO  benzonatate (TESSALON) 100 MG capsule Take 1 capsule (100 mg total) by mouth 3 (three) times daily as needed for cough. 02/09/23   Pricilla Loveless, MD  levocetirizine (XYZAL) 5 MG tablet Take 5 mg by mouth at bedtime.    [provider]    Family History Family History  Problem Relation Age of Onset   Healthy Mother    Healthy Father     Social History Social History   Tobacco Use   Smoking status: Former  Current packs/day: 0.00    Types: Cigarettes    Quit date: 10/05/2012    Years since quitting: 10.6   Smokeless tobacco: Never  Vaping Use   Vaping status: Never Used  Substance Use Topics   Alcohol use: Yes    Comment: occasionally- weekends   Drug use: No     Allergies   Ceftriaxone and Tramadol   Review of Systems Review of Systems  Musculoskeletal:        Bilateral wrist pain right greater than left     Physical Exam Triage Vital Signs ED Triage Vitals [06/15/23 1741]  Encounter Vitals Group     BP 117/60     Systolic BP Percentile      Diastolic BP Percentile      Pulse Rate 61     Resp 16     Temp 98.3 F (36.8 C)     Temp Source Oral     SpO2 96 %     Weight      Height      Head Circumference      Peak Flow      Pain Score      Pain Loc      Pain Education      Exclude from Growth Chart    No data found.  Updated Vital Signs BP 117/60 (BP Location: Right Arm)   Pulse 61   Temp  98.3 F (36.8 C) (Oral)   Resp 16   LMP 01/22/2020 (Exact Date)   SpO2 96%   Visual Acuity Right Eye Distance:   Left Eye Distance:   Bilateral Distance:    Right Eye Near:   Left Eye Near:    Bilateral Near:     Physical Exam Vitals and nursing note reviewed.  Constitutional:      General: She is not in acute distress.    Appearance: Normal appearance. She is not ill-appearing.  HENT:     Head: Normocephalic and atraumatic.  Eyes:     Pupils: Pupils are equal, round, and reactive to light.  Cardiovascular:     Rate and Rhythm: Normal rate.  Musculoskeletal:     Right wrist: No swelling, deformity, effusion, lacerations, tenderness, bony tenderness, snuff box tenderness or crepitus. Normal range of motion. Normal pulse.     Comments: Positive Fallon test   Skin:    General: Skin is warm and dry.  Neurological:     General: No focal deficit present.     Mental Status: She is alert and oriented to person, place, and time.  Psychiatric:        Mood and Affect: Mood normal.        Behavior: Behavior normal.      UC Treatments / Results  Labs (all labs ordered are listed, but only abnormal results are displayed) Labs Reviewed - No data to display  EKG   Radiology No results found.  Procedures Procedures (including critical care time)  Medications Ordered in UC Medications  methylPREDNISolone acetate (DEPO-MEDROL) injection 40 mg (has no administration in time range)    Initial Impression / Assessment and Plan / UC Course  I have reviewed the triage vital signs and the nursing notes.  Pertinent labs & imaging results that were available during my care of the patient were reviewed by me and considered in my medical decision making (see chart for details).     Reviewed symptoms and exam with patient.  Discussed symptoms consistent with likely carpal tunnel.  IM Depo-Medrol given in clinic and patient was monitored for 10 minutes after injection with no  reaction noted and tolerated well.  Will start Medrol Dosepak tomorrow, 9/13.  Advised to continue wearing the wrist brace and contact information for orthopedics has been provided so patient can make an appointment for follow-up.  ER precautions reviewed and patient verbalized understanding. Final Clinical Impressions(s) / UC Diagnoses   Final diagnoses:  Right wrist pain  Carpal tunnel syndrome of right wrist     Discharge Instructions      You are given an injection of a steroid in the clinic.  Start the steroid taper pack tomorrow, 9/13.  Continue wearing the wrist brace.  Contact orthopedics to make an appointment to be evaluated for further treatment options of your symptoms.  Please go to the ER if you develop any worsening symptoms.  I hope you feel better soon!    ED Prescriptions     Medication Sig Dispense Auth. Provider   methylPREDNISolone (MEDROL DOSEPAK) 4 MG TBPK tablet Take as directed on package 21 tablet Radford Pax, NP      PDMP not reviewed this encounter.   Radford Pax, NP 06/15/23 [phone]

## 2023-08-01 ENCOUNTER — Other Ambulatory Visit (HOSPITAL_COMMUNITY): Payer: Self-pay

## 2023-08-14 NOTE — Telephone Encounter (Signed)
Error

## 2023-08-21 ENCOUNTER — Other Ambulatory Visit: Payer: Self-pay

## 2023-08-21 ENCOUNTER — Emergency Department (HOSPITAL_BASED_OUTPATIENT_CLINIC_OR_DEPARTMENT_OTHER)
Admission: EM | Admit: 2023-08-21 | Discharge: 2023-08-21 | Disposition: A | Discharge status: Home / Self Care | Payer: Medicaid Other | Attending: Emergency Medicine | Admitting: Emergency Medicine

## 2023-08-21 ENCOUNTER — Encounter (HOSPITAL_BASED_OUTPATIENT_CLINIC_OR_DEPARTMENT_OTHER): Payer: Self-pay | Admitting: Urology

## 2023-08-21 DIAGNOSIS — M25532 Pain in left wrist: Secondary | ICD-10-CM | POA: Insufficient documentation

## 2023-08-21 DIAGNOSIS — M25531 Pain in right wrist: Secondary | ICD-10-CM | POA: Insufficient documentation

## 2023-08-21 MED ORDER — HYDROCODONE-ACETAMINOPHEN 5-325 MG PO TABS: 1.0000 | ORAL_TABLET | Freq: Four times a day (QID) | ORAL | 0 refills | Status: DC | PRN

## 2023-08-21 MED ORDER — DICLOFENAC SODIUM 1 % EX GEL: 2.0000 g | Freq: Four times a day (QID) | CUTANEOUS | 0 refills | Status: DC

## 2023-08-21 NOTE — Discharge Instructions (Addendum)
Please read the attached guide concerning carpal tunnel syndrome.  Please begin utilizing Voltaren gel sent to your pharmacy.  You may apply it 4 times a day.  Please do not take other NSAID medication such as ibuprofen, naproxen while taking Voltaren.  You may also take short course of pain medication at night to help with sleep.  I have sent 5 hydrocodone's to your pharmacy.  Please follow-up with Dr. Carola Frost of orthopedic surgery.  Please see work note.

## 2023-08-21 NOTE — ED Provider Notes (Signed)
[place] [facility] Provider Note   CSN: [id number] Arrival date & time: 08/21/23  1304     History  Chief Complaint  Patient presents with   Wrist Pain    Brittany Smith is a 47 y.o. female with medical history of anxiety, arthritis, chronic carpal tunnel of her right wrist.  She reports to the ED for evaluation of chronic wrist pain resulting from her carpal tunnel syndrome.  She reports that she has had carpal tunnel syndrome for "years.  She arrives with a wrist brace on her right wrist.  She states that she has been unable to get a full night sleep due to the pain in her wrist that has been present for quite some time.  She reports that she does not have a PCP currently and is in the process of becoming established with one.  She states that she has been advised in the past that she will most likely need surgical management of her carpal tunnel syndrome but she states that she has been trying all conservative measures to try and avoid this.  She states that her main issue today is not being able to sleep at all.  She reports that she cannot get a full night sleep because of the pain in her wrist.  She states that heating pads will sometimes help but the pain never fully goes away.  States that she has seen numerous urgent cares for the same issue and was provided with prednisone shots, Toradol and none of it is helping.  She denies any recent trauma to her wrists.  Reports that all of her pain is just her chronic pain from her carpal tunnel syndrome.  Denies fevers, nausea, vomiting, swelling to her wrist.   Wrist Pain       Home Medications Prior to Admission medications   Medication Sig Start Date End Date Taking? Authorizing Provider  diclofenac Sodium (VOLTAREN) 1 % GEL Apply 2 g topically 4 (four) times daily. 08/21/23  Yes Al Decant, PA-C  HYDROcodone-acetaminophen (NORCO/VICODIN) 5-325 MG tablet Take 1 tablet by mouth every 6  (six) hours as needed. 08/21/23  Yes Al Decant, PA-C  amoxicillin-clavulanate (AUGMENTIN) 875-125 MG tablet Take 1 tablet by mouth every 12 (twelve) hours. 03/17/23   Curatolo, Adam, DO  benzonatate (TESSALON) 100 MG capsule Take 1 capsule (100 mg total) by mouth 3 (three) times daily as needed for cough. 02/09/23   Pricilla Loveless, MD  levocetirizine (XYZAL) 5 MG tablet Take 5 mg by mouth at bedtime.    [provider]  methylPREDNISolone (MEDROL DOSEPAK) 4 MG TBPK tablet Take as directed on package 06/16/23   Radford Pax, NP      Allergies    Ceftriaxone and Tramadol    Review of Systems   Review of Systems  Musculoskeletal:  Positive for arthralgias.  All other systems reviewed and are negative.   Physical Exam Updated Vital Signs BP 111/60 (BP Location: Left Arm)   Pulse 91   Temp 98.6 F (37 C)   Resp 18   Ht 5\' 10"  (1.778 m)   Wt 77.1 kg   LMP 01/22/2020 (Exact Date)   SpO2 97%   BMI 24.39 kg/m  Physical Exam Vitals and nursing note reviewed.  Constitutional:      General: She is not in acute distress.    Appearance: Normal appearance. She is not ill-appearing, toxic-appearing or diaphoretic.  HENT:     Head: Normocephalic and  atraumatic.     Nose: Nose normal.     Mouth/Throat:     Mouth: Mucous membranes are moist.     Pharynx: Oropharynx is clear.  Eyes:     Extraocular Movements: Extraocular movements intact.     Conjunctiva/sclera: Conjunctivae normal.     Pupils: Pupils are equal, round, and reactive to light.  Cardiovascular:     Rate and Rhythm: Normal rate and regular rhythm.  Pulmonary:     Effort: Pulmonary effort is normal.     Breath sounds: Normal breath sounds. No wheezing.  Abdominal:     General: Abdomen is flat. Bowel sounds are normal.     Palpations: Abdomen is soft.     Tenderness: There is no abdominal tenderness.  Musculoskeletal:     Right wrist: Tenderness present. No swelling. Normal range of motion.     Left  wrist: Tenderness present. No swelling. Normal range of motion.     Comments: Full range of motion of bilateral wrists appreciated.  She does have nonfocal tenderness.  Skin:    Capillary Refill: Capillary refill takes less than 2 seconds.  Neurological:     Mental Status: She is alert and oriented to person, place, and time.     ED Results / Procedures / Treatments   Labs (all labs ordered are listed, but only abnormal results are displayed) Labs Reviewed - No data to display  EKG None  Radiology No results found.  Procedures Procedures   Medications Ordered in ED Medications - No data to display  ED Course/ Medical Decision Making/ A&P  Medical Decision Making  47 year old female presents to ED for evaluation.  Please see HPI for further details.  On exam patient has full range of motion of her bilateral wrists.  She has nonfocal tenderness.  There is no swelling, erythema to her wrists.  She reports that all of her pain is from her chronic carpal tunnel syndrome which she states she has been told in the past she will need surgical management of.  She states also her issue today is not being able to sleep.  She reports that she has tried melatonin, Benadryl without relief.  This seems to be a chronic issue.  Have advised the patient that she will most likely need to follow-up with orthopedic surgery.  I will refer her.  I will also prescribe voltaren gel and a short course of pain medication for breakthrough pain that she can take at night to help sleep.  She is in agreement with this plan.  Stable to discharge.   Final Clinical Impression(s) / ED Diagnoses Final diagnoses:  Pain in both wrists    Rx / DC Orders ED Discharge Orders          Ordered    diclofenac Sodium (VOLTAREN) 1 % GEL  4 times daily        08/21/23 1359    HYDROcodone-acetaminophen (NORCO/VICODIN) 5-325 MG tablet  Every 6 hours PRN        08/21/23 1359              Al Decant, PA-C 08/21/23 1401    Benjiman Core, MD 08/21/23 1451

## 2023-08-21 NOTE — ED Triage Notes (Addendum)
Pt states Carpel Tunnel pain to bilateral wrists that is chronic and getting worse  States pain worse last night, does not have PCP at this time

## 2023-12-20 ENCOUNTER — Encounter (HOSPITAL_COMMUNITY): Payer: Self-pay | Admitting: Emergency Medicine

## 2023-12-20 ENCOUNTER — Other Ambulatory Visit: Payer: Self-pay

## 2023-12-20 ENCOUNTER — Emergency Department (HOSPITAL_COMMUNITY)
Admission: EM | Admit: 2023-12-20 | Discharge: 2023-12-20 | Disposition: A | Discharge status: Home / Self Care | Payer: Self-pay | Attending: Emergency Medicine | Admitting: Emergency Medicine

## 2023-12-20 DIAGNOSIS — M25531 Pain in right wrist: Secondary | ICD-10-CM | POA: Diagnosis present

## 2023-12-20 MED ORDER — METHYLPREDNISOLONE 4 MG PO TBPK: ORAL_TABLET | ORAL | 0 refills | Status: DC

## 2023-12-20 MED ORDER — OXYCODONE HCL 5 MG PO TABS: 5.0000 mg | ORAL_TABLET | Freq: Three times a day (TID) | ORAL | 0 refills | Status: DC | PRN

## 2023-12-20 MED ORDER — KETOROLAC TROMETHAMINE 60 MG/2ML IM SOLN
60.0000 mg | Freq: Once | INTRAMUSCULAR | Status: AC
  Administered 2023-12-20: 60 mg via INTRAMUSCULAR
  Filled 2023-12-20: qty 2

## 2023-12-20 NOTE — ED Triage Notes (Signed)
 Pt c/o hand and wrist pain x 2 days. Pt has been diagnosed with carpal tunnel.

## 2023-12-20 NOTE — Discharge Instructions (Signed)
 Recommend 1000 mL of Tylenol every 6 hours as needed for pain.  Take Medrol Dosepak as prescribed.  Take Roxicodone for breakthrough pain.  This is a narcotic pain medicine please be careful with its use.  Do not mix with alcohol drugs or dangerous activities including driving.  Follow-up with neurology.  Continue to use your wrist splint.

## 2023-12-20 NOTE — ED Provider Notes (Signed)
 [place] [facility] Provider Note   CSN: [id number] Arrival date & time: 12/20/23  0430     History  Chief Complaint  Patient presents with   Hand Pain    Brittany Smith is a 48 y.o. female.  Here with right wrist pain.  Similar to her pain in the past.  History of carpal tunnel.  Her wrist brace not helping as much.  She works with her hands doing packaging.  Things have flared up the last couple days.  Denies any specific trauma or falls otherwise.  Denies any fever or chills.  She will have tingling in her first 3 digits of her right hand intermittently.    The history is provided by the patient.       Home Medications Prior to Admission medications   Medication Sig Start Date End Date Taking? Authorizing Provider  methylPREDNISolone (MEDROL DOSEPAK) 4 MG TBPK tablet Follow package insert 12/20/23  Yes Jeffrey Graefe, DO  oxyCODONE (ROXICODONE) 5 MG immediate release tablet Take 1 tablet (5 mg total) by mouth every 8 (eight) hours as needed for up to 10 doses for breakthrough pain. 12/20/23  Yes Holdyn Poyser, DO  amoxicillin-clavulanate (AUGMENTIN) 875-125 MG tablet Take 1 tablet by mouth every 12 (twelve) hours. 03/17/23   Johnnette Laux, DO  benzonatate (TESSALON) 100 MG capsule Take 1 capsule (100 mg total) by mouth 3 (three) times daily as needed for cough. 02/09/23   Pricilla Loveless, MD  diclofenac Sodium (VOLTAREN) 1 % GEL Apply 2 g topically 4 (four) times daily. 08/21/23   Al Decant, PA-C  HYDROcodone-acetaminophen (NORCO/VICODIN) 5-325 MG tablet Take 1 tablet by mouth every 6 (six) hours as needed. 08/21/23   Al Decant, PA-C  levocetirizine (XYZAL) 5 MG tablet Take 5 mg by mouth at bedtime.    [provider]      Allergies    Ceftriaxone and Tramadol    Review of Systems   Review of Systems  Physical Exam Updated Vital Signs BP 113/69 (BP Location: Right Arm)   Pulse 64   Temp 98.2 F (36.8  C)   Resp 18   Ht 5\' 10"  (1.778 m)   Wt 77.1 kg   LMP 01/22/2020 (Exact Date)   SpO2 98%   BMI 24.39 kg/m  Physical Exam Vitals and nursing note reviewed.  Constitutional:      General: She is not in acute distress.    Appearance: She is well-developed. She is not ill-appearing.  HENT:     Head: Normocephalic and atraumatic.     Nose: Nose normal.     Mouth/Throat:     Mouth: Mucous membranes are moist.  Eyes:     Extraocular Movements: Extraocular movements intact.     Conjunctiva/sclera: Conjunctivae normal.     Pupils: Pupils are equal, round, and reactive to light.  Cardiovascular:     Rate and Rhythm: Normal rate and regular rhythm.     Pulses: Normal pulses.     Heart sounds: Normal heart sounds. No murmur heard. Pulmonary:     Effort: Pulmonary effort is normal. No respiratory distress.     Breath sounds: Normal breath sounds.  Abdominal:     Palpations: Abdomen is soft.     Tenderness: There is no abdominal tenderness.  Musculoskeletal:        General: Tenderness present. No swelling. Normal range of motion.     Cervical back: Normal range of motion and neck supple.  Comments: Tenderness when performing Tinel test on the right wrist but otherwise normal range of motion without much discomfort  Skin:    General: Skin is warm and dry.     Capillary Refill: Capillary refill takes less than 2 seconds.     Findings: No erythema.  Neurological:     General: No focal deficit present.     Mental Status: She is alert and oriented to person, place, and time.     Sensory: No sensory deficit.     Motor: No weakness.  Psychiatric:        Mood and Affect: Mood normal.     ED Results / Procedures / Treatments   Labs (all labs ordered are listed, but only abnormal results are displayed) Labs Reviewed - No data to display  EKG None  Radiology No results found.  Procedures Procedures    Medications Ordered in ED Medications  ketorolac (TORADOL) injection  60 mg (has no administration in time range)    ED Course/ Medical Decision Making/ A&P                                 Medical Decision Making Risk Prescription drug management.   [name] is here with right wrist pain.  History of carpal tunnel.  Normal vitals.  No fever.  Tinel's sign positive in the right carpal tunnel area.  She had normal strength and sensation.  No signs of infectious process.  She got good range of motion.  Have no concern for septic joint fracture or other acute process.  I do think that this is an acute on chronic flare of her carpal tunnel likely from overuse given her job.  Will prescribe her Medrol Dosepak, Roxicodone for breakthrough pain recommend that she continue to wear her wrist splints.  She is never really seen a neurologist for this or has not recently and will refer her back to see if they can do nerve conduction studies.  She is neurovascular neuromuscular intact otherwise.  Discharged in good condition.  Understands return precautions.  This chart was dictated using voice recognition software.  Despite best efforts to proofread,  errors can occur which can change the documentation meaning.         Final Clinical Impression(s) / ED Diagnoses Final diagnoses:  Right wrist pain    Rx / DC Orders ED Discharge Orders          Ordered    methylPREDNISolone (MEDROL DOSEPAK) 4 MG TBPK tablet        12/20/23 0926    oxyCODONE (ROXICODONE) 5 MG immediate release tablet  Every 8 hours PRN        12/20/23 0926    Ambulatory referral to Neurology       Comments: An appointment is requested in approximately: 2 weeks   12/20/23 0927              Virgina Norfolk, DO 12/20/23 0930

## 2023-12-20 NOTE — ED Notes (Signed)
 Patient verbalized understanding of discharge instructions, patient given opportunity to ask questions, patient does not have any concerns at this time. Patient ambulated to the ED lobby.

## 2024-02-19 ENCOUNTER — Other Ambulatory Visit: Payer: Self-pay

## 2024-02-19 ENCOUNTER — Emergency Department (HOSPITAL_COMMUNITY): Payer: Self-pay

## 2024-02-19 ENCOUNTER — Encounter (HOSPITAL_COMMUNITY): Payer: Self-pay | Admitting: *Deleted

## 2024-02-19 ENCOUNTER — Emergency Department (HOSPITAL_COMMUNITY)
Admission: EM | Admit: 2024-02-19 | Discharge: 2024-02-19 | Disposition: A | Discharge status: Home / Self Care | Payer: Self-pay | Attending: Emergency Medicine | Admitting: Emergency Medicine

## 2024-02-19 DIAGNOSIS — G43909 Migraine, unspecified, not intractable, without status migrainosus: Secondary | ICD-10-CM | POA: Insufficient documentation

## 2024-02-19 DIAGNOSIS — R519 Headache, unspecified: Secondary | ICD-10-CM | POA: Diagnosis present

## 2024-02-19 DIAGNOSIS — F17200 Nicotine dependence, unspecified, uncomplicated: Secondary | ICD-10-CM | POA: Insufficient documentation

## 2024-02-19 LAB — CBC WITH DIFFERENTIAL/PLATELET
Abs Immature Granulocytes: 0.03 10*3/uL (ref 0.00–0.07)
Basophils Absolute: 0.1 10*3/uL (ref 0.0–0.1)
Basophils Relative: 1 %
Eosinophils Absolute: 0.3 10*3/uL (ref 0.0–0.5)
Eosinophils Relative: 3 %
HCT: 41.8 % (ref 36.0–46.0)
Hemoglobin: 14.2 g/dL (ref 12.0–15.0)
Immature Granulocytes: 0 %
Lymphocytes Relative: 20 %
Lymphs Abs: 2.1 10*3/uL (ref 0.7–4.0)
MCH: 28.9 pg (ref 26.0–34.0)
MCHC: 34 g/dL (ref 30.0–36.0)
MCV: 85.1 fL (ref 80.0–100.0)
Monocytes Absolute: 0.8 10*3/uL (ref 0.1–1.0)
Monocytes Relative: 7 %
Neutro Abs: 7.4 10*3/uL (ref 1.7–7.7)
Neutrophils Relative %: 69 %
Platelets: 297 10*3/uL (ref 150–400)
RBC: 4.91 MIL/uL (ref 3.87–5.11)
RDW: 14.9 % (ref 11.5–15.5)
WBC: 10.7 10*3/uL — ABNORMAL HIGH (ref 4.0–10.5)
nRBC: 0 % (ref 0.0–0.2)

## 2024-02-19 LAB — BASIC METABOLIC PANEL WITH GFR
Anion gap: 8 (ref 5–15)
BUN: 10 mg/dL (ref 6–20)
CO2: 24 mmol/L (ref 22–32)
Calcium: 8.8 mg/dL — ABNORMAL LOW (ref 8.9–10.3)
Chloride: 106 mmol/L (ref 98–111)
Creatinine, Ser: 0.96 mg/dL (ref 0.44–1.00)
GFR, Estimated: >60 mL/min (ref >60)
Glucose, Bld: 95 mg/dL (ref 70–99)
Potassium: 3.9 mmol/L (ref 3.5–5.1)
Sodium: 138 mmol/L (ref 135–145)

## 2024-02-19 MED ORDER — SODIUM CHLORIDE 0.9 % IV BOLUS
1000.0000 mL | Freq: Once | INTRAVENOUS | Status: AC
  Administered 2024-02-19: 1000 mL via INTRAVENOUS

## 2024-02-19 MED ORDER — METOCLOPRAMIDE HCL 5 MG/ML IJ SOLN
10.0000 mg | Freq: Once | INTRAMUSCULAR | Status: AC
  Administered 2024-02-19: 10 mg via INTRAVENOUS
  Filled 2024-02-19: qty 2

## 2024-02-19 MED ORDER — MECLIZINE HCL 25 MG PO TABS: 25.0000 mg | ORAL_TABLET | Freq: Three times a day (TID) | ORAL | 0 refills | Status: AC | PRN

## 2024-02-19 MED ORDER — DIPHENHYDRAMINE HCL 50 MG/ML IJ SOLN
12.5000 mg | Freq: Once | INTRAMUSCULAR | Status: AC
  Administered 2024-02-19: 12.5 mg via INTRAVENOUS
  Filled 2024-02-19: qty 1

## 2024-02-19 MED ORDER — KETOROLAC TROMETHAMINE 15 MG/ML IJ SOLN
15.0000 mg | Freq: Once | INTRAMUSCULAR | Status: AC
  Administered 2024-02-19: 15 mg via INTRAVENOUS
  Filled 2024-02-19: qty 1

## 2024-02-19 MED ORDER — MAGNESIUM SULFATE 2 GM/50ML IV SOLN
2.0000 g | Freq: Once | INTRAVENOUS | Status: AC
  Administered 2024-02-19: 2 g via INTRAVENOUS
  Filled 2024-02-19: qty 50

## 2024-02-19 MED ORDER — DEXAMETHASONE SODIUM PHOSPHATE 10 MG/ML IJ SOLN
8.0000 mg | Freq: Once | INTRAMUSCULAR | Status: AC
  Administered 2024-02-19: 8 mg via INTRAVENOUS
  Filled 2024-02-19: qty 1

## 2024-02-19 NOTE — ED Triage Notes (Signed)
 C/o headache onset yest , c/o photophobia , denies n/v

## 2024-02-19 NOTE — ED Provider Notes (Signed)
 [facility] AT [facility] Provider Note   CSN: [id number] Arrival date & time: 02/19/24  0740     History  Chief Complaint  Patient presents with   Migraine    Brittany Smith is a 48 y.o. female with no significant past medical history presents the ED today for migraine.  Patient reports left-sided headache that started yesterday afternoon.  She took oxycodone  at home with improvement of headache.  Today while waiting for room in the ED states that headache returned.  Endorses associated photophobia and dizziness, worse with laying to sitting position change.  Denies nausea or vomiting.  No neck stiffness, confusion, vision changes, or weakness.  Has a history of headaches in the past but states this is the worst when she is of her head.  Denies any head injury or trauma prior to onset of symptoms.    Home Medications Prior to Admission medications   Medication Sig Start Date End Date Taking? Authorizing Provider  meclizine  (ANTIVERT ) 25 MG tablet Take 1 tablet (25 mg total) by mouth 3 (three) times daily as needed for up to 7 days for dizziness. 02/19/24 02/26/24 Yes Sonnie Dusky, PA-C  amoxicillin -clavulanate (AUGMENTIN ) 875-125 MG tablet Take 1 tablet by mouth every 12 (twelve) hours. 03/17/23   Curatolo, Adam, DO  benzonatate  (TESSALON ) 100 MG capsule Take 1 capsule (100 mg total) by mouth 3 (three) times daily as needed for cough. 02/09/23   Jerilynn Montenegro, MD  diclofenac  Sodium (VOLTAREN ) 1 % GEL Apply 2 g topically 4 (four) times daily. 08/21/23   Adel Aden, PA-C  HYDROcodone -acetaminophen  (NORCO/VICODIN) 5-325 MG tablet Take 1 tablet by mouth every 6 (six) hours as needed. 08/21/23   Adel Aden, PA-C  levocetirizine (XYZAL) 5 MG tablet Take 5 mg by mouth at bedtime.    [provider]  methylPREDNISolone  (MEDROL  DOSEPAK) 4 MG TBPK tablet Follow package insert 12/20/23   Curatolo, Adam, DO  oxyCODONE  (ROXICODONE ) 5 MG  immediate release tablet Take 1 tablet (5 mg total) by mouth every 8 (eight) hours as needed for up to 10 doses for breakthrough pain. 12/20/23   Curatolo, Adam, DO      Allergies    Ceftriaxone  and Tramadol     Review of Systems   Review of Systems  Neurological:  Positive for headaches.  All other systems reviewed and are negative.   Physical Exam Updated Vital Signs BP 119/82 (BP Location: Right Arm) Comment: standing  Pulse 65   Temp 98.3 F (36.8 C) (Oral)   Resp 18   Ht 5\' 11"  (1.803 m)   Wt 72.6 kg   LMP 01/22/2020 (Exact Date)   SpO2 95%   BMI 22.32 kg/m  Physical Exam Vitals and nursing note reviewed.  Constitutional:      General: She is not in acute distress.    Appearance: Normal appearance.  HENT:     Head: Normocephalic and atraumatic.     Mouth/Throat:     Mouth: Mucous membranes are moist.  Eyes:     Conjunctiva/sclera: Conjunctivae normal.     Pupils: Pupils are equal, round, and reactive to light.  Cardiovascular:     Rate and Rhythm: Normal rate and regular rhythm.     Pulses: Normal pulses.     Heart sounds: Normal heart sounds.  Pulmonary:     Effort: Pulmonary effort is normal.     Breath sounds: Normal breath sounds.  Abdominal:     Palpations: Abdomen is soft.  Tenderness: There is no abdominal tenderness.  Musculoskeletal:        General: Normal range of motion.     Cervical back: Normal range of motion.  Skin:    General: Skin is warm and dry.     Findings: No rash.  Neurological:     General: No focal deficit present.     Mental Status: She is alert.     Sensory: No sensory deficit.     Motor: No weakness.  Psychiatric:        Mood and Affect: Mood normal.        Behavior: Behavior normal.    Orthostatic Vital Signs Laying Pulse Rate: 59 Abnormal  BP: 110/57 Abnormal  Sitting Pulse Rate: 50 Abnormal  BP: 119/78  Standing Pulse Rate: 65 BP: 119/82 ED Results / Procedures / Treatments   Labs (all labs ordered are  listed, but only abnormal results are displayed) Labs Reviewed  BASIC METABOLIC PANEL WITH GFR - Abnormal; Notable for the following components:      Result Value   Calcium 8.8 (*)    All other components within normal limits  CBC WITH DIFFERENTIAL/PLATELET - Abnormal; Notable for the following components:   WBC 10.7 (*)    All other components within normal limits    EKG None  Radiology CT Head Wo Contrast Result Date: 02/19/2024 CLINICAL DATA:  48 year old female with increasing headache since yesterday. Photophobia. EXAM: CT HEAD WITHOUT CONTRAST TECHNIQUE: Contiguous axial images were obtained from the base of the skull through the vertex without intravenous contrast. RADIATION DOSE REDUCTION: This exam was performed according to the departmental dose-optimization program which includes automated exposure control, adjustment of the mA and/or kV according to patient size and/or use of iterative reconstruction technique. COMPARISON:  Head CT 05/04/2016. FINDINGS: Brain: Cerebral volume remains normal. Wig related streak artifact in the superior hemispheres. No midline shift, ventriculomegaly, mass effect, evidence of mass lesion, intracranial hemorrhage or evidence of cortically based acute infarction. Allowing for artifact gray-white matter differentiation is within normal limits throughout the brain. Basilar cisterns remain normal. Vascular: No suspicious intracranial vascular hyperdensity. Skull: Stable and intact.  No acute osseous abnormality identified. Sinuses/Orbits: Visualized paranasal sinuses and mastoids are clear. Other: Rightward gaze. Otherwise stable and negative orbit and scalp soft tissues. IMPRESSION: External streak artifact along the superior head. But allowing for this Stable since 2017 and normal noncontrast Head CT. Electronically Signed   By: Marlise Simpers M.D.   On: 02/19/2024 09:05    Procedures Procedures    Medications Ordered in ED Medications  sodium chloride  0.9 %  bolus 1,000 mL (0 mLs Intravenous Stopped 02/19/24 1245)  ketorolac  (TORADOL ) 15 MG/ML injection 15 mg (15 mg Intravenous Given 02/19/24 0855)  metoCLOPramide  (REGLAN ) injection 10 mg (10 mg Intravenous Given 02/19/24 0855)  diphenhydrAMINE  (BENADRYL ) injection 12.5 mg (12.5 mg Intravenous Given 02/19/24 0855)  dexamethasone  (DECADRON ) injection 8 mg (8 mg Intravenous Given 02/19/24 0855)  magnesium  sulfate IVPB 2 g 50 mL (0 g Intravenous Stopped 02/19/24 1248)  sodium chloride  0.9 % bolus 1,000 mL (0 mLs Intravenous Stopped 02/19/24 1245)    ED Course/ Medical Decision Making/ A&P                                 Medical Decision Making Amount and/or Complexity of Data Reviewed Labs: ordered. Radiology: ordered.  Risk Prescription drug management.   This patient presents to the ED  for concern of headache, this involves an extensive number of treatment options, and is a complaint that carries with it a high risk of complications and morbidity.   Differential diagnosis includes: electrolyte abnormality, dehydration, tension headache, cluster headache, migraine, etc. Low suspicion for stroke - no acute focal neuro deficits.   Comorbidities  No significant past medical history   Additional History  Additional history obtained from prior records   Lab Tests  I ordered and personally interpreted labs.  The pertinent results include:   BMP and CBC are reassuring   Imaging Studies  I ordered imaging studies including CT head  I independently visualized and interpreted imaging which showed:  Normal non-contrast head CT I agree with the radiologist interpretation   Problem List / ED Course / Critical Interventions / Medication Management  Patient reports a headache at the left temple that began yesterday with associated photophobia and dizziness. I ordered medications including: Toradol , Reglan , Benadryl , Decadron , and NS for headache  Reevaluation of the patient after these  medicines showed that the patient improved. Patient able to ambulate independently. Orthostatic vital signs are normal.  Dizziness could be second to vertigo or could be second to the migraine.  Prescription for meclizine  sent to the pharmacy for patient to take as needed. Advise close primary care follow-up for reevaluation. I have reviewed the patients home medicines and have made adjustments as needed   Social Determinants of Health  Tobacco use   Test / Admission - Considered  Discussed finds with patient.  All questions were answered. She is stable and safe discharge home. Return precautions given.       Final Clinical Impression(s) / ED Diagnoses Final diagnoses:  Migraine without status migrainosus, not intractable, unspecified migraine type    Rx / DC Orders ED Discharge Orders          Ordered    meclizine  (ANTIVERT ) 25 MG tablet  3 times daily PRN        02/19/24 1127              Sonnie Dusky, PA-C 02/19/24 1425    Ninetta Basket, MD 02/20/24 1418

## 2024-02-19 NOTE — Discharge Instructions (Addendum)
 Your labs and imaging are reassuring. Follow up with your PCP in the next week for reevaluation.  Take Meclizine  up to 3 times a day as needed for dizziness.   Get help right away if: Your migraine headache becomes severe or lasts more than 72 hours. You have a fever or stiff neck. You have vision loss. Your muscles feel weak or like you cannot control them. You lose your balance often or have trouble walking. You faint. You have a seizure.

## 2024-04-18 ENCOUNTER — Ambulatory Visit (INDEPENDENT_AMBULATORY_CARE_PROVIDER_SITE_OTHER): Payer: Self-pay | Admitting: Neurology

## 2024-04-18 ENCOUNTER — Encounter: Payer: Self-pay | Admitting: Neurology

## 2024-04-18 VITALS — BP 110/82 | Resp 16 | Ht 70.5 in | Wt 164.5 lb

## 2024-04-18 DIAGNOSIS — R202 Paresthesia of skin: Secondary | ICD-10-CM

## 2024-04-18 MED ORDER — GABAPENTIN 300 MG PO CAPS: 300.0000 mg | ORAL_CAPSULE | Freq: Three times a day (TID) | ORAL | 3 refills | Status: AC | PRN

## 2024-04-18 NOTE — Progress Notes (Signed)
 Chief Complaint  Patient presents with   New Patient (Initial Visit)    Rm, alone, internal ED referral for R wrist pain, CTS: pt stated that she has had issues with her hand going numb and falling asleep for 2 + years and seeing urgent care and er in the past       ASSESSMENT AND PLAN  Brittany Smith is a 48 y.o. female   Bilateral hands paresthesia  Right wrist Tinel sign   Most consistent with bilateral carpal tunnel syndromes,  She also has multiple joints pain, elevated WBC in the lab test in the past, lab evaluation today, including inflammatory markers  EMG nerve conduction study  Wrist splint  Gabapentin  as needed, may combine with Aleve  as needed  DIAGNOSTIC DATA (LABS, IMAGING, TESTING) - I reviewed patient records, labs, notes, testing and imaging myself where available.   MEDICAL HISTORY:  Brittany Smith is a 48 year old female, seen in request for bilateral hands paresthesia, initial evaluation April 18, 2024    History is obtained from the patient and review of electronic medical records. I personally reviewed pertinent available imaging films in PACS.   PMHx of  Allergy Hx of  knee surgery  She works at a retail job, packing and shipping guns and ammunition, up to about 10 pounds, she denied difficulty doing her job, denied neck pain  Since 2023, she noticed intermittent bilateral hands paresthesia, also wake her up at night, especially on the right side, 3 nights out of the week she is awoken by her bilateral hands paresthesia, she has to shake her hands, she denies weakness, denied gait abnormality  She has multiple joints pain, elbow, shoulder, knees, had a history of bilateral knee arthroscopic surgery in the past  Lab evaluation in May 2025 showed mild elevation of WBC 10.7,  PHYSICAL EXAM:   Vitals:   04/18/24 0935  BP: 110/82  Resp: 16  Weight: 164 lb 8 oz (74.6 kg)  Height: 5' 10.5 (1.791 m)   Body mass index is 23.27 kg/m.  PHYSICAL  EXAMNIATION:  Gen: NAD, conversant, well nourised, well groomed                     Cardiovascular: Regular rate rhythm, no peripheral edema, warm, nontender. Eyes: Conjunctivae clear without exudates or hemorrhage Neck: Supple, no carotid bruits. Pulmonary: Clear to auscultation bilaterally   NEUROLOGICAL EXAM:  MENTAL STATUS: Speech/cognition: Depressed looking middle-age female, awake, alert, oriented to history taking and casual conversation CRANIAL NERVES: CN II: Visual fields are full to confrontation. Pupils are round equal and briskly reactive to light. CN III, IV, VI: extraocular movement are normal. No ptosis. CN V: Facial sensation is intact to light touch CN VII: Face is symmetric with normal eye closure  CN VIII: Hearing is normal to causal conversation. CN IX, X: Phonation is normal. CN XI: Head turning and shoulder shrug are intact  MOTOR: There is no pronator drift of out-stretched arms. Muscle bulk and tone are normal. Muscle strength is normal.  Right wrist Tinel's sign  REFLEXES: Reflexes are 2+ and symmetric at the biceps, triceps, knees, and ankles. Plantar responses are flexor.  SENSORY: Intact to light touch, pinprick and vibratory sensation are intact in fingers and toes, with exception of decreased pinprick at first 3 finger pads  COORDINATION: There is no trunk or limb dysmetria noted.  GAIT/STANCE: Posture is normal. Gait is steady with normal steps, base, arm swing, and turning. Heel and toe walking are  normal. Tandem gait is normal.  Romberg is absent.  REVIEW OF SYSTEMS:  Full 14 system review of systems performed and notable only for as above All other review of systems were negative.   ALLERGIES: Allergies  Allergen Reactions   Ceftriaxone  Itching   Tramadol  Nausea And Vomiting and Other (See Comments)    Drowsy     HOME MEDICATIONS: Current Outpatient Medications  Medication Sig Dispense Refill   loratadine (CLARITIN) 10 MG tablet  Take 10 mg by mouth daily.     No current facility-administered medications for this visit.    PAST MEDICAL HISTORY: Past Medical History:  Diagnosis Date   Anxiety    Arthritis    Elevated cholesterol    slightly high but no meds recc    Pregnancy induced hypertension    Vaginal Pap smear, abnormal    Vitamin D deficiency     PAST SURGICAL HISTORY: Past Surgical History:  Procedure Laterality Date   ANTERIOR CRUCIATE LIGAMENT REPAIR     BILATERAL SALPINGECTOMY     BREAST BIOPSY Right 09/28/2018   x2   BREAST BIOPSY Left 11/07/2018   x2   KNEE ARTHROSCOPY WITH ANTERIOR CRUCIATE LIGAMENT (ACL) REPAIR     KNEE ARTHROSCOPY WITH LATERAL MENISECTOMY Left 02/22/2019   Procedure: KNEE ARTHROSCOPY WITH LATERAL MENISECTOMY AND CHONDROPLASTY;  Surgeon: Shari Sieving, MD;  Location: [facility];  Service: Orthopedics;  Laterality: Left;   LAPAROSCOPY      they scraped some cells     TUBAL LIGATION     VAGINAL HYSTERECTOMY N/A 02/25/2020   Procedure: HYSTERECTOMY VAGINAL;  Surgeon: Lorence Ozell CROME, MD;  Location: [facility] OR;  Service: Gynecology;  Laterality: N/A;    FAMILY HISTORY: Family History  Problem Relation Age of Onset   Healthy Mother    Healthy Father     SOCIAL HISTORY: Social History   Socioeconomic History   Marital status: Married    Spouse name: Not on file   Number of children: 2   Years of education: Not on file   Highest education level: GED or equivalent  Occupational History   Not on file  Tobacco Use   Smoking status: Former    Current packs/day: 0.00    Types: Cigarettes    Quit date: 10/05/2012    Years since quitting: 11.5   Smokeless tobacco: Never  Vaping Use   Vaping status: Never Used  Substance and Sexual Activity   Alcohol use: Yes    Comment: occasionally- weekends   Drug use: No   Sexual activity: Yes    Birth control/protection: Surgical    Comment: tubal ligation  Other Topics Concern   Not on file  Social  History Narrative   Not on file   Social Drivers of Health   Financial Resource Strain: Not on file  Food Insecurity: Not on file  Transportation Needs: No Transportation Needs (09/21/2018)   PRAPARE - Administrator, Civil Service (Medical): No    Lack of Transportation (Non-Medical): No  Physical Activity: Not on file  Stress: Not on file  Social Connections: Not on file  Intimate Partner Violence: Not on file      Modena Callander, M.D. Ph.D.  [facility] [address], [address] [place], [place] [address] Ph: [phone] Fax: [phone]  CC:  Center, [facility] [address] [place],  [place] [address]  Patient, No Pcp Per

## 2024-04-19 ENCOUNTER — Ambulatory Visit: Payer: Self-pay | Admitting: Neurology

## 2024-04-19 LAB — CBC WITH DIFFERENTIAL/PLATELET
Basophils Absolute: 0.1 x10E3/uL (ref 0.0–0.2)
Basos: 1 %
EOS (ABSOLUTE): 0.2 x10E3/uL (ref 0.0–0.4)
Eos: 3 %
Hematocrit: 47.1 % — ABNORMAL HIGH (ref 34.0–46.6)
Hemoglobin: 14.7 g/dL (ref 11.1–15.9)
Immature Grans (Abs): 0 x10E3/uL (ref 0.0–0.1)
Immature Granulocytes: 0 %
Lymphocytes Absolute: 1.9 x10E3/uL (ref 0.7–3.1)
Lymphs: 25 %
MCH: 28.3 pg (ref 26.6–33.0)
MCHC: 31.2 g/dL — ABNORMAL LOW (ref 31.5–35.7)
MCV: 91 fL (ref 79–97)
Monocytes Absolute: 0.6 x10E3/uL (ref 0.1–0.9)
Monocytes: 8 %
Neutrophils Absolute: 4.5 x10E3/uL (ref 1.4–7.0)
Neutrophils: 63 %
Platelets: 344 x10E3/uL (ref 150–450)
RBC: 5.19 x10E6/uL (ref 3.77–5.28)
RDW: 13.4 % (ref 11.7–15.4)
WBC: 7.3 x10E3/uL (ref 3.4–10.8)

## 2024-04-19 LAB — RPR: RPR Ser Ql: NONREACTIVE

## 2024-04-19 LAB — C-REACTIVE PROTEIN: CRP: 2 mg/L (ref 0–10)

## 2024-04-19 LAB — HIV ANTIBODY (ROUTINE TESTING W REFLEX): HIV Screen 4th Generation wRfx: NONREACTIVE

## 2024-04-19 LAB — TSH: TSH: 0.566 u[IU]/mL (ref 0.450–4.500)

## 2024-04-19 LAB — VITAMIN B12: Vitamin B-12: 803 pg/mL (ref 232–1245)

## 2024-04-19 LAB — SEDIMENTATION RATE: Sed Rate: 2 mm/h (ref 0–32)

## 2024-04-19 LAB — ANA W/REFLEX IF POSITIVE: Anti Nuclear Antibody (ANA): NEGATIVE

## 2024-04-19 LAB — RHEUMATOID FACTOR: Rheumatoid fact SerPl-aCnc: <10 [IU]/mL (ref <14.0)

## 2024-06-13 ENCOUNTER — Ambulatory Visit
Admission: EM | Admit: 2024-06-13 | Discharge: 2024-06-13 | Disposition: A | Discharge status: Home / Self Care | Attending: Physician Assistant | Admitting: Physician Assistant

## 2024-06-13 ENCOUNTER — Other Ambulatory Visit: Payer: Self-pay

## 2024-06-13 DIAGNOSIS — M79641 Pain in right hand: Secondary | ICD-10-CM | POA: Diagnosis not present

## 2024-06-13 DIAGNOSIS — M79642 Pain in left hand: Secondary | ICD-10-CM

## 2024-06-13 MED ORDER — KETOROLAC TROMETHAMINE 30 MG/ML IJ SOLN
30.0000 mg | Freq: Once | INTRAMUSCULAR | Status: AC
  Administered 2024-06-13: 30 mg via INTRAMUSCULAR

## 2024-06-13 NOTE — Discharge Instructions (Addendum)
 You were seen today for concerns of hand pain. I suspect that your right hand pain is likely due to carpal tunnel syndrome but you have an apt tomorrow with Neurology to test your nerve conduction.  Due to your pain and discomfort we are giving you a Toradol  30 mg injection here in clinic to help with the pain and I recommend taking your prescribed medications once you get home so you can rest. Please to not mix sedating medications (gabapentin  and oxycodone ) as this can cause respiratory depression. I am including Orthopedic clinic contact information in your paperwork so you can reach out to them for monitoring and ongoing management after your Neurology visit if indicated.

## 2024-06-13 NOTE — ED Provider Notes (Signed)
 [facility]    CSN: [id number] Arrival date & time: 06/13/24  0931      History   Chief Complaint Chief Complaint  Patient presents with   Hand Pain    HPI Brittany Smith is a 48 y.o. female.   HPI  Pt is here today for concerns of bilateral hand pain that has become steadily worse over the last few weeks and was unbearable last night.  She reports numbness in the right hand and tingling sensation. She states the pain is largely in the thumb, index and middle finger. She states the right hand feels like pins and needles and gets worse to the point where she wakes up and cries. She reports even sleeping in brace does not help.  Last thing she took for this was Gabapentin  on Monday night. She states she has not slept over 6 hours in the past few weeks due to pain and discomfort in the right hand. She states she is unable to go to work due to sleep problems and pain.   She states the left hand feels sore, like I jammed it since Friday. She reports having this sensation along the base of the left thumb She denies aggravating factors in the left hand. She states stretching and massaging it seem to help.   Interventions: Thumb spica splint on right hand. She has tried taking Oxycodone  but it makes her sleepy and she can't work while taking it. She has not tried OTC medications   She has nerve conduction study scheduled for tomorrow with Neurology for this concern. She has not gotten definitive carpal tunnel syndrome dx.   Past Medical History:  Diagnosis Date   Anxiety    Arthritis    Elevated cholesterol    slightly high but no meds recc    Pregnancy induced hypertension    Vaginal Pap smear, abnormal    Vitamin D deficiency     Patient Active Problem List   Diagnosis Date Noted   Paresthesia 04/18/2024   Post-operative state 02/25/2020   Papilloma of both breasts 07/09/2019    Past Surgical History:  Procedure Laterality Date   ANTERIOR CRUCIATE  LIGAMENT REPAIR     BILATERAL SALPINGECTOMY     BREAST BIOPSY Right 09/28/2018   x2   BREAST BIOPSY Left 11/07/2018   x2   KNEE ARTHROSCOPY WITH ANTERIOR CRUCIATE LIGAMENT (ACL) REPAIR     KNEE ARTHROSCOPY WITH LATERAL MENISECTOMY Left 02/22/2019   Procedure: KNEE ARTHROSCOPY WITH LATERAL MENISECTOMY AND CHONDROPLASTY;  Surgeon: Shari Sieving, MD;  Location: [facility];  Service: Orthopedics;  Laterality: Left;   LAPAROSCOPY      they scraped some cells     TUBAL LIGATION     VAGINAL HYSTERECTOMY N/A 02/25/2020   Procedure: HYSTERECTOMY VAGINAL;  Surgeon: Lorence Ozell CROME, MD;  Location: [facility];  Service: Gynecology;  Laterality: N/A;    OB History     Gravida  3   Para  1   Term      Preterm  1   AB  1   Living  2      SAB      IAB      Ectopic  1   Multiple      Live Births  2            Home Medications    Prior to Admission medications   Medication Sig Start Date End Date Taking? Authorizing Provider  gabapentin  (NEURONTIN ) 300  MG capsule Take 1 capsule (300 mg total) by mouth 3 (three) times daily as needed. 04/18/24   Onita Duos, MD  loratadine (CLARITIN) 10 MG tablet Take 10 mg by mouth daily.    [provider]    Family History Family History  Problem Relation Age of Onset   Healthy Mother    Healthy Father     Social History Social History   Tobacco Use   Smoking status: Former    Current packs/day: 0.00    Types: Cigarettes    Quit date: 10/05/2012    Years since quitting: 11.6   Smokeless tobacco: Never  Vaping Use   Vaping status: Never Used  Substance Use Topics   Alcohol use: Yes    Comment: occasionally- weekends   Drug use: No     Allergies   Ceftriaxone  and Tramadol    Review of Systems Review of Systems  Musculoskeletal:  Positive for arthralgias.  Neurological:  Positive for numbness.     Physical Exam Triage Vital Signs ED Triage Vitals  Encounter Vitals Group     BP 06/13/24  1004 114/75     Girls Systolic BP Percentile --      Girls Diastolic BP Percentile --      Boys Systolic BP Percentile --      Boys Diastolic BP Percentile --      Pulse Rate 06/13/24 1004 69     Resp 06/13/24 1004 19     Temp 06/13/24 1004 98.2 F (36.8 C)     Temp Source 06/13/24 1004 Oral     SpO2 06/13/24 1004 96 %     Weight 06/13/24 1006 168 lb (76.2 kg)     Height 06/13/24 1006 5' 10 (1.778 m)     Head Circumference --      Peak Flow --      Pain Score 06/13/24 1005 8     Pain Loc --      Pain Education --      Exclude from Growth Chart --    No data found.  Updated Vital Signs BP 114/75 (BP Location: Right Arm)   Pulse 69   Temp 98.2 F (36.8 C) (Oral)   Resp 19   Ht 5' 10 (1.778 m)   Wt 168 lb (76.2 kg)   LMP 01/22/2020 (Exact Date)   SpO2 96%   BMI 24.11 kg/m   Visual Acuity Right Eye Distance:   Left Eye Distance:   Bilateral Distance:    Right Eye Near:   Left Eye Near:    Bilateral Near:     Physical Exam Vitals reviewed.  Constitutional:      General: She is awake.     Appearance: Normal appearance. She is well-developed and well-groomed.  HENT:     Head: Normocephalic and atraumatic.  Eyes:     General: Lids are normal. Gaze aligned appropriately.     Extraocular Movements: Extraocular movements intact.     Conjunctiva/sclera: Conjunctivae normal.  Cardiovascular:     Pulses:          Radial pulses are 2+ on the right side and 2+ on the left side.  Pulmonary:     Effort: Pulmonary effort is normal.  Musculoskeletal:     Right hand: Tenderness present. No swelling or deformity. Normal range of motion. Normal strength. Decreased sensation of the median distribution. Normal capillary refill. Normal pulse.     Left hand: No swelling, deformity or tenderness. Normal range of motion.  Normal strength. Normal capillary refill. Normal pulse.       Hands:     Comments: Pt has tingling in right thumb, index and middle fingers with Tinel's sign  testing.   She reports tenderness with checking left radial pulse.  Negative finklestein's testing bilaterally .  Neurological:     Mental Status: She is alert and oriented to person, place, and time.  Psychiatric:        Attention and Perception: Attention and perception normal.        Mood and Affect: Mood and affect normal.        Speech: Speech normal.        Behavior: Behavior normal. Behavior is cooperative.      UC Treatments / Results  Labs (all labs ordered are listed, but only abnormal results are displayed) Labs Reviewed - No data to display  EKG   Radiology No results found.  Procedures Procedures (including critical care time)  Medications Ordered in UC Medications  ketorolac  (TORADOL ) 30 MG/ML injection 30 mg (30 mg Intramuscular Given 06/13/24 1044)    Initial Impression / Assessment and Plan / UC Course  I have reviewed the triage vital signs and the nursing notes.  Pertinent labs & imaging results that were available during my care of the patient were reviewed by me and considered in my medical decision making (see chart for details).      Final Clinical Impressions(s) / UC Diagnoses   Final diagnoses:  Bilateral hand pain   Patient presents today with concerns for bilateral hand pain.  She reports that the right hand is hurting worse than the left and is tingling and numb.  She states that this has been ongoing for about 10 years but over the last few weeks has been getting worse and last night was unbearable causing lack of sleep.  She states that she has tried taking gabapentin  and oxycodone  but these make her sleepy and she is unable to perform her typical daily activities due to drowsiness.  Physical exam is notable for a positive Tinel sign on the right hand and patient appears visibly uncomfortable despite wearing a thumb spica splint on the right.  She also reports tenderness of the left hand at the base of the thumb.  She reports tenderness with  palpation of the left radial pulse.  At this time I suspect right hand pain and discomfort may be due to carpal tunnel syndrome.  Patient has upcoming nerve conduction study with neurology tomorrow but is concerned that she has not had any sleep over the last few days.  Will provide Toradol  30 mg injection today in clinic to assist with discomfort.  Recommend the patient rest for today and takes prescribed medications as directed for further symptomatic relief.  Will provide contact information for nearby orthopedic clinics for follow-up if deemed appropriate.  Recommend close follow-up with neurology for ongoing management.    Discharge Instructions      You were seen today for concerns of hand pain. I suspect that your right hand pain is likely due to carpal tunnel syndrome but you have an apt tomorrow with Neurology to test your nerve conduction.  Due to your pain and discomfort we are giving you a Toradol  30 mg injection here in clinic to help with the pain and I recommend taking your prescribed medications once you get home so you can rest. Please to not mix sedating medications (gabapentin  and oxycodone ) as this can cause respiratory depression. I  am including Orthopedic clinic contact information in your paperwork so you can reach out to them for monitoring and ongoing management after your Neurology visit if indicated.      ED Prescriptions   None    PDMP not reviewed this encounter.   Phyllicia Dudek, Rocky BRAVO, PA-C 06/13/24 1104

## 2024-06-13 NOTE — ED Triage Notes (Addendum)
 Pt presents with a chief complaint of bilateral hand pain. States she has nerve conduction study tomorrow morning at neurologist. Pain has been steady over the last few weeks but pain became unbearable last night. Currently rates overall pain an 8/10. Pt reports her right hand feels numb and it is tingling. Very restless in triage. States she stayed up most of the night. Wearing thumb spica on right hand. Prevents the pain from becoming worse but is becoming less effective.   Oxycodone  taken for pain. Last dose was two weeks ago. Makes pt drowsy. Hot/cold interventions tried, no relief.

## 2024-06-14 ENCOUNTER — Ambulatory Visit: Admitting: Neurology

## 2024-06-14 VITALS — BP 115/70 | HR 98 | Ht 70.0 in | Wt 164.0 lb

## 2024-06-14 DIAGNOSIS — R202 Paresthesia of skin: Secondary | ICD-10-CM | POA: Diagnosis not present

## 2024-06-14 DIAGNOSIS — G5603 Carpal tunnel syndrome, bilateral upper limbs: Secondary | ICD-10-CM | POA: Diagnosis not present

## 2024-06-14 NOTE — Progress Notes (Signed)
 Chief Complaint  Patient presents with   NCS    EMG Rm 4 alone  Pt is well       ASSESSMENT AND PLAN  Brittany Smith is a 48 y.o. female   Moderate carpal tunnel syndromes  EMG nerve conduction study confirmed moderate carpal tunnel bilaterally right worse than left, demyelinating, no evidence of axonal loss.  Laboratory evaluation for systemic inflammatory markers were negative,  Discussed with patient treatment options, agree conservative treatment, may combine wrist splint, NSAIDs, gabapentin  as needed to get better night sleep,  She will contact office for hand surgeon referral if above failed  DIAGNOSTIC DATA (LABS, IMAGING, TESTING) - I reviewed patient records, labs, notes, testing and imaging myself where available.   MEDICAL HISTORY:  Brittany Smith is a 48 year old female, seen in request for bilateral hands paresthesia, initial evaluation April 18, 2024    History is obtained from the patient and review of electronic medical records. I personally reviewed pertinent available imaging films in PACS.   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, she denied difficulty doing her job, denied neck pain  Since 2023, she noticed intermittent bilateral hands paresthesia, also wake her up at night, especially on the right side, 3 nights out of the week she is awoken by her bilateral hands paresthesia, she has to shake her hands, she denies weakness, denied gait abnormality  She has multiple joints pain, elbow, shoulder, knees, had a history of bilateral knee arthroscopic surgery in the past  Lab evaluation in May 2025 showed mild elevation of WBC 10.7,  UPDATE Jun 14 2024: She return for electrodiagnostic study today, which showed evidence of moderate bilateral carpal tunnel syndromes, right worse than left, demyelinating, no evidence of axonal loss.  She complains of slow worsening  bilateral hands paresthesia, woke her up almost every night, finger numbness tingling difficult to find a comfortable position, has tried wrist splint with limited help, I giving her the prescription of gabapentin , does make her sleepy,   Laboratory evaluation reviewed, normal CBC, CMP, TSH, negative inflammatory markers including ESR C-reactive protein ANA rheumatoid factor  PHYSICAL EXAM:   Vitals:   06/14/24 0916  BP: 115/70  Pulse: 98  Weight: 164 lb (74.4 kg)  Height: 5' 10 (1.778 m)   Body mass index is 23.53 kg/m.  PHYSICAL EXAMNIATION:  Gen: NAD, conversant, well nourised, well groomed           NEUROLOGICAL EXAM:  MENTAL STATUS: Speech/cognition:  middle-age female, awake, alert, oriented to history taking and casual conversation CRANIAL NERVES: CN II: Visual fields are full to confrontation. Pupils are round equal and briskly reactive to light. CN III, IV, VI: extraocular movement are normal. No ptosis. CN V: Facial sensation is intact to light touch CN VII: Face is symmetric with normal eye closure  CN VIII: Hearing is normal to causal conversation. CN IX, X: Phonation is normal. CN XI: Head turning and shoulder shrug are intact  MOTOR: There is no pronator drift of out-stretched arms. Muscle bulk and tone are normal. Muscle strength is normal.  Right wrist Tinel's sign  REFLEXES: Reflexes are 2+ and symmetric at the biceps, triceps, knees, and ankles. Plantar responses are flexor.  SENSORY: Intact to light touch, pinprick and vibratory sensation are intact in fingers and toes, with exception of decreased pinprick at first 3 finger pads  COORDINATION: There is  no trunk or limb dysmetria noted.  GAIT/STANCE: Posture is normal. Gait is steady    REVIEW OF SYSTEMS:  Full 14 system review of systems performed and notable only for as above All other review of systems were negative.   ALLERGIES: Allergies  Allergen Reactions   Ceftriaxone  Itching    Tramadol  Nausea And Vomiting and Other (See Comments)    Drowsy     HOME MEDICATIONS: Current Outpatient Medications  Medication Sig Dispense Refill   gabapentin  (NEURONTIN ) 300 MG capsule Take 1 capsule (300 mg total) by mouth 3 (three) times daily as needed. 90 capsule 3   loratadine (CLARITIN) 10 MG tablet Take 10 mg by mouth daily.     No current facility-administered medications for this visit.    PAST MEDICAL HISTORY: Past Medical History:  Diagnosis Date   Anxiety    Arthritis    Elevated cholesterol    slightly high but no meds recc    Pregnancy induced hypertension    Vaginal Pap smear, abnormal    Vitamin D deficiency     PAST SURGICAL HISTORY: Past Surgical History:  Procedure Laterality Date   ANTERIOR CRUCIATE LIGAMENT REPAIR     BILATERAL SALPINGECTOMY     BREAST BIOPSY Right 09/28/2018   x2   BREAST BIOPSY Left 11/07/2018   x2   KNEE ARTHROSCOPY WITH ANTERIOR CRUCIATE LIGAMENT (ACL) REPAIR     KNEE ARTHROSCOPY WITH LATERAL MENISECTOMY Left 02/22/2019   Procedure: KNEE ARTHROSCOPY WITH LATERAL MENISECTOMY AND CHONDROPLASTY;  Surgeon: Shari Sieving, MD;  Location: [facility];  Service: Orthopedics;  Laterality: Left;   LAPAROSCOPY      they scraped some cells     TUBAL LIGATION     VAGINAL HYSTERECTOMY N/A 02/25/2020   Procedure: HYSTERECTOMY VAGINAL;  Surgeon: Lorence Ozell CROME, MD;  Location: [facility];  Service: Gynecology;  Laterality: N/A;    FAMILY HISTORY: Family History  Problem Relation Age of Onset   Healthy Mother    Healthy Father     SOCIAL HISTORY: Social History   Socioeconomic History   Marital status: Married    Spouse name: Not on file   Number of children: 2   Years of education: Not on file   Highest education level: GED or equivalent  Occupational History   Not on file  Tobacco Use   Smoking status: Former    Current packs/day: 0.00    Types: Cigarettes    Quit date: 10/05/2012    Years since quitting:  11.6   Smokeless tobacco: Never  Vaping Use   Vaping status: Never Used  Substance and Sexual Activity   Alcohol use: Yes    Comment: occasionally- weekends   Drug use: No   Sexual activity: Yes    Birth control/protection: Surgical    Comment: tubal ligation  Other Topics Concern   Not on file  Social History Narrative   Not on file   Social Drivers of Health   Financial Resource Strain: Not on file  Food Insecurity: Not on file  Transportation Needs: No Transportation Needs (09/21/2018)   PRAPARE - Administrator, Civil Service (Medical): No    Lack of Transportation (Non-Medical): No  Physical Activity: Not on file  Stress: Not on file  Social Connections: Not on file  Intimate Partner Violence: Not on file      Modena Callander, M.D. Ph.D.  [facility] [address] [place], [place] [address] Ph: [phone] Fax: [phone]  CC:  No referring provider defined for this encounter.  Patient, No Pcp Per

## 2024-06-14 NOTE — Procedures (Signed)
 Full Name: Brittany Smith Gender: Female MRN #: [id number] Date of Birth: October 21, 1975    Visit Date: 06/14/2024 09:32 Age: 48 Years Examining Physician: Onita Duos Referring Physician: Onita Duos Height: 5 feet 10 inch History: 48 year old female with a few years history of worsening bilateral hands paresthesia,  Summary of the test: Nerve conduction study: Bilateral ulnar sensory and motor responses were normal.  Bilateral median sensory response showed mild to moderately prolonged peak latency, right side is worse, also has moderately decreased snap amplitude.  Bilateral median motor responses showed mildly prolonged distal latency, with well-preserved CMAP amplitude, right worse than left  Electromyography: Selected needle examination of right upper extremity and bilateral abductor pollicis brevis were performed.  There was no spontaneous activity at the abductor pollicis brevis, fairly normal motor unit morphology, with mildly decreased recruitment.  Conclusion: This is an abnormal study.  There is electrodiagnostic evidence of bilateral median neuropathy across the wrist, consistent with moderate bilateral carpal tunnel syndromes, demyelinating in nature.  There was no evidence of axonal loss.    ------------------------------- Duos Onita. M.D. Ph.D.   [facility] [address], [address] [place], [place] [address] Tel: [phone] Fax: [phone]  Verbal informed consent was obtained from the patient, patient was informed of potential risk of procedure, including bruising, bleeding, hematoma formation, infection, muscle weakness, muscle pain, numbness, among others.        MNC    Nerve / Sites Muscle Latency Ref. Amplitude Ref. Rel Amp Segments Distance Velocity Ref. Area    ms ms mV mV %  cm m/s m/s mVms  L Median - APB     Wrist APB 4.2 <=4.4 7.9 >=4.0 100 Wrist - APB 7   24.4     Upper arm APB 9.2  6.6  83.1 Upper arm - Wrist 29 58  >=49 22.0  R Median - APB     Wrist APB 5.2 <=4.4 6.8 >=4.0 100 Wrist - APB 7   23.5     Upper arm APB 10.1  5.9  86.3 Upper arm - Wrist 25 51 >=49 21.7  L Ulnar - ADM     Wrist ADM 2.2 <=3.3 9.0 >=6.0 100 Wrist - ADM 7   28.8     B.Elbow ADM 4.6  8.3  92.7 B.Elbow - Wrist 16 69 >=49 27.5     A.Elbow ADM 6.8  8.3  99.6 A.Elbow - B.Elbow 14 64 >=49 28.2  R Ulnar - ADM     Wrist ADM 2.3 <=3.3 8.8 >=6.0 100 Wrist - ADM 7   26.4     B.Elbow ADM 4.7  8.7  98.3 B.Elbow - Wrist 16 65 >=49 26.2     A.Elbow ADM 7.4  8.2  95 A.Elbow - B.Elbow 15 57 >=49 26.3             SNC    Nerve / Sites Rec. Site Peak Lat Ref.  Amp Ref. Segments Distance    ms ms V V  cm  R Median - Orthodromic (Dig II, Mid palm)     Dig II Wrist 4.3 <=3.4 6 >=10 Dig II - Wrist 13  L Median - Orthodromic (Dig II, Mid palm)     Dig II Wrist 3.9 <=3.4 12 >=10 Dig II - Wrist 13  R Ulnar - Orthodromic, (Dig V, Mid palm)     Dig V Wrist 2.4 <=3.1 16 >=5 Dig V - Wrist 11  L Ulnar -  Orthodromic, (Dig V, Mid palm)     Dig V Wrist 2.3 <=3.1 12 >=5 Dig V - Wrist 53             F  Wave    Nerve F Lat Ref.   ms ms  L Ulnar - ADM 29.1 <=32.0  R Ulnar - ADM 27.8 <=32.0         EMG Summary Table    Spontaneous MUAP Recruitment  Muscle IA Fib PSW Fasc Other Amp Dur. Poly Pattern  R. First dorsal interosseous Normal None None None _______ Normal Normal Normal Reduced  R. Abductor pollicis brevis Normal None None None _______ Normal Normal Normal Normal  R. Pronator teres Normal None None None _______ Normal Normal Normal Normal  R. Biceps brachii Normal None None None _______ Normal Normal Normal Normal  R. Deltoid Normal None None None _______ Normal Normal Normal Normal  R. Triceps brachii Normal None None None _______ Normal Normal Normal Normal  L. Abductor digiti minimi (manus) Normal None None None _______ Normal Normal Normal Reduced

## 2024-06-24 ENCOUNTER — Ambulatory Visit: Admission: EM | Admit: 2024-06-24 | Discharge: 2024-06-24 | Disposition: A | Discharge status: Home / Self Care

## 2024-06-24 ENCOUNTER — Encounter: Payer: Self-pay | Admitting: Emergency Medicine

## 2024-06-24 DIAGNOSIS — R591 Generalized enlarged lymph nodes: Secondary | ICD-10-CM | POA: Insufficient documentation

## 2024-06-24 DIAGNOSIS — Z87891 Personal history of nicotine dependence: Secondary | ICD-10-CM | POA: Diagnosis not present

## 2024-06-24 DIAGNOSIS — J029 Acute pharyngitis, unspecified: Secondary | ICD-10-CM | POA: Insufficient documentation

## 2024-06-24 DIAGNOSIS — R590 Localized enlarged lymph nodes: Secondary | ICD-10-CM | POA: Diagnosis not present

## 2024-06-24 DIAGNOSIS — M542 Cervicalgia: Secondary | ICD-10-CM | POA: Insufficient documentation

## 2024-06-24 LAB — POCT RAPID STREP A (OFFICE): Rapid Strep A Screen: NEGATIVE

## 2024-06-24 LAB — POCT MONO SCREEN (KUC): Mono, POC: NEGATIVE

## 2024-06-24 MED ORDER — DICLOFENAC SODIUM 50 MG PO TBEC: 50.0000 mg | DELAYED_RELEASE_TABLET | Freq: Two times a day (BID) | ORAL | 1 refills | Status: AC

## 2024-06-24 NOTE — ED Provider Notes (Signed)
 [facility]  Note:  This document was prepared using Dragon voice recognition software and may include unintentional dictation errors.  MRN: [id number] DOB: [birth year]  Subjective:   Brittany Smith is a 48 y.o. female presenting for right-sided neck pain, swelling, lymph node pain x 1 day.  Patient reports that she began having some sore throat yesterday afternoon.  Today throughout the day at work symptoms have worsened.  Patient has been taking over-the-counter medication with minimal improvement.  Denies any known sick contacts.  No chest pain, shortness of breath, weakness, dizziness.  No current facility-administered medications for this encounter.  Current Outpatient Medications:    diclofenac  (VOLTAREN ) 50 MG EC tablet, Take 1 tablet (50 mg total) by mouth 2 (two) times daily., Disp: 30 tablet, Rfl: 1   gabapentin  (NEURONTIN ) 300 MG capsule, Take 1 capsule (300 mg total) by mouth 3 (three) times daily as needed., Disp: 90 capsule, Rfl: 3   loratadine (CLARITIN) 10 MG tablet, Take 10 mg by mouth daily., Disp: , Rfl:    Allergies  Allergen Reactions   Ceftriaxone  Itching   Tramadol  Nausea And Vomiting and Other (See Comments)    Drowsy     Past Medical History:  Diagnosis Date   Anxiety    Arthritis    Elevated cholesterol    slightly high but no meds recc    Pregnancy induced hypertension    Vaginal Pap smear, abnormal    Vitamin D deficiency      Past Surgical History:  Procedure Laterality Date   ANTERIOR CRUCIATE LIGAMENT REPAIR     BILATERAL SALPINGECTOMY     BREAST BIOPSY Right 09/28/2018   x2   BREAST BIOPSY Left 11/07/2018   x2   KNEE ARTHROSCOPY WITH ANTERIOR CRUCIATE LIGAMENT (ACL) REPAIR     KNEE ARTHROSCOPY WITH LATERAL MENISECTOMY Left 02/22/2019   Procedure: KNEE ARTHROSCOPY WITH LATERAL MENISECTOMY AND CHONDROPLASTY;  Surgeon: Shari Sieving, MD;  Location: [facility];  Service: Orthopedics;  Laterality: Left;    LAPAROSCOPY      they scraped some cells     TUBAL LIGATION     VAGINAL HYSTERECTOMY N/A 02/25/2020   Procedure: HYSTERECTOMY VAGINAL;  Surgeon: Lorence Ozell CROME, MD;  Location: [facility] OR;  Service: Gynecology;  Laterality: N/A;    Family History  Problem Relation Age of Onset   Healthy Mother    Healthy Father     Social History   Tobacco Use   Smoking status: Former    Current packs/day: 0.00    Types: Cigarettes    Quit date: 10/05/2012    Years since quitting: 11.7   Smokeless tobacco: Never  Vaping Use   Vaping status: Never Used  Substance Use Topics   Alcohol use: Yes    Comment: occasionally- weekends   Drug use: No    ROS Refer to HPI for ROS details.  Objective:   Vitals: BP 108/69 (BP Location: Right Arm)   Pulse 70   Temp 99 F (37.2 C) (Oral)   Resp 20   LMP 01/22/2020 (Exact Date)   SpO2 97%   Physical Exam Vitals and nursing note reviewed.  Constitutional:      General: She is not in acute distress.    Appearance: Normal appearance. She is well-developed. She is not ill-appearing or toxic-appearing.  HENT:     Head: Normocephalic and atraumatic.     Mouth/Throat:     Mouth: Mucous membranes are moist.     Pharynx: Posterior  oropharyngeal erythema present. No oropharyngeal exudate.     Tonsils: No tonsillar exudate or tonsillar abscesses.  Eyes:     Extraocular Movements:     Right eye: Normal extraocular motion.     Left eye: Normal extraocular motion.     Conjunctiva/sclera: Conjunctivae normal.  Neck:     Thyroid: No thyromegaly.  Cardiovascular:     Rate and Rhythm: Normal rate.  Pulmonary:     Effort: Pulmonary effort is normal. No respiratory distress.     Breath sounds: No stridor. No wheezing.  Musculoskeletal:        General: Normal range of motion.     Cervical back: Normal range of motion and neck supple.  Lymphadenopathy:     Cervical: Cervical adenopathy present.  Skin:    General: Skin is warm and dry.  Neurological:      General: No focal deficit present.     Mental Status: She is alert and oriented to person, place, and time.  Psychiatric:        Mood and Affect: Mood normal.        Behavior: Behavior normal.     Procedures  Results for orders placed or performed during the hospital encounter of 06/24/24 (from the past 24 hours)  POCT rapid strep A     Status: None   Collection Time: 06/24/24  3:45 PM  Result Value Ref Range   Rapid Strep A Screen Negative Negative  POCT mono screen     Status: None   Collection Time: 06/24/24  4:36 PM  Result Value Ref Range   Mono, POC Negative Negative    No results found.   Assessment and Plan :     Discharge Instructions       1. Lymphadenopathy, anterior cervical (Primary) - POCT rapid strep A performed in UC is negative for strep pharyngitis - Comprehensive metabolic panel and CBC with Differential/Platelet collected in UC and sent to lab for further testing results should be available 1 to 2 days - POCT mono screen is negative for mononucleosis - Culture, group A strep collected and sent to lab for further testing results should be available in 2 to 3 days - diclofenac  (VOLTAREN ) 50 MG EC tablet; Take 1 tablet (50 mg total) by mouth 2 (two) times daily for throat pain and inflammation.  Dispense: 30 tablet; Refill: 1 -Continue to monitor symptoms for any change in severity if there is any escalation of current symptoms or development of new symptoms follow-up in ER for further evaluation and management.      Khloe Hunkele B Mishel Sans   Shondell Poulson, [place]ie[place] B, [place] 06/24/24 [id number]

## 2024-06-24 NOTE — ED Triage Notes (Signed)
 Pt c/o right side lymph node pain and swelling for 1 day.

## 2024-06-24 NOTE — Discharge Instructions (Addendum)
  1. Lymphadenopathy, anterior cervical (Primary) - POCT rapid strep A performed in UC is negative for strep pharyngitis - Comprehensive metabolic panel and CBC with Differential/Platelet collected in UC and sent to lab for further testing results should be available 1 to 2 days - POCT mono screen is negative for mononucleosis - Culture, group A strep collected and sent to lab for further testing results should be available in 2 to 3 days - diclofenac  (VOLTAREN ) 50 MG EC tablet; Take 1 tablet (50 mg total) by mouth 2 (two) times daily for throat pain and inflammation.  Dispense: 30 tablet; Refill: 1 -Continue to monitor symptoms for any change in severity if there is any escalation of current symptoms or development of new symptoms follow-up in ER for further evaluation and management.

## 2024-06-25 ENCOUNTER — Ambulatory Visit (HOSPITAL_COMMUNITY): Payer: Self-pay

## 2024-06-25 LAB — COMPREHENSIVE METABOLIC PANEL WITH GFR
ALT: 7 IU/L (ref 0–32)
AST: 13 IU/L (ref 0–40)
Albumin: 4.3 g/dL (ref 3.9–4.9)
Alkaline Phosphatase: 67 IU/L (ref 41–116)
BUN/Creatinine Ratio: 18 (ref 9–23)
BUN: 12 mg/dL (ref 6–24)
Bilirubin Total: <0.2 mg/dL (ref 0.0–1.2)
CO2: 20 mmol/L (ref 20–29)
Calcium: 9.2 mg/dL (ref 8.7–10.2)
Chloride: 104 mmol/L (ref 96–106)
Creatinine, Ser: 0.65 mg/dL (ref 0.57–1.00)
Globulin, Total: 1.9 g/dL (ref 1.5–4.5)
Glucose: 88 mg/dL (ref 70–99)
Potassium: 4.7 mmol/L (ref 3.5–5.2)
Sodium: 137 mmol/L (ref 134–144)
Total Protein: 6.2 g/dL (ref 6.0–8.5)
eGFR: 109 mL/min/1.73 (ref >59)

## 2024-06-25 LAB — CBC WITH DIFFERENTIAL/PLATELET
Basophils Absolute: 0.1 x10E3/uL (ref 0.0–0.2)
Basos: 1 %
EOS (ABSOLUTE): 0.4 x10E3/uL (ref 0.0–0.4)
Eos: 4 %
Hematocrit: 43.5 % (ref 34.0–46.6)
Hemoglobin: 13.9 g/dL (ref 11.1–15.9)
Immature Grans (Abs): 0 x10E3/uL (ref 0.0–0.1)
Immature Granulocytes: 0 %
Lymphocytes Absolute: 2.7 x10E3/uL (ref 0.7–3.1)
Lymphs: 25 %
MCH: 28.5 pg (ref 26.6–33.0)
MCHC: 32 g/dL (ref 31.5–35.7)
MCV: 89 fL (ref 79–97)
Monocytes Absolute: 1.1 x10E3/uL — ABNORMAL HIGH (ref 0.1–0.9)
Monocytes: 10 %
Neutrophils Absolute: 6.4 x10E3/uL (ref 1.4–7.0)
Neutrophils: 60 %
Platelets: 306 x10E3/uL (ref 150–450)
RBC: 4.87 x10E6/uL (ref 3.77–5.28)
RDW: 14.3 % (ref 11.7–15.4)
WBC: 10.7 x10E3/uL (ref 3.4–10.8)

## 2024-06-27 LAB — CULTURE, GROUP A STREP (THRC)
